# Patient Record
Sex: Female | Born: 1935 | Race: White | Hispanic: No | Smoking: Former smoker
Health system: Southern US, Community
[De-identification: ages and names within clinical notes are randomized; demographics above are authoritative.]

## PROBLEM LIST (undated history)

## (undated) DIAGNOSIS — I10 Essential (primary) hypertension: Secondary | ICD-10-CM

## (undated) DIAGNOSIS — IMO0002 Reserved for concepts with insufficient information to code with codable children: Secondary | ICD-10-CM

## (undated) DIAGNOSIS — Z95828 Presence of other vascular implants and grafts: Secondary | ICD-10-CM

## (undated) DIAGNOSIS — K222 Esophageal obstruction: Secondary | ICD-10-CM

## (undated) DIAGNOSIS — R0989 Other specified symptoms and signs involving the circulatory and respiratory systems: Secondary | ICD-10-CM

## (undated) DIAGNOSIS — I719 Aortic aneurysm of unspecified site, without rupture: Secondary | ICD-10-CM

## (undated) DIAGNOSIS — G43909 Migraine, unspecified, not intractable, without status migrainosus: Secondary | ICD-10-CM

## (undated) DIAGNOSIS — Z86718 Personal history of other venous thrombosis and embolism: Secondary | ICD-10-CM

## (undated) DIAGNOSIS — M109 Gout, unspecified: Secondary | ICD-10-CM

## (undated) DIAGNOSIS — K859 Acute pancreatitis without necrosis or infection, unspecified: Secondary | ICD-10-CM

## (undated) DIAGNOSIS — E785 Hyperlipidemia, unspecified: Secondary | ICD-10-CM

## (undated) DIAGNOSIS — I253 Aneurysm of heart: Secondary | ICD-10-CM

## (undated) DIAGNOSIS — H269 Unspecified cataract: Secondary | ICD-10-CM

## (undated) DIAGNOSIS — I639 Cerebral infarction, unspecified: Secondary | ICD-10-CM

## (undated) DIAGNOSIS — I6529 Occlusion and stenosis of unspecified carotid artery: Secondary | ICD-10-CM

## (undated) DIAGNOSIS — M545 Low back pain, unspecified: Secondary | ICD-10-CM

## (undated) DIAGNOSIS — I219 Acute myocardial infarction, unspecified: Secondary | ICD-10-CM

## (undated) DIAGNOSIS — K219 Gastro-esophageal reflux disease without esophagitis: Secondary | ICD-10-CM

## (undated) DIAGNOSIS — H5440 Blindness, one eye, unspecified eye: Secondary | ICD-10-CM

## (undated) DIAGNOSIS — K279 Peptic ulcer, site unspecified, unspecified as acute or chronic, without hemorrhage or perforation: Secondary | ICD-10-CM

## (undated) HISTORY — DX: Aneurysm of heart: I25.3

## (undated) HISTORY — DX: Acute pancreatitis without necrosis or infection, unspecified: K85.90

## (undated) HISTORY — DX: Migraine, unspecified, not intractable, without status migrainosus: G43.909

## (undated) HISTORY — DX: Essential (primary) hypertension: I10

## (undated) HISTORY — DX: Presence of other vascular implants and grafts: Z95.828

## (undated) HISTORY — PX: STOMACH SURGERY: SHX791

## (undated) HISTORY — PX: APPENDECTOMY: SHX54

## (undated) HISTORY — DX: Esophageal obstruction: K22.2

## (undated) HISTORY — DX: Gastro-esophageal reflux disease without esophagitis: K21.9

## (undated) HISTORY — DX: Other specified symptoms and signs involving the circulatory and respiratory systems: R09.89

## (undated) HISTORY — PX: CORONARY ARTERY BYPASS GRAFT: SHX141

## (undated) HISTORY — DX: Acute myocardial infarction, unspecified: I21.9

## (undated) HISTORY — DX: Cerebral infarction, unspecified: I63.9

## (undated) HISTORY — DX: Low back pain, unspecified: M54.50

## (undated) HISTORY — DX: Hyperlipidemia, unspecified: E78.5

## (undated) HISTORY — DX: Aortic aneurysm of unspecified site, without rupture: I71.9

## (undated) HISTORY — DX: Occlusion and stenosis of unspecified carotid artery: I65.29

## (undated) HISTORY — DX: Peptic ulcer, site unspecified, unspecified as acute or chronic, without hemorrhage or perforation: K27.9

## (undated) HISTORY — DX: Reserved for concepts with insufficient information to code with codable children: IMO0002

## (undated) HISTORY — DX: Blindness, one eye, unspecified eye: H54.40

## (undated) HISTORY — DX: Unspecified cataract: H26.9

## (undated) HISTORY — PX: ABDOMINAL HYSTERECTOMY: SHX81

## (undated) HISTORY — DX: Low back pain: M54.5

## (undated) HISTORY — PX: EYE SURGERY: SHX253

---

## 1964-12-10 HISTORY — PX: PARTIAL HYSTERECTOMY: SHX80

## 1991-12-11 HISTORY — PX: LUMBAR DISC SURGERY: SHX700

## 1994-12-10 HISTORY — PX: ESOPHAGEAL DILATION: SHX303

## 1998-04-04 ENCOUNTER — Inpatient Hospital Stay (HOSPITAL_COMMUNITY): Admission: EM | Admit: 1998-04-04 | Discharge: 1998-04-23 | Payer: Self-pay | Admitting: *Deleted

## 1998-04-09 HISTORY — PX: OTHER SURGICAL HISTORY: SHX169

## 1998-06-09 HISTORY — PX: AORTA - BILATERAL FEMORAL ARTERY BYPASS GRAFT: SHX1175

## 1998-06-14 ENCOUNTER — Ambulatory Visit (HOSPITAL_COMMUNITY): Admission: RE | Admit: 1998-06-14 | Discharge: 1998-06-14 | Payer: Self-pay | Admitting: *Deleted

## 1998-06-27 ENCOUNTER — Inpatient Hospital Stay (HOSPITAL_COMMUNITY): Admission: RE | Admit: 1998-06-27 | Discharge: 1998-07-02 | Payer: Self-pay | Admitting: *Deleted

## 1999-03-29 ENCOUNTER — Encounter: Admission: RE | Admit: 1999-03-29 | Discharge: 1999-06-27 | Payer: Self-pay | Admitting: Family Medicine

## 2000-05-07 ENCOUNTER — Encounter: Payer: Self-pay | Admitting: Emergency Medicine

## 2000-05-07 ENCOUNTER — Encounter: Payer: Self-pay | Admitting: *Deleted

## 2000-05-07 ENCOUNTER — Inpatient Hospital Stay (HOSPITAL_COMMUNITY): Admission: EM | Admit: 2000-05-07 | Discharge: 2000-05-14 | Payer: Self-pay | Admitting: Emergency Medicine

## 2000-05-08 ENCOUNTER — Encounter: Payer: Self-pay | Admitting: Cardiology

## 2000-05-13 ENCOUNTER — Encounter: Payer: Self-pay | Admitting: Cardiology

## 2001-08-27 ENCOUNTER — Ambulatory Visit (HOSPITAL_COMMUNITY): Admission: RE | Admit: 2001-08-27 | Discharge: 2001-08-27 | Payer: Self-pay | Admitting: Gastroenterology

## 2002-05-27 ENCOUNTER — Encounter: Payer: Self-pay | Admitting: *Deleted

## 2002-05-29 ENCOUNTER — Ambulatory Visit (HOSPITAL_COMMUNITY): Admission: RE | Admit: 2002-05-29 | Discharge: 2002-05-29 | Payer: Self-pay | Admitting: *Deleted

## 2002-06-09 HISTORY — PX: CAROTID ENDARTERECTOMY: SUR193

## 2002-06-10 ENCOUNTER — Inpatient Hospital Stay (HOSPITAL_COMMUNITY): Admission: RE | Admit: 2002-06-10 | Discharge: 2002-06-11 | Payer: Self-pay | Admitting: *Deleted

## 2002-06-10 ENCOUNTER — Encounter: Payer: Self-pay | Admitting: *Deleted

## 2002-06-10 ENCOUNTER — Encounter (INDEPENDENT_AMBULATORY_CARE_PROVIDER_SITE_OTHER): Payer: Self-pay | Admitting: *Deleted

## 2002-10-20 ENCOUNTER — Other Ambulatory Visit: Admission: RE | Admit: 2002-10-20 | Discharge: 2002-10-20 | Payer: Self-pay | Admitting: Family Medicine

## 2003-04-20 ENCOUNTER — Ambulatory Visit (HOSPITAL_COMMUNITY): Admission: RE | Admit: 2003-04-20 | Discharge: 2003-04-20 | Payer: Self-pay | Admitting: Cardiology

## 2003-04-20 ENCOUNTER — Encounter: Payer: Self-pay | Admitting: Cardiology

## 2003-04-20 ENCOUNTER — Encounter (INDEPENDENT_AMBULATORY_CARE_PROVIDER_SITE_OTHER): Payer: Self-pay | Admitting: Cardiology

## 2003-05-05 ENCOUNTER — Ambulatory Visit (HOSPITAL_COMMUNITY): Admission: RE | Admit: 2003-05-05 | Discharge: 2003-05-05 | Payer: Self-pay | Admitting: Cardiology

## 2003-08-03 ENCOUNTER — Ambulatory Visit: Admission: RE | Admit: 2003-08-03 | Discharge: 2003-08-03 | Payer: Self-pay | Admitting: Internal Medicine

## 2003-08-23 ENCOUNTER — Encounter: Payer: Self-pay | Admitting: Family Medicine

## 2003-08-23 ENCOUNTER — Encounter: Payer: Self-pay | Admitting: Emergency Medicine

## 2003-08-24 ENCOUNTER — Encounter: Payer: Self-pay | Admitting: Family Medicine

## 2003-08-24 ENCOUNTER — Inpatient Hospital Stay (HOSPITAL_COMMUNITY): Admission: EM | Admit: 2003-08-24 | Discharge: 2003-08-28 | Payer: Self-pay | Admitting: Emergency Medicine

## 2003-08-25 ENCOUNTER — Encounter: Payer: Self-pay | Admitting: Internal Medicine

## 2003-08-25 ENCOUNTER — Encounter: Payer: Self-pay | Admitting: Family Medicine

## 2003-08-27 ENCOUNTER — Encounter: Payer: Self-pay | Admitting: Family Medicine

## 2003-09-01 ENCOUNTER — Encounter: Admission: RE | Admit: 2003-09-01 | Discharge: 2003-09-01 | Payer: Self-pay | Admitting: Family Medicine

## 2004-01-14 ENCOUNTER — Emergency Department (HOSPITAL_COMMUNITY): Admission: EM | Admit: 2004-01-14 | Discharge: 2004-01-15 | Payer: Self-pay | Admitting: Emergency Medicine

## 2004-01-24 ENCOUNTER — Ambulatory Visit (HOSPITAL_COMMUNITY): Admission: RE | Admit: 2004-01-24 | Discharge: 2004-01-24 | Payer: Self-pay | Admitting: Gastroenterology

## 2004-02-03 ENCOUNTER — Ambulatory Visit (HOSPITAL_COMMUNITY): Admission: RE | Admit: 2004-02-03 | Discharge: 2004-02-03 | Payer: Self-pay | Admitting: Gastroenterology

## 2004-02-21 ENCOUNTER — Inpatient Hospital Stay (HOSPITAL_COMMUNITY): Admission: RE | Admit: 2004-02-21 | Discharge: 2004-02-25 | Payer: Self-pay | Admitting: General Surgery

## 2004-02-21 ENCOUNTER — Encounter (INDEPENDENT_AMBULATORY_CARE_PROVIDER_SITE_OTHER): Payer: Self-pay | Admitting: Specialist

## 2004-10-10 ENCOUNTER — Other Ambulatory Visit: Admission: RE | Admit: 2004-10-10 | Discharge: 2004-10-10 | Payer: Self-pay | Admitting: Family Medicine

## 2004-11-08 ENCOUNTER — Inpatient Hospital Stay (HOSPITAL_COMMUNITY): Admission: EM | Admit: 2004-11-08 | Discharge: 2004-11-14 | Payer: Self-pay | Admitting: Emergency Medicine

## 2004-11-08 ENCOUNTER — Ambulatory Visit: Payer: Self-pay | Admitting: Family Medicine

## 2005-07-05 ENCOUNTER — Encounter: Admission: RE | Admit: 2005-07-05 | Discharge: 2005-07-05 | Payer: Self-pay | Admitting: Neurosurgery

## 2005-07-12 ENCOUNTER — Encounter: Admission: RE | Admit: 2005-07-12 | Discharge: 2005-07-12 | Payer: Self-pay | Admitting: *Deleted

## 2007-04-17 ENCOUNTER — Ambulatory Visit: Payer: Self-pay | Admitting: *Deleted

## 2007-04-23 ENCOUNTER — Encounter: Admission: RE | Admit: 2007-04-23 | Discharge: 2007-04-23 | Payer: Self-pay | Admitting: Cardiology

## 2007-05-21 ENCOUNTER — Encounter: Admission: RE | Admit: 2007-05-21 | Discharge: 2007-08-19 | Payer: Self-pay | Admitting: Cardiology

## 2007-10-16 ENCOUNTER — Ambulatory Visit: Payer: Self-pay | Admitting: *Deleted

## 2007-10-30 ENCOUNTER — Encounter: Admission: RE | Admit: 2007-10-30 | Discharge: 2007-10-30 | Payer: Self-pay | Admitting: *Deleted

## 2007-10-30 ENCOUNTER — Ambulatory Visit: Payer: Self-pay | Admitting: *Deleted

## 2007-11-18 ENCOUNTER — Inpatient Hospital Stay (HOSPITAL_COMMUNITY): Admission: AD | Admit: 2007-11-18 | Discharge: 2007-11-20 | Payer: Self-pay | Admitting: *Deleted

## 2007-11-18 ENCOUNTER — Encounter (INDEPENDENT_AMBULATORY_CARE_PROVIDER_SITE_OTHER): Payer: Self-pay | Admitting: *Deleted

## 2007-11-18 ENCOUNTER — Ambulatory Visit: Payer: Self-pay | Admitting: *Deleted

## 2007-11-18 HISTORY — PX: CAROTID ENDARTERECTOMY: SUR193

## 2008-04-28 ENCOUNTER — Encounter: Admission: RE | Admit: 2008-04-28 | Discharge: 2008-04-28 | Payer: Self-pay | Admitting: Family Medicine

## 2008-05-20 ENCOUNTER — Ambulatory Visit: Payer: Self-pay | Admitting: *Deleted

## 2008-11-25 ENCOUNTER — Ambulatory Visit: Payer: Self-pay | Admitting: *Deleted

## 2009-04-29 ENCOUNTER — Encounter: Admission: RE | Admit: 2009-04-29 | Discharge: 2009-04-29 | Payer: Self-pay | Admitting: Family Medicine

## 2009-05-16 ENCOUNTER — Encounter: Admission: RE | Admit: 2009-05-16 | Discharge: 2009-05-16 | Payer: Self-pay | Admitting: Cardiology

## 2009-11-14 ENCOUNTER — Ambulatory Visit: Payer: Self-pay | Admitting: Surgery

## 2010-03-02 ENCOUNTER — Ambulatory Visit: Payer: Self-pay | Admitting: Vascular Surgery

## 2010-03-07 ENCOUNTER — Ambulatory Visit (HOSPITAL_COMMUNITY): Admission: RE | Admit: 2010-03-07 | Discharge: 2010-03-07 | Payer: Self-pay | Admitting: Vascular Surgery

## 2010-03-07 ENCOUNTER — Ambulatory Visit: Payer: Self-pay | Admitting: Vascular Surgery

## 2010-03-10 ENCOUNTER — Inpatient Hospital Stay (HOSPITAL_COMMUNITY): Admission: EM | Admit: 2010-03-10 | Discharge: 2010-03-14 | Payer: Self-pay | Admitting: Emergency Medicine

## 2010-12-21 ENCOUNTER — Ambulatory Visit: Admit: 2010-12-21 | Payer: Self-pay | Admitting: Vascular Surgery

## 2011-01-03 ENCOUNTER — Encounter
Admission: RE | Admit: 2011-01-03 | Discharge: 2011-01-09 | Payer: Self-pay | Source: Home / Self Care | Attending: Orthopedic Surgery | Admitting: Orthopedic Surgery

## 2011-01-03 ENCOUNTER — Encounter
Admission: RE | Admit: 2011-01-03 | Discharge: 2011-01-03 | Payer: Self-pay | Source: Home / Self Care | Attending: Orthopedic Surgery | Admitting: Orthopedic Surgery

## 2011-01-11 ENCOUNTER — Ambulatory Visit: Payer: Medicare Other | Attending: Orthopedic Surgery | Admitting: Physical Therapy

## 2011-01-11 DIAGNOSIS — IMO0001 Reserved for inherently not codable concepts without codable children: Secondary | ICD-10-CM | POA: Insufficient documentation

## 2011-01-11 DIAGNOSIS — R5381 Other malaise: Secondary | ICD-10-CM | POA: Insufficient documentation

## 2011-01-11 DIAGNOSIS — M25519 Pain in unspecified shoulder: Secondary | ICD-10-CM | POA: Insufficient documentation

## 2011-01-11 DIAGNOSIS — M25619 Stiffness of unspecified shoulder, not elsewhere classified: Secondary | ICD-10-CM | POA: Insufficient documentation

## 2011-01-15 ENCOUNTER — Ambulatory Visit: Payer: Medicare Other | Admitting: Physical Therapy

## 2011-01-17 ENCOUNTER — Ambulatory Visit: Payer: Medicare Other | Admitting: Physical Therapy

## 2011-01-23 ENCOUNTER — Ambulatory Visit: Payer: Medicare Other | Admitting: Physical Therapy

## 2011-01-25 ENCOUNTER — Ambulatory Visit: Payer: Medicare Other | Admitting: Physical Therapy

## 2011-01-29 ENCOUNTER — Ambulatory Visit: Payer: Medicare Other | Admitting: Physical Therapy

## 2011-01-31 ENCOUNTER — Ambulatory Visit: Payer: Medicare Other | Admitting: Physical Therapy

## 2011-02-05 ENCOUNTER — Ambulatory Visit: Payer: Medicare Other | Admitting: Physical Therapy

## 2011-02-07 ENCOUNTER — Ambulatory Visit: Payer: Medicare Other | Admitting: Physical Therapy

## 2011-02-13 ENCOUNTER — Ambulatory Visit: Payer: Medicare Other | Attending: Orthopedic Surgery | Admitting: Physical Therapy

## 2011-02-13 DIAGNOSIS — IMO0001 Reserved for inherently not codable concepts without codable children: Secondary | ICD-10-CM | POA: Insufficient documentation

## 2011-02-13 DIAGNOSIS — M25619 Stiffness of unspecified shoulder, not elsewhere classified: Secondary | ICD-10-CM | POA: Insufficient documentation

## 2011-02-13 DIAGNOSIS — R5381 Other malaise: Secondary | ICD-10-CM | POA: Insufficient documentation

## 2011-02-13 DIAGNOSIS — M25519 Pain in unspecified shoulder: Secondary | ICD-10-CM | POA: Insufficient documentation

## 2011-02-15 ENCOUNTER — Ambulatory Visit: Payer: Medicare Other | Admitting: Physical Therapy

## 2011-02-19 ENCOUNTER — Ambulatory Visit: Payer: Medicare Other | Admitting: Physical Therapy

## 2011-02-21 ENCOUNTER — Ambulatory Visit: Payer: Medicare Other | Admitting: Physical Therapy

## 2011-02-26 ENCOUNTER — Ambulatory Visit: Payer: Medicare Other | Admitting: Physical Therapy

## 2011-02-28 ENCOUNTER — Ambulatory Visit: Payer: Medicare Other | Admitting: Physical Therapy

## 2011-02-28 LAB — PROTIME-INR
INR: 1.33 (ref 0.00–1.49)
INR: 1.41 (ref 0.00–1.49)
INR: 2.12 — ABNORMAL HIGH (ref 0.00–1.49)
INR: 2.9 — ABNORMAL HIGH (ref 0.00–1.49)
Prothrombin Time: 16.4 seconds — ABNORMAL HIGH (ref 11.6–15.2)
Prothrombin Time: 25.3 seconds — ABNORMAL HIGH (ref 11.6–15.2)
Prothrombin Time: 30.1 seconds — ABNORMAL HIGH (ref 11.6–15.2)

## 2011-02-28 LAB — POCT I-STAT, CHEM 8
Glucose, Bld: 96 mg/dL (ref 70–99)
HCT: 40 % (ref 36.0–46.0)
Hemoglobin: 13.6 g/dL (ref 12.0–15.0)
Potassium: 2.9 mEq/L — ABNORMAL LOW (ref 3.5–5.1)

## 2011-02-28 LAB — BLOOD GAS, ARTERIAL
Bicarbonate: 18.5 mEq/L — ABNORMAL LOW (ref 20.0–24.0)
O2 Content: 2 L/min
Patient temperature: 98.6
pH, Arterial: 7.56 — ABNORMAL HIGH (ref 7.350–7.400)
pO2, Arterial: 102 mmHg — ABNORMAL HIGH (ref 80.0–100.0)

## 2011-02-28 LAB — CBC
HCT: 33 % — ABNORMAL LOW (ref 36.0–46.0)
Hemoglobin: 10.9 g/dL — ABNORMAL LOW (ref 12.0–15.0)
Hemoglobin: 12 g/dL (ref 12.0–15.0)
MCHC: 34 g/dL (ref 30.0–36.0)
MCHC: 34 g/dL (ref 30.0–36.0)
MCHC: 34.2 g/dL (ref 30.0–36.0)
Platelets: 173 10*3/uL (ref 150–400)
RBC: 3.5 MIL/uL — ABNORMAL LOW (ref 3.87–5.11)
RBC: 3.86 MIL/uL — ABNORMAL LOW (ref 3.87–5.11)
RBC: 4.37 MIL/uL (ref 3.87–5.11)
RDW: 14.5 % (ref 11.5–15.5)
RDW: 14.8 % (ref 11.5–15.5)
WBC: 12.5 10*3/uL — ABNORMAL HIGH (ref 4.0–10.5)

## 2011-02-28 LAB — DIFFERENTIAL
Basophils Relative: 2 % — ABNORMAL HIGH (ref 0–1)
Lymphocytes Relative: 8 % — ABNORMAL LOW (ref 12–46)
Lymphs Abs: 1 10*3/uL (ref 0.7–4.0)
Monocytes Relative: 7 % (ref 3–12)
Neutro Abs: 10.4 10*3/uL — ABNORMAL HIGH (ref 1.7–7.7)
Neutrophils Relative %: 83 % — ABNORMAL HIGH (ref 43–77)

## 2011-02-28 LAB — CARDIAC PANEL(CRET KIN+CKTOT+MB+TROPI)
CK, MB: 1.4 ng/mL (ref 0.3–4.0)
CK, MB: 1.5 ng/mL (ref 0.3–4.0)
CK, MB: 1.7 ng/mL (ref 0.3–4.0)
Relative Index: INVALID (ref 0.0–2.5)
Total CK: 41 U/L (ref 7–177)
Total CK: 44 U/L (ref 7–177)
Troponin I: 0.02 ng/mL (ref 0.00–0.06)
Troponin I: 0.03 ng/mL (ref 0.00–0.06)

## 2011-02-28 LAB — COMPREHENSIVE METABOLIC PANEL
AST: 22 U/L (ref 0–37)
Albumin: 2.9 g/dL — ABNORMAL LOW (ref 3.5–5.2)
Calcium: 8.5 mg/dL (ref 8.4–10.5)
Chloride: 114 mEq/L — ABNORMAL HIGH (ref 96–112)
Creatinine, Ser: 1 mg/dL (ref 0.4–1.2)
GFR calc Af Amer: >60 mL/min (ref >60)
Total Protein: 5 g/dL — ABNORMAL LOW (ref 6.0–8.3)

## 2011-02-28 LAB — GLUCOSE, CAPILLARY: Glucose-Capillary: 82 mg/dL (ref 70–99)

## 2011-02-28 LAB — CK TOTAL AND CKMB (NOT AT ARMC): CK, MB: 1.8 ng/mL (ref 0.3–4.0)

## 2011-02-28 LAB — BASIC METABOLIC PANEL
BUN: 13 mg/dL (ref 6–23)
Chloride: 110 mEq/L (ref 96–112)
Creatinine, Ser: 0.83 mg/dL (ref 0.4–1.2)
Glucose, Bld: 94 mg/dL (ref 70–99)

## 2011-02-28 LAB — ANA: Anti Nuclear Antibody(ANA): NEGATIVE

## 2011-03-02 LAB — POCT I-STAT 4, (NA,K, GLUC, HGB,HCT)
HCT: 46 % (ref 36.0–46.0)
Hemoglobin: 15.6 g/dL — ABNORMAL HIGH (ref 12.0–15.0)
Potassium: 4.3 mEq/L (ref 3.5–5.1)
Sodium: 140 mEq/L (ref 135–145)

## 2011-03-02 LAB — PROTIME-INR: Prothrombin Time: 18.8 seconds — ABNORMAL HIGH (ref 11.6–15.2)

## 2011-03-05 ENCOUNTER — Ambulatory Visit: Payer: Medicare Other | Admitting: Physical Therapy

## 2011-03-08 ENCOUNTER — Ambulatory Visit: Payer: Medicare Other | Admitting: Physical Therapy

## 2011-03-12 ENCOUNTER — Ambulatory Visit: Payer: Medicare Other | Attending: Orthopedic Surgery | Admitting: Physical Therapy

## 2011-03-12 DIAGNOSIS — IMO0001 Reserved for inherently not codable concepts without codable children: Secondary | ICD-10-CM | POA: Insufficient documentation

## 2011-03-12 DIAGNOSIS — M25519 Pain in unspecified shoulder: Secondary | ICD-10-CM | POA: Insufficient documentation

## 2011-03-12 DIAGNOSIS — R5381 Other malaise: Secondary | ICD-10-CM | POA: Insufficient documentation

## 2011-03-12 DIAGNOSIS — M25619 Stiffness of unspecified shoulder, not elsewhere classified: Secondary | ICD-10-CM | POA: Insufficient documentation

## 2011-03-14 ENCOUNTER — Ambulatory Visit: Payer: Medicare Other | Admitting: Physical Therapy

## 2011-03-19 ENCOUNTER — Ambulatory Visit: Payer: Medicare Other | Admitting: Physical Therapy

## 2011-03-21 ENCOUNTER — Ambulatory Visit: Payer: Medicare Other | Admitting: Physical Therapy

## 2011-03-26 ENCOUNTER — Ambulatory Visit: Payer: Medicare Other | Admitting: Physical Therapy

## 2011-03-28 ENCOUNTER — Ambulatory Visit: Payer: Medicare Other | Admitting: Physical Therapy

## 2011-04-02 ENCOUNTER — Ambulatory Visit: Payer: Medicare Other | Admitting: Physical Therapy

## 2011-04-04 ENCOUNTER — Ambulatory Visit: Payer: Medicare Other | Admitting: Physical Therapy

## 2011-04-09 ENCOUNTER — Ambulatory Visit: Payer: Medicare Other | Admitting: Physical Therapy

## 2011-04-11 ENCOUNTER — Ambulatory Visit: Payer: Medicare Other | Attending: Orthopedic Surgery | Admitting: Physical Therapy

## 2011-04-11 DIAGNOSIS — IMO0001 Reserved for inherently not codable concepts without codable children: Secondary | ICD-10-CM | POA: Insufficient documentation

## 2011-04-11 DIAGNOSIS — M25619 Stiffness of unspecified shoulder, not elsewhere classified: Secondary | ICD-10-CM | POA: Insufficient documentation

## 2011-04-11 DIAGNOSIS — R5381 Other malaise: Secondary | ICD-10-CM | POA: Insufficient documentation

## 2011-04-11 DIAGNOSIS — M25519 Pain in unspecified shoulder: Secondary | ICD-10-CM | POA: Insufficient documentation

## 2011-04-16 ENCOUNTER — Ambulatory Visit: Payer: Medicare Other | Admitting: Physical Therapy

## 2011-04-18 ENCOUNTER — Ambulatory Visit: Payer: Medicare Other | Admitting: Physical Therapy

## 2011-04-23 ENCOUNTER — Ambulatory Visit: Payer: Medicare Other | Admitting: Physical Therapy

## 2011-04-24 NOTE — Procedures (Signed)
CAROTID DUPLEX EXAM   INDICATION:  Followup carotid artery disease.   HISTORY:  Diabetes:  Cardiac:  MI.  Hypertension:  Yes.  Smoking:  Quit.  Previous Surgery:  Left CEA with DPA and left CCA to SCA bypass graft in  July of 2003.  Redo left CEA with DPA 11/18/2007 by Dr. Madilyn Fireman.  CV History:  Right eye total blindness.  Amaurosis Fugax No, Paresthesias No, Hemiparesis No                                       RIGHT             LEFT  Brachial systolic pressure:         182               184  Brachial Doppler waveforms:         WNL               WNL  Vertebral direction of flow:        Antegrade         Antegrade  DUPLEX VELOCITIES (cm/sec)  CCA peak systolic                   61                M=93  ECA peak systolic                   58 (proximal occlusion)             108  ICA peak systolic                   130               67  ICA end diastolic                   27                15  PLAQUE MORPHOLOGY:                  Calcific with shadow                N/A  PLAQUE AMOUNT:                      Mild              N/A  PLAQUE LOCATION:                    ICA/ECA/CCA       N/A   IMPRESSION:  1. Right ICA shows evidence of 40-59% stenosis with heavy calcific      plaque and acoustic shadowing in the proximal ICA.  2. Left ICA shows no evidence of restenosis status post CEA.  3. Right ECA proximal occlusion with mid/distal fill via collaterals.  4. Patent left CCA to SCA bypass graft.  5. No significant changes from previous study.   ___________________________________________  P. Liliane Bade, M.D.   AS/MEDQ  D:  11/25/2008  T:  11/25/2008  Job:  [id number]

## 2011-04-24 NOTE — Assessment & Plan Note (Signed)
OFFICE VISIT   [name], [name]  DOB:  08/20/1932                                       11/25/2008  YQMVH#:[id number]   The patient continues to follow up with me regarding her cerebrovascular  disease.  She has undergone a left carotid endarterectomy and left  carotid subclavian bypass along with a redo left carotid endarterectomy.  She has had no recent neurologic symptoms.  Denies sensory, motor or  visual deficit.   Carotid Doppler reveals 40 59% right ICA stenosis which is stable.  Left  carotid reveals no evidence of restenosis status post redo  endarterectomy and patent left carotid subclavian bypass.   The patient appears generally well.  No acute distress.  Alert and  oriented.  BP 142/71, pulse 56 per minute.  Soft left carotid bruit  audible.  Cranial nerves intact.  Strength equal bilaterally.  Reflexes  1+.   The patient continues to do well without significant recurrence of  cerebrovascular disease.  We will plan follow-up again in 1 year with  carotid Doppler evaluation.   Balinda Quails, M.D.  Electronically Signed   PGH/MEDQ  D:  11/25/2008  T:  11/26/2008  Job:  [id number]   cc:   Ernestina Penna, M.D.  Georga Hacking, M.D.

## 2011-04-24 NOTE — Procedures (Signed)
CAROTID DUPLEX EXAM   INDICATION:  Followup carotid artery disease.   HISTORY:  Diabetes:  No.  Cardiac:  MI.  Hypertension:  Yes.  Smoking:  Quit.  Previous Surgery:  Left CEA with DPA and left CCA to SCA bypass graft in  July of 2003.  Re-do left CEA with DPA 11/18/2007 by Dr. Madilyn Fireman.  CV History:  Right eye total blindness, last week had 2 days of  migraines and blurry vision.  Amaurosis Fugax No, Paresthesias No, Hemiparesis No                                       RIGHT             LEFT  Brachial systolic pressure:         202               195  Brachial Doppler waveforms:         Triphasic         Triphasic  Vertebral direction of flow:        Antegrade         Antegrade  DUPLEX VELOCITIES (cm/sec)  CCA peak systolic                   57                81  ECA peak systolic                   21 (proximal occlusion)             133  ICA peak systolic                   128               84  ICA end diastolic                   34                19  PLAQUE MORPHOLOGY:                  Calcific          N/A  PLAQUE AMOUNT:                      Mild/moderate     N/A  PLAQUE LOCATION:                    ICA/ECA/CCA       N/A   IMPRESSION:  1. Right ICA shows evidence of 40-59% (low end of range).  2. Left ICA shows no evidence of restenosis status post CEA.  3. Right ECA proximal occlusion, however, fills distally by      collaterals.  4. Patent left CCA to SCA bypass graft.   ___________________________________________  P. Liliane Bade, M.D.   AS/MEDQ  D:  05/20/2008  T:  05/20/2008  Job:  [id number]

## 2011-04-24 NOTE — Assessment & Plan Note (Signed)
OFFICE VISIT   [name], [name]  DOB:  [birth year]                                       03/02/2010  ZOXWR#:[id number]   I saw patient in the office today to evaluate her for possible temporal  artery biopsy.  She was referred by Dr. Christell Constant.  This is a pleasant 77-  year-old woman who states that approximately a month ago she began  experiencing some pain in her left forehead and left temporal area which  radiates down to her left shoulder.  The pain came on gradually, and it  has gradually progressed over the last month.  There really are no  aggravating or alleviating factors except pain medication helps some.  She has been recently started on prednisone, and this has not helped her  pain significantly.  She has had no associated symptoms.  She was sent  for evaluation for a left temporal artery biopsy.   Her past medical history is significant for hypertension and  hypercholesterolemia, both of which have been stable on her current  medications.  She had a previous myocardial infarction back in 1999 with  a left ventricular aneurysm, and she has been on Coumadin since that  time.   On social history, she is married.  Her children are deceased.  She quit  tobacco in 1999.   REVIEW OF SYSTEMS:  CARDIOVASCULAR:  She had no chest pain, chest  pressure, palpitations or arrhythmias.  She does admit to dyspnea on  exertion.  She denies any claudication.  She has had no history of DVT  or phlebitis.  PULMONARY:  She has had no productive cough, bronchitis, asthma, or  wheezing.  GI:  She does have a history of reflux and occasional problems  swallowing.  She has a hiatal hernia.   PHYSICAL EXAMINATION:  This is a pleasant 77 year old woman who appears  her stated age.  Blood pressure is 171/81.  Heart rate is 76.  Temperature is 98.  HEENT:  She has minimal tenderness over the left  temporal artery.  HEENT is otherwise unremarkable.  Lungs are clear  bilaterally to auscultation without rales, rhonchi or wheezing.  On  cardiovascular exam, she has bilateral carotid bruits.  She has a  regular rate and rhythm with a systolic murmur.  She has no significant  peripheral edema.  She has palpable radial pulses.  Neurologic exam:  She has no focal weakness or paresthesias.  Her abdomen is soft and  nontender with normal-pitched bowel sounds.   Of note, she has had a previous left carotid endarterectomy by Dr.  Madilyn Fireman, and her most recent carotid duplex scan, which was done in  December 2010, showed a 40-59% right carotid stenosis with no evidence  of recurrent stenosis on the left.   Given her symptoms, I would agree that temporal artery biopsy is  indicated.  As her symptoms are limited to the left side, I think it  would be reasonable to do a left temporal artery biopsy only.  We have  discussed the procedure and potential complications in the office today.  Of note, she is on Coumadin, and I discussed this today with Dr. Donnie Aho.  We agreed that it would be safe to stop her Coumadin prior to her  surgery without covering her with Lovenox.  We will restart her Coumadin  right after  surgery.  Her biopsy has been scheduled for 03/07/2010.     Di Kindle. Edilia Bo, M.D.  Electronically Signed   CSD/MEDQ  D:  03/02/2010  T:  03/02/2010  Job:  [id number]   cc:   Ernestina Penna, M.D.  Georga Hacking, M.D.

## 2011-04-24 NOTE — Procedures (Signed)
CAROTID DUPLEX EXAM   INDICATION:  Carotid disease.   HISTORY:  Diabetes:  No.  Cardiac:  MI.  Hypertension:  Yes.  Smoking:  Previous.  Previous Surgery:  Left carotid endarterectomy and left CCA to  subclavian artery bypass graft in July, 2003.  Re-do of left carotid  endarterectomy on 11/18/07 by Dr. Madilyn Fireman.  CV History:  Right eye blindness.  Amaurosis Fugax No, Paresthesias No, Hemiparesis No.                                       RIGHT             LEFT  Brachial systolic pressure:         142               148  Brachial Doppler waveforms:         Normal            Normal  Vertebral direction of flow:        Antegrade         Antegrade  DUPLEX VELOCITIES (cm/sec)  CCA peak systolic                   52                69  ECA peak systolic                   34 (proximal occlusion)             65  ICA peak systolic                   129               71  ICA end diastolic                   29                20  PLAQUE MORPHOLOGY:                  Calcific          Mixed  PLAQUE AMOUNT:                      Moderate          Mild  PLAQUE LOCATION:                    ICA/ECA           CCA   IMPRESSION:  1. 40-59% stenosis of the right internal carotid artery.  2. Patent left carotid endarterectomy site with no left internal      carotid artery stenosis.  3. Patent left common carotid to subclavian artery bypass graft with      no evidence of stenosis.  4. Known occlusion of the right proximal external carotid artery,      which is fed distally by collateral circulation.  5. No significant change noted when compared to the previous      examination on 11/25/08.   ___________________________________________  V. Charlena Cross, MD   CH/MEDQ  D:  11/15/2009  T:  11/15/2009  Job:  [id number]

## 2011-04-24 NOTE — Op Note (Signed)
NAMERAYMONDE, HAMBLIN               ACCOUNT NO.:  [id number]   MEDICAL RECORD NO.:  [id number]          PATIENT TYPE:  AMB   LOCATION:  SDS                          FACILITY:  [facility]   PHYSICIAN:  Balinda Quails, M.D.    DATE OF BIRTH:  July 16, 1932   DATE OF PROCEDURE:  11/18/2007  DATE OF DISCHARGE:                               OPERATIVE REPORT   SURGEON:  Balinda Quails, M.D.   ASSISTANT:  RNFA   ANESTHESIA:  General endotracheal.   PREOPERATIVE DIAGNOSIS:  Severe progressive left internal carotid artery  stenosis.   POSTOPERATIVE DIAGNOSIS:  Severe progressive left internal carotid  artery stenosis.   PROCEDURE:  Left carotid endarterectomy with Dacron patch angioplasty.   CLINICAL NOTE:  [name] is a 77 year old female with extensive  vascular disease.  History of previous aortobifemoral bypass and left  carotid subclavian bypass.   She has been followed in the office with known carotid occlusive  disease.  Recently presented with progression of disease by Doppler  evaluation.  This was verified by CT angiogram to reveal a severe  stenosis.  She was brought to the operating room at this time for  planned left carotid endarterectomy.  The patient understands the  potential risks of the operating procedure with major  morbidity/mortality of 1-2% to include but limited to MI, CVA, cranial  nerve injury, and death.   She is brought to the operating room at this time for elective left  carotid endarterectomy.   DESCRIPTION OF PROCEDURE:  The patient was brought to the operating room  in stable condition.  She was placed in supine position.  The left neck  was prepped and draped in a sterile fashion.  General endotracheal  anesthesia induced.  Foley catheter and arterial line in place.   Curvilinear skin incision made along the anterior border of the left  sternocleidomastoid muscle.  Dissection carried down through the  subcutaneous tissue with electrocautery.  The  platysma divided.  Deep  dissection carried down to expose the common carotid artery at the level  of the omohyoid muscle.  Encircled proximally with a vessel loop.  The  vagus nerve reflected posteriorly and preserved.  Distal dissection  carried up to the bulb where the superior thyroid and external carotid  were freed and encircled with vessel loops.  The carotid bulb revealed  severe calcific plaque.  This extended 2-3 cm into the left internal  carotid artery origin.  The distal left internal carotid artery was  freed at the level of the posterior belly of the digastric muscle and  encircled with a vessel loop.  The hypoglossal nerve reflected  superiorly and preserved.   The patient administered 6000 units of heparin intravenously.  Adequate  circulation time permitted.  The carotid vessels were controlled with  clamps.  A longitudinal arteriotomy made in the distal common carotid  artery.  The arteriotomy extended across the carotid bulb and up into  the internal carotid artery.  There was severe hemorrhagic plaque  present in the left carotid bulb and a high-grade left internal carotid  artery  stenosis.  A shunt was inserted.   The endarterectomy then begun with an elevator.  The plaque raised down  to the common carotid artery where it was divided transversely with  Potts scissors.  The plaque raised up in the bulb, and the superior  thyroid and external carotid were endarterectomized using an eversion  technique.  The distal internal carotid artery feathered out well.  Fragments of plaque were removed with fine forceps and the site  irrigated with heparin, saline, and dextran solution.   A patch angioplasty of the endarterectomy site carried out with a  Finesse Dacron patch using running 6-0 Prolene suture.  At completion of  patch angioplasty, the shunt was removed.  All vessels were well-  flushed.  The clamps were removed directly, and initially we had great  flow up the  external carotid artery.  Following this, the internal  artery was released.  Excellent pulse and Doppler signal in the distal  internal carotid artery.   The patient administered 50 mg of protamine intravenously.   Sternocleidomastoid and fascia closed with running 2-0 Vicryl suture.  Platysma closed with running 3-0 Vicryl suture.  The skin was closed  with 4-0 Monocryl.  Dermabond applied.   The patient tolerated the procedure well.  No apparent complications.  Transferred to recovery room in stable condition.      Balinda Quails, M.D.  Electronically Signed     PGH/MEDQ  D:  11/18/2007  T:  11/18/2007  Job:  [id number]   cc:   Georga Hacking, M.D.  [facility]

## 2011-04-24 NOTE — Assessment & Plan Note (Signed)
OFFICE VISIT   [name], [name]  DOB:  [birth year]                                       10/30/2007  ZOXWR#:[id number]   Idalia returns today with the results of her CT angiogram.  This does  reveal a patent left carotid subclavian bypass performed in 2003.  She  has a severe left internal carotid artery stenosis with carotid  bifurcation plaque.  This is consistent with her carotid Doppler  evaluation.  She has moderate right internal carotid artery stenosis.   She is currently on Coumadin and aspirin.   I have suggested we go ahead with a left carotid endarterectomy for  reduction of stroke risk.  She is agreeable to this.  The potential  risks of the operative procedure with the major morbidity and mortality  of 1 to 2% discussed with the patient including MI, CVA, cranial nerve  injury and death.   Marlette appears well.  BP is 122/64, pulse 72 per minute, respirations  18 per minute.  She is alert and oriented, no acute distress.  Left carotid bruit audible  Cranial nerves are intact.  Strength equal bilaterally.   Soraya informs me she has recently had a stress test, we will obtain  the results of this.   Plan to go ahead with a left carotid endarterectomy on December 9 at  [facility].  She will discontinue Coumadin 5 days prior to the  procedure with Lovenox bridging.  Continue aspirin as directed.   Balinda Quails, M.D.  Electronically Signed   PGH/MEDQ  D:  10/30/2007  T:  10/31/2007  Job:  [id number]   cc:   Georga Hacking, M.D.

## 2011-04-24 NOTE — Discharge Summary (Signed)
NAMEINARA, [name]               ACCOUNT NO.:  [id number]   MEDICAL RECORD NO.:  [id number]          PATIENT TYPE:  INP   LOCATION:  3312                         FACILITY:  [facility]   PHYSICIAN:  Balinda Quails, M.D.    DATE OF BIRTH:  [birth year]   DATE OF ADMISSION:  11/18/2007  DATE OF DISCHARGE:  11/20/2007                               DISCHARGE SUMMARY   ADMISSION DIAGNOSIS:  Severe left internal carotid stenosis.   DISCHARGE DIAGNOSES:  1. Severe left internal carotid stenosis.  2. Carotid artery disease.  3. Hypertension.  4. Mitral valve disorder.  5. Renal insufficiency.  6. Myocardial infarction status post anterior.  7. Peripheral vascular disease.  8. Chronic obstructive pulmonary disease.  9. Stroke, ischemic.  10.Hyperlipidemia.  11.Peripheral neuropathy.   CONSULTS:  Pharmacy on November 18, 2007.   PROCEDURES:  Left carotid endarterectomy with Dacron patch angioplasty.   BRIEF H&P:  [name] is a 77 year old female with extensive  vascular disease.  History of previous aorto bifem bypass and left  carotid subclavian bypass.  She has been followed in the office with  known carotid occlusive disease.  Recently, presented with progression  of disease by Doppler evaluation.  This was verified by CT angiogram to  reveal a severe stenosis.  She was brought to the operating room at this  time for planned left carotid endarterectomy.  The patient understood  the potential risk of the operating procedure was major morbidity,  mortality 1% to 2% to include but limited to MI, CVA, cranial nerve  injury, and death.  Patient stated she understood the risk, agreed to  proceed with the procedure, and signed consent.  She was brought to the  operating room for an elective left carotid endarterectomy on November 19, 2007.   HOSPITAL COURSE:  [name] is a 77 year old female with extensive  vascular disease admitted to the hospital on November 19, 2007, for a  left  carotid endarterectomy.  This procedure was performed on November 19, 2007, by Dr. Earl Lites P. Madilyn [name].  The patient remained stable  throughout the procedure.  The patient tolerated the procedure well.  No  apparent complications.  Patient was transferred to recovery room in  stable condition.  After remaining in recovery room for several hours,  patient was then transferred to the floor at 3300 where she continued to  remain stable throughout the night.  On November 19, 2007, postop day  #1, patient's vitals remained stable.  She did have a decreased blood  pressure of 98/31.  Her labs were all within normal range except for her  white blood count was at 17.3.  Patient stated that she had no  complaints but she did have some pain that night but the pain was better  in the morning.  Patient had no difficulty swallowing but it was a  little sore from anesthesiology.  Patient stated she had no changes in  vision, no weakness that was new onset, no nausea, or vomiting.  On  physical examination, patient was unremarkable except some slight  deviation of the tongue  which we feel like was probably due to  retraction of the hypoglossal nerve during procedure and should decease  over time.  Patient also had a little bit of a droop on the right side  of her lower lip, also we feel like this is due to the mandibular margin  being retracted during procedure and that should also proceed to decease  over time.  Patient, at this time, was stable except for her blood  pressure.  Assessment and plan at this time was to continue to monitor  patient's blood pressure, to DC the Foley, have her continue to  ambulate, and possibly increase Foley fluids if needed.  We felt like at  this time patient was not prepared to go home yet until we got her blood  pressure at a better rate.  The patient agreed to this so patient is  indicated to discharge on November 19, 2007, as long as she continues to  remain afebrile  and that her vitals continue to be stable and her blood  pressure increases to a more comfortable level.   DISCHARGE EXAMINATION:  On November 19, 2007, patient states she has no  complaints as stated above.  On physical examination, patient is alert  and oriented x3.  She is lying in bed.  Heart was normal rate, regular  rhythm.  Lungs were clear bilaterally.  Abdomen was soft, nontender with  active bowel sounds and nondistended.  Left incision was clean and dry  with no hematoma and some mild bruising but it looked good and healing  and with no signs of infection.  Also, the left incision was a little  tender upon palpation.  Neuro was intact.  There was no decrease in  strength or any signs of weakness.  She did have a slight deviation of  the tongue as stated above to the right side which we felt was due to  the retraction of the hypoglossal nerve during procedure and this should  decease over time.  Patient does have right eye blindness which is old  and due to a previous stroke.  Blood pressure currently 136/46 upon  entering the room.  Patient's heart rate 78.  Respirations 16.  Temperature 97.2.  O2 at 100.   LABS:  All within normal range except for white blood count.  White  blood count is up to 17.3.  Hemoglobin 11.5, hematocrit 33.0, platelets  266, sodium 140, potassium 4.5, chloride 106, bicarb 24, BUN 17,  creatinine 1.29, and glucose at 151, PT 12.5, INR 0.9.   ASSESSMENT AND PLAN:  To continue to monitor her blood pressure with  hopes of increasing it.  If need be, we will give her some more fluids  but at this time patient's blood pressure is currently rising the more  she begins to ambulate.  We encouraged the patient to ambulate and as  long as her blood pressure is back to normal range the patient will be  DC'd to home on November 20, 2007.  Also, patient is currently on  Coumadin because of previous history of blood clots and she will remain  on Coumadin and go  home on Coumadin.  She will have a followup  appointment made at [facility] to have her PT and INR  followed for probably Monday.  Patient agrees to this plan and is very  satisfied with going home and is ready to do so as soon as possible.   DISCHARGE MEDICATIONS:  1. Lipitor  40 mg a day.  2. Zetia 10 mg daily.  3. Metoprolol 100 mg daily.  4. Protonix 40 mg.  5. Coumadin 4 mg.  6. Amlodipine 5 mg.  7. Triamterene/hydrochlorothiazide 37.5/25 mg.  8. Aspirin 81 mg.  9. Calcium 160 mg.  10.Oxycodone 5 mg 1 to 2 tablets every 4 hours as needed for pain.   DISCHARGE INSTRUCTIONS AND CARE:  As of now, patient is indicated to be  discharged on November 20, 2007.  I discussed with patient to clean her  incisions gently with soap and water.  Also, patient was encouraged to  increase activities slowly and walk with assistance.  She may shower and  bathe starting on November 20, 2007.  Also, patient was instructed no  lifting or driving for up to 2 weeks.  Patient also was instructed to  call for fever greater than 101, any redness, drainage from the  incision, severe headache, speech or visual changes.  Also, patient was  instructed that she is to return to Dr. Madilyn [name] in 2 weeks for a followup.  Patient is to continue home medications including Coumadin.  Patient was  also given a new prescription for oxycodone 5 mg 1 to 2 tablets every 4  hours as needed for pain.  As discussed with patient, also we would call  for an appointment for PT and INR labs to be  followed by [facility] for possibly on Monday; that  appointment will be scheduled.  Patient agreed to these instructions and  stated that she understood them and was okay to be DC'd to home on  November 20, 2007, if she continued to remain stable and afebrile.      Cyndy Freeze, PA      P. Liliane Bade, M.D.  Electronically Signed    ALW/MEDQ  D:  11/19/2007  T:  11/19/2007  Job:  [id number]

## 2011-04-24 NOTE — Assessment & Plan Note (Signed)
OFFICE VISIT   [name], [name]  DOB:  Jan 03, 1932                                       10/16/2007  ZOXWR#:[id number]   The patient has a history of peripheral vascular disease, extracranial  cerebrovascular disease, and heart problems.  She initially presented  back in 1999 with a subacute rupture in her left ventricle and underwent  repair of this, carried out by Dr. Cornelius Moras.  She subsequently had  aortobifemoral bypass with bilateral femoral endarterectomy and  reimplantation of __________  left renal artery, also carried out in  1999.  She has had a left common carotid to subclavian bypass in 2003.  She has known carotid occlusive disease.  She has been asymptomatic.  Denies sensory motor or visual deficit.  No speech or swallowing  problems.  No gait abnormality.   She has had no recent chest pain.  Denies shortness of breath.   VITAL SIGNS:  BP 131/67, pulse 63 per minute, respirations 16 per  minute, O2 saturation 94%.   The patient appears generally well in no distress.  No pallor, cyanosis,  or jaundice.   Her left neck does reveal an easily audible bruit.  She has 2+ brachial  pulses present bilaterally.   Doppler evaluation reveals a progressive left ICA stenosis, now greater  than 80%.  Right ICA reveals 40-59% stenosis.   Left common carotid to subclavian bypass remains patent.   CURRENT MEDICATIONS:  Include Coumadin, Lopressor, Protonix, Diovan,  Zetia, and Lipitor.  She is taking 81 mg of aspirin daily.   The patient shows progression of her left internal carotid artery  stenosis.  I have arranged to have her undergo a CT angiogram of the  neck to further evaluate this.  She will return to the office with the  results of this and further discussion regarding management.   Balinda Quails, M.D.  Electronically Signed   PGH/MEDQ  D:  10/20/2007  T:  10/21/2007  Job:  [id number]

## 2011-04-24 NOTE — Procedures (Signed)
CAROTID DUPLEX EXAM   INDICATION:  Followup, carotid artery disease.   HISTORY:  Diabetes:  No.  Cardiac:  MI in 1999.  Hypertension:  Yes.  Smoking:  Quit.  Previous Surgery:  Left CEA with DPA and left CCA to SCA bypass graft in  July, 2003 by Dr. Madilyn Fireman.  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  Amaurosis Fugax No, Paresthesias No, Hemiparesis No                                       RIGHT             LEFT  Brachial systolic pressure:         153               155  Brachial Doppler waveforms:         Triphasic         Triphasic  Vertebral direction of flow:        Antegrade         Antegrade  DUPLEX VELOCITIES (cm/sec)  CCA peak systolic                   75                89  ECA peak systolic                   30                276  ICA peak systolic                   127               370  ICA end diastolic                   35                123  PLAQUE MORPHOLOGY:                  Calcified         Mixed/calcified  PLAQUE AMOUNT:                      Moderate          Severe  PLAQUE LOCATION:                    ICA/ECA/CCA  ICA/ECA/bifurcation   IMPRESSION:  1. Right internal carotid artery stenosis of 40-59%.  2. Left internal carotid artery stenosis of 80-99%.  3. Right external carotid artery occluded proximally; however, fills      distally via collaterals.  4. Left external carotid artery stenosis.  5. Patent left common carotid artery to subclavian artery bypass      graft.  6. Significant increase from previous study bilaterally (04/17/2007).      Right was 20-39% stenosis.  Left was 60-79% stenosis.   ___________________________________________  P. Liliane Bade, M.D.   AS/MEDQ  D:  10/16/2007  T:  10/17/2007  Job:  [id number]

## 2011-04-24 NOTE — Discharge Summary (Signed)
NAMECARISHA, KANTOR               ACCOUNT NO.:  [id number]   MEDICAL RECORD NO.:  [id number]          PATIENT TYPE:  INP   LOCATION:  3312                         FACILITY:  [facility]   PHYSICIAN:  Balinda Quails, M.D.    DATE OF BIRTH:  05-06-1932   DATE OF ADMISSION:  11/18/2007  DATE OF DISCHARGE:  11/20/2007                               DISCHARGE SUMMARY   ADDENDUM TO DISCHARGE SUMMARY   The patient is currently afebrile and vital signs are stable.  Vital  signs are 152/52 blood pressure, heart rate 72, respirations 15,  temperature 97.2.   LABORATORY DATA:  There are no labs.   The patient states mild pain last night but has improved.  Otherwise she  has no complaints.  She has no nausea, vomiting, no weakness, no  difficulty swallowing.   PHYSICAL EXAMINATION:  GENERAL APPEARANCE: On physical examination upon  entering the room today the patient is alert and oriented x3.  She is  sitting in the chair eating with no difficulty.  She states that she is  not having any pain and is able to swallow her food without any  problems.  HEART:  Normal rate, regular rhythm.  LUNGS:  Clear bilaterally.  ABDOMEN:  Soft.  Incision is clean and dry with no hematoma and signs of  healing with no infection.  NEUROLOGICAL:  Intact.  There is no change in the deviation of her  tongue and also as far as the mild droop in her right lower jaw.   ASSESSMENT/PLAN:  This patient is status post left carotid  endarterectomy postoperative day #2.  She is discharged to go home with  instructions, due to the fact that her blood pressure has improved,  patient has ambulating and states that she is ready to go home also.  The patient agrees to all the followup care instructions that were  previously mentioned in the first discharge summary and she will be  discharged to home as long as she continues to remain afebrile and vital  signs stable.      Cyndy Freeze, PA      P. Liliane Bade, M.D.  Electronically Signed    ALW/MEDQ  D:  11/20/2007  T:  11/20/2007  Job:  [id number]

## 2011-04-24 NOTE — H&P (Signed)
[name], [name]               ACCOUNT NO.:  [id number]   MEDICAL RECORD NO.:  [id number]          PATIENT TYPE:  AMB   LOCATION:  SDS                          FACILITY:  [facility]   PHYSICIAN:  Balinda Quails, M.D.    DATE OF BIRTH:  06/01/1932   DATE OF ADMISSION:  11/18/2007  DATE OF DISCHARGE:                              HISTORY & PHYSICAL   CARDIOLOGIST:  Georga Hacking, M.D.   PRIMARY CARE Kalenna Millett:  [facility].   ADMISSION DIAGNOSIS:  Severe left internal carotid artery stenosis.   HISTORY.:  [name] is a 77 year old female long-term patient of  cardiac and vascular surgery.  In 1999, she presented with a silent  myocardial infarction and ventricular rupture requiring surgery.  She  subsequently has undergone aortobifemoral bypass with reimplantation of  the accessory left renal artery.  He has had a left common carotid to  subclavian bypass in 2003.  She is followed in the office for known  asymptomatic carotid artery disease.  No recent sensory, motor, or  visual deficit.   Recent Doppler evaluation revealed progression of the left internal  carotid artery stenosis.  This was severe.  A CT angiogram verified  these findings, revealed the patent left carotid subclavian bypass.   The patient admitted [facility] at this time for elective left  carotid endarterectomy.   PAST MEDICAL HISTORY:  1. Coronary artery disease status post ventricular rupture with      repair.  2. Peripheral vascular disease.  3. Esophageal stricture.  4. Remote peptic ulcer disease.  5. Osteoporosis.  6. Left pleural effusion.  7. COPD.  8. Hypertension.  9. Hyperlipidemia.  10.Migraine headaches.  11.History of pancreatitis.  12.Low back pain.  13.GERD.   MEDICATIONS:  1. Lipitor 40 mg daily.  2. Zetia 10 mg daily.  3. Metoprolol 100 mg daily.  4. Protonix 40 mg daily.  5. Coumadin 4 mg daily.  6. Amlodipine 5 mg daily.  7.  Triamterene/hydrochlorothiazide 37/25 by mouth daily.  8. Aspirin 81 mg daily.  9. Calcium 100 mg daily.  10.Lovenox 70 mg daily.   ALLERGIES:  ASPIRIN AND IBUPROFEN.   SOCIAL HISTORY:  The patient is married and lives with her husband.  She  has two children who are d deceased.  She has a history of tobacco  abuse, having started at age 87 and quitting approximately ten years  ago.  Denies any alcohol use.  She is retired now, previously employed  in the [employer].   FAMILY HISTORY:  Father died of congestive heart failure.  Mother died  of complications of osteoporosis.  One of her sisters has coronary  artery disease.  A brother has a history of CVA.   REVIEW OF SYSTEMS:  The patient denies any recent weight loss.  No chest  pain or shortness of breath.  No abdominal pain.  Denies cough or sputum  production.   PHYSICAL EXAMINATION:  GENERAL:  Well-appearing 77 year old female.  No  acute distress.  VITAL SIGNS: BP is 120/60, pulse 72 per minute, respirations 18 per  minute.  HEENT:  Mouth and throat clear.  Normocephalic.  Pupils equal and  reactive.  NECK:  Supple.  No thyromegaly or adenopathy.  CARDIOVASCULAR:  Irregular rate and rhythm.  No murmurs.  No gallops or  rubs.  Left carotid bruit.  CHEST:  Equal air entry bilaterally.  Distant breath sounds.  No rales  or rhonchi.  ABDOMEN:  Soft, nontender.  Well-healed surgical scars.  No herniation.  Bowel sounds active, no bruits.  EXTREMITIES:  2+ femoral pulses bilaterally.  No ankle edema.  NEUROLOGIC:  The patient is alert and oriented.  Moves all extremities  to command.  Cranial nerves intact.  Normal strength and reflexes.   IMPRESSION:  1. Severe progressive left internal carotid artery stenosis.  2. Coronary artery disease.  3. Chronic obstructive pulmonary disease.  4. Peripheral vascular disease.  5. Hypertension.  6. Hyperlipidemia.   RECOMMENDATION:  The patient will be admitted electively to  [facility] for left carotid endarterectomy for reduction of stroke risk.  The patient understands the potential risks and complications of the  procedure with a major morbidity/mortality of 1-2%.      Balinda Quails, M.D.  Electronically Signed     PGH/MEDQ  D:  11/18/2007  T:  11/18/2007  Job:  [id number]   cc:   Georga Hacking, M.D.  Balinda Quails, M.D.  [facility]

## 2011-04-25 ENCOUNTER — Ambulatory Visit: Payer: Medicare Other | Admitting: Physical Therapy

## 2011-04-26 ENCOUNTER — Encounter: Payer: Self-pay | Admitting: Family Medicine

## 2011-04-27 NOTE — Op Note (Signed)
NAMENALLELY, YOST                         ACCOUNT NO.:  [id number]   MEDICAL RECORD NO.:  [id number]                   PATIENT TYPE:  INP   LOCATION:  2899                                 FACILITY:  [facility]   PHYSICIAN:  Jimmye Norman III, M.D.               DATE OF BIRTH:  1932-06-20   DATE OF PROCEDURE:  02/21/2004  DATE OF DISCHARGE:                                 OPERATIVE REPORT   PREOPERATIVE DIAGNOSES:  Gallstone pancreatitis.   POSTOPERATIVE DIAGNOSES:  Gallstone pancreatitis.   OPERATION PERFORMED:  Open cholecystectomy with cholangiogram.   SURGEON:  Marta Lamas. Lindie Spruce, M.D.   ASSISTANT:  Rose Phi. Maple Hudson, M.D.   ANESTHESIA:  General endotracheal.   ESTIMATED BLOOD LOSS:  Less than 30 mL.   COMPLICATIONS:  None.   CONDITION:  Stable.   OPERATIVE FINDINGS:  The patient had a normal contour and color gallbladder  with cholangiogram which showed no filling defect and no evidence of  obstruction.  The patient had a large amount of midline adhesions with  multiple fascial defects along the midline.  It was because of these  adhesions that a laparoscopic attempt was aborted and an open procedure done  through the patient's previous right upper quadrant scar.   DESCRIPTION OF PROCEDURE:  The patient was taken to the operating room and  placed on the table in the supine position.  After an adequate endotracheal  anesthetic was administered, the patient was prepped and draped in the usual  sterile manner exposing the midline and the right upper quadrant.  A  supraumbilical longitudinal incision was made initially with a #15 blade and  we dissected down to the midline which had  multiple fascial defects.  It  was through one of these fascial defects that an attempt was made to get  into the peritoneal cavity; however, we encountered a fair amount of  bleeding from what appeared to be omental of preperitoneal vessels.  Because  of this and the likelihood that the patient had  significant right upper  quadrant adhesions from her previous perforated ulcer, no further attempts  were made to open up laparoscopically and we went directly to an open  procedure.  We used a #10 blade to make an incision through the patient's  medial most right costal margin incision.  We took it down to the fascia and  then through the fascia while tenting up on the abdominal wall.  We went  through both the anterior and posterior rectus sheaths which were scarred  and removed some old suture material.  Once we completed this, we were able  to get into the peritoneal cavity safely.   Upon entering the cavity, we identified the gallbladder, grasped it with a  Kelly clamp and then subsequently placed abdominal packs into the peritoneum  overlying the small bowel and the colon on the right side.  We used a Nurse, children's  retractor and removed this out of the way and then subsequently  grabbed a second Kelly onto the infundibulum of the gallbladder, exposing  the peritoneum overlying the triangle of Calot and hepatoduodenal triangle.  We were able to identify the cystic duct and the cystic artery and once we  had done so, we endo clipped the cystic artery doubly on the remaining side  and then transected it.  We encircled and made a window around the cystic  duct and then subsequently while holding up on the gallbladder, made a  cholecystodochotomy through which a Cook catheter was placed and clipped  into position using the Endoclip applier.  We did a cholangiogram which  showed good filling to the duodenum, no filling defects and good proximal  filling.  There was no evidence of obstruction.  We removed the clip holding  the catheter in place and then subsequently endo clipped the remaining  cystic duct triply and then transected it with clips on the proximal side  also.  We then dissected out the gallbladder from the liver bed with minimal  difficulty using electrocautery.  Two  pieces of Surgicel were packed into  the gallbladder bed after electrocautery was used to attain hemostasis.  Once this was done, we removed packs and irrigated with saline for a small  amount of bilious drainage that was allowed to fall into the field and once  this was done, we closed.  No drains were left in place.  We closed in two  layers with a posterior layer of  #1 PDS and an anterior layer of  #1 PDS.  We then closed the skin using stainless steel staples also at the umbilical  site where we attempted to enter initially.  Sterile dressings were applied.                                               Kathrin Ruddy, M.D.    JW/MEDQ  D:  02/21/2004  T:  02/21/2004  Job:  [id number]

## 2011-04-27 NOTE — Op Note (Signed)
NAMERAYLI, WIEDERHOLD                         ACCOUNT NO.:  [id number]   MEDICAL RECORD NO.:  [id number]                   PATIENT TYPE:  AMB   LOCATION:  CARD                                 FACILITY:  [facility]   PHYSICIAN:  Casimiro Needle B. Sherene Sires, M.D. [facility]           DATE OF BIRTH:  06/27/1932   DATE OF PROCEDURE:  08/03/2003  DATE OF DISCHARGE:                                 OPERATIVE REPORT   PROCEDURE:  Fiberoptic bronchoscopy diagnostic with lavage.   HISTORY AND INDICATIONS:  Please see dictated office records on this 77-year-  old white female with unexplained hoarseness and an elevated left  hemidiaphragm.  The procedure was performed in the bronchoscopy suite after  a full discussion of risks, benefits and alternatives and informed consent  was signed.   The initial procedure was simply a fluoroscopy using a sniff test.  The left  diaphragm moved sluggishly but not paradoxically.   After that point, the patient was sedated with 25 mg of IV Demerol and 5 mg  of IV Versed for adequate sedation and cough suppression while being  continuously monitored by surface ECG and oximetry, maintaining adequate  saturations throughout the procedure.   The right naris and oropharynx were liberally anesthetized with 10%  lidocaine spray.  The right naris additionally prepared with 2% lidocaine  jelly.   Using a standard flexible fiberoptic bronchoscope, the right naris was  easily cannulated with good visualization of the entire oropharynx and  larynx.  The cords moved normally, and there were no apparent upper airway  lesions.   Using additional 1% lidocaine as needed, the entire tracheobronchial tree  was explored bilaterally with the following findings.   1. The trachea, carina, and all of the right-sided and left-sided airways     were normal.  2. The left lower lobe did not open widely on inspiration.  There appeared     to be redundant folds present throughout the lower lobe,  partially, in     fact, obstructing the airway at end-expiration.  After inspiration they     opened partially, and there was no focal endobronchial lesion or mucosal     abnormality.  Specifically, I did not see any evidence of neoplasm.   IMPRESSION:  1. No evidence of an explanation for chronic hoarseness.  Both vocal cords     appeared to move normally.  2. Left phrenic nerve appears to be intact, although the left diaphragm     moves sluggishly.  3. The left lower lobe findings appear to be chronic and date back to     postsurgical changes documented on previous     chest x-ray.  A follow-up CT scan certainly can be considered, although     the results appear to be longstanding and probably not completely     reversible.  However, no evidence of endobronchial process to explain the     atelectasis in  the left base.                                               Charlaine Dalton. Sherene Sires, M.D. [facility]    MBW/MEDQ  D:  08/03/2003  T:  08/03/2003  Job:  [id number]   cc:   W. Ashley Royalty., M.D.  [address], [address]  [place]  [place] [address]  Fax: [phone]

## 2011-04-27 NOTE — Procedures (Signed)
[place]  Patient:    JARETZI, DROZ                      MRN: [id number] Proc. Date: 05/10/00 Adm. Date:  [id number] Disc. Date: [id number] Attending:  Norman Clay CC:         Darden Palmer., M.D.                           Procedure Report  PROCEDURE:  Upper endoscopy.  ENDOSCOPIST:  Barbette Hair. Arlyce Dice, M.D.  HISTORY:  The patient is a 77 year old female admitted with acute pancreatitis.  Ultrasound demonstrated a slightly dilated bile duct but no gallbladder stones.  Test is performed to rule out active peptic disease.  INFORMED CONSENT:  Patient provided consent after risks, benefits and alternatives were explained.  MEDICATIONS:  Versed 5 mg, Fentanyl 75 mcg IV.  DESCRIPTION OF PROCEDURE:  Patient was placed in the left lateral decubitus position, administered continuous low-flow oxygen and was placed on pulse oximetry.  The Olympus video gastroscope was inserted under direct vision into the oropharynx and esophagus, after spraying the throat with Cetacaine spray.  FINDINGS:  Normal esophagus, stomach and duodenum.  IMPRESSION:  Normal upper endoscopy.  RECOMMENDATION:  To consider ERCP after coagulopathy has resolved to rule out bile duct stones. DD:  05/10/00 TD:  05/14/00 Job: [id number]

## 2011-04-27 NOTE — H&P (Signed)
[facility]  Patient:    [name], [name]                      MRN: [id number] Adm. Date:  [id number] Attending:  Meade Maw A CC:         Darden Palmer., M.D.                         History and Physical  HISTORY OF PRESENT ILLNESS:  Chalet Kerwin is a 77 year old female with a complicated past medical history.  She initially presented to the emergency room with complaints of severe chest pain radiating to her back, worsened by inspiration.  Of note, Ms. Selmer has a past medical history of aortic stenosis, infrarenal.  History is also significant for status post pericardiocentesis for a left tamponade and is status post left ventricular aneurysm.  The patient presented with complaints of severe chest pain radiating to back, similar in nature to her April 1999 presentation.  From the review of the chart it looks as if the patient may have had a ventricular rupture from an old myocardial infarction, which was thought to be related to a silent myocardial infarction.  During her presentation in April of 1999 she had presented with near-syncope, hypertensive, and ST elevation on her ECG.  She had an extensive work-up at that time including a left heart catheterization. Her left heart catheterization at this time from the right brachial artery, which was performed with some difficulty secondary to a right subclavian stenosis, revealed that her left main had a luminal irregularity.  Her left anterior descending had mild to moderate proximal narrowing.  Her distal LAD could not be visualized.  Her circumflex had luminal irregularity only and the right coronary artery only revealed a moderate 40% stenosis.  Her ventriculogram at this time revealed an apical aneurysm with a clot.  Her ejection fraction was felt to be approximately 30%.  Dr. Cornelius Moras was consulted and she had a linear repair of her left ventricular aneurysm.  She subsequently was started on  Coumadin and according to the patient has been pain-free since this presentation.  Upon arrival to the emergency room she had been given a total of 20 mg of IV morphine and Ativan 1 mg IV with minimal relief of her pain.  The patient had no shortness of breath.  She did continue to have significant nausea but no emesis.  REVIEW OF SYSTEMS:  There have been no fevers, no chills, no pedal edema.  SHe did complain of an unusual sensation in her right thigh.  This was similar to her previous presentation.  She has had no blood per her stools.  She has had no presyncope, no tachyarrhythmias.  PAST MEDICAL HISTORY:  As noted above, is significant for:  1. Near-syncope.  2. Chest pain.  3. Pericardial effusion.  4. Peripheral vascular disease.  5. Peptic ulcer disease, status post surgery for peptic ulcer disease.  6. Anemia.  7. Tobacco abuse.  8. Alcohol abuse.  9. Esophageal strictures. 10. Borderline liver enzyme elevation. 11. Right upper lobe nodule by CT scan.  PAST SURGICAL HISTORY:  Significant for a:  1. Linear repair of the ventricular aneurysm as noted above.  2. Cholecystectomy.  3. Hysterectomy.  4. Status post dilatation of esophageal strictures.  ADMISSION MEDICATIONS:  1. Coumadin, dose unknown.  2. Pepcid 20 mg p.o. q.h.s.  3. Lasix 20 mg p.o. q.d.  4.  Lotensin 10 mg p.o. b.i.d.  5. K-Dur 20 mEq p.o. q.d.  FAMILY HISTORY:  Mother died from osteoporosis.  Father died with possible congestive heart failure.  SOCIAL HISTORY:  She is retired.  She formally worked for [employer].  She has a remote history of tobacco abuse.  No history since April of 1999, and has had no further alcohol use.  PHYSICAL EXAMINATION:  GENERAL:  Physical examination reveals a middle-aged female in moderate distress.  She is very anxious and restless.  VITAL SIGNS:  Her initial blood pressure was 110-112/78.  Heart rate has range from 70-90 beats per minute.  O2  saturation has been more than 90%.  She is noted to have bilateral carotid bruits.  PULMONARY:  Examination reveals breath sounds which are equal and clear to auscultation.  CARDIOVASCULAR:  Examination reveals a regular rate and rhythm.  Normal S1, normal S2.  PMI is somewhat displaced laterally.  There is tenderness to chest wall palpation.  ABDOMEN:  Reveals hyperactive bowel sounds.  No abdominal masses were noted.  EXTREMITIES:  Reveal PT pulses to be palpable.  Her radial pulses are very weak bilaterally.  This has been previously noted.  SKIN:  Warm and dry.  NEUROLOGICAL:  Nonfocal.  ECG reveals sinus rhythm with T wave inversion in the inferior and lateral leads.  This was previously noted on her May 1999 ECG.  The patient has also been noted to have ST elevation on her last presentation.  Chest x-ray revealed changes consistent with COPD only.  Her troponin I is less than 0.3.  Her CK total is 78.  MB fraction of 1.3. White count is  10.1, hematocrit is 37.7, platelet count is 292.  Sodium is 138, potassium 3.6, creatinine is 0.9.  INR is 2.7.  A pH is 7.61 with a pCO2 of 26.  IMPRESSION:  Severe chest pain, pleuritic in nature.  Negative cardiac enzymes.  Noncritical disease by a left heart catheterization in May of 1999.  DIFFERENTIAL DIAGNOSES:  Includes possible thrombus, however, the patients INR is 2.7.  Other differential includes pulmonary embolism, pancreatitis, aortic dissection.  PLAN:  A CT scan of the abdomen and chest will be obtained.  Serial cardiac enzymes will be obtained.  We will continue with morphine IV for relief.  Her INR is therapeutic, therefore no indication for heparin.  Will obtain amylase and lipase.  Maintain n.p.o. status until work-up is completed. DD:  05/07/00 TD:  05/08/00 Job: [id number]

## 2011-04-27 NOTE — Discharge Summary (Signed)
NAMEHAYDE, [name]                         ACCOUNT NO.:  [id number]   MEDICAL RECORD NO.:  [id number]                   PATIENT TYPE:  INP   LOCATION:  3704                                 FACILITY:  [facility]   PHYSICIAN:  Leighton Roach McDiarmid, M.D.             DATE OF BIRTH:  [birth year]   DATE OF ADMISSION:  08/23/2003  DATE OF DISCHARGE:  08/28/2003                                 DISCHARGE SUMMARY   DISCHARGE DIAGNOSES:  1. Esophageal stricture.  2. Chronically elevated left hemidiaphragm.  3. Hypoxia.  4. Peripheral vascular disease.   OTHER MEDICAL DIAGNOSES:  1. Status post left thoracentesis for pleural effusion.  2. Status post left carpal tunnel release.  3. Osteoporosis.  4. History of peptic ulcer disease.  5. History of other esophageal strictures, status post multiple dilatations.  6. Chronic obstructive pulmonary disease.  7. Hyperlipidemia.  8. Hypertension.  9. Coronary artery disease, status post silent myocardial infarction.  10.      Left ICA stenosis, left subclavian stenosis.  11.      Aortobifemoral bypass, 1999.  12.      Subacute rupture LV aneurism and aortic occlusion, status post     Cooley repair.   DISCHARGE MEDICATIONS:  1. Toprol XL 100 mg once a day.  2. Lipitor 40 mg every day.  3. Protonix 40 mg every day.  4. Coumadin resume her home schedule.  5. Calcium vitamin D, 600 mg every day.  6. Viactiv chews two per day.  7. Resume all other medications the patient was taking.   DISPOSITION:  Patient discharged to home with home O2 to be used with  activity.   FOLLOWUP:  1. The patient was to keep her previously scheduled appointment at [facility].  2. The patient was to keep her previously scheduled appointment with Dr.     Donnie Aho.  3. The patient was to call Dr. Thurston Hole office to set-up a followup     appointment for one month at the phone number [phone].  4. The patient was to call Dr. Jarold Motto at  [phone].  5. If she had any problems swallowing, she was to set-up an appointment with     [facility].   CONSULTS:  1. Dr. Sherene Sires for pulmonology.  2. Dr. Donnie Aho for cardiology.  3. [facility].   PROCEDURES:  1. Cardiac catheterization.  2. Barium swallow.  3. EGD.  4. Abdominal ultrasound.   BRIEF ADMISSION HISTORY:  This is a 77 year old white female with  significant vascular disease history, an approximate three month history of  shortness of breath for which she is followed by Dr. Sherene Sires.  The patient went  to her followup visit with Dr. Madilyn Fireman, on the morning of admission, who  performed her left carotid endarterectomy.  After the appointment, the  patient felt not quite right.  After lunch on that day, she  started having  nausea, vomiting and an increase in her shortness of breath.  The shortness  of breath persisted and she began to have a left sided substernal chest pain  described as a sharp pressure, radiating to the back, down the left arm and  up to the left side of the neck.  This continued with nausea, vomiting and  diaphoresis.  The patient called EMS and was brought to the [facility] ER.   HOSPITAL COURSE:  PROBLEM #1.  Chest pain.  Considering the patient's  vascular history and cardiac history, Dr. Donnie Aho was called.  The patient  had three sets of negative cardiac enzymes.  No EKG changes.  She had a  cardiac catheterization which showed no significant changes from her cath  done in 1999, so will just have continued medical management and the patient  will continue to followup with Dr. Donnie Aho as an outpatient.   PROBLEM #2.  Shortness of breath.  Considering the patient's chronic left  elevated hemidiaphragm, there was concerned whether there was infection.  The patient had chronic atelectasis of the left lower lobe.  No fever.  No  cough.  No elevation of white blood cell count.  Did not think that this was  secondary to pulmonary  issues.  The patient had been being worked up for a  history of three months of shortness of breath.  The patient required oxygen  while inpatient.  Her O2 sats, at rest, were greater than 88% but with any  activity such as walking, the patient's O2 sats dropped below 88%.  [facility] was consulted to provide the patient's home O2, to be used  with activity.   PROBLEM #3.  Nausea and vomiting.  Considering that the patient was  complaining of nausea and vomiting and some mid epigastric, mid substernal  chest pain, an abdominal ultrasound was ordered and revealed that there were  no gallstones and a normal gallbladder and considering her history of  multiple esophageal dilatations a barium swallow was ordered which showed a  distal esophageal stricture.  At that point GI was consulted, who performed  an EGD with concomitant dilatation.  The patient did not have any  difficulties during the procedure.  It was thought that the patient's  combination of symptoms, the chest pain, the shortness of breath and the  nausea and vomiting were all secondary to her esophageal stricture and it  was noted that the chest pain was particularly after swallowing and with  meals and that the patient was likely getting anxious with this feeling  causing her increased shortness of breath.   PROBLEM #4.  Hypotension.  The patient has a history of hypertension but  during the hospitalization, blood pressures were running on the low side,  specifically 90-100/40-50.  It was initially felt that the patient's  hypotension was secondary to the nitroglycerin given by EMS and then it was  thought that it could be possibly due to her heavy narcotic use history,  initially when she came in her chest pain was managed with morphine IV.  But, before discharge the patient's hypotension resolved and was stable.  At  discharge, her blood pressures were in the 120-130/60-70.   DISCHARGE LABS:  During  hospitalization into re-stratification, the patient  did have a fasting lipid profile done with a total cholesterol of 138,  triglycerides of 139 and HDL of 41 and LDL of 69.      Anastasio Auerbach, MD  Leighton Roach McDiarmid, M.D.    AD/MEDQ  D:  09/05/2003  T:  09/06/2003  Job:  [id number]   cc:   Charlaine Dalton. Sherene Sires, M.D. [facility]   W. Ashley Royalty., M.D.  [address], [address]  [place]  [place] [address]  Fax: [phone]   [facility] [place]

## 2011-04-27 NOTE — Discharge Summary (Signed)
[facility]  Patient:    JULL, HARRAL Visit Number: [id number] MRN: [id number]          Service Type: SUR Location: 3300 [id number] [id number] Attending Physician:  Melvenia Needles Dictated by:   Sherrie George, P.A. Admit Date:  06/10/2002 Discharge Date: 06/11/2002   CC:         Darden Palmer., M.D.   Discharge Summary  DATE OF BIRTH:  [birth year]  ADMISSION DIAGNOSES: 1. Left carotid stenosis and a left subclavian steal. 2. History of coronary artery disease, history of silent myocardial    infarction, status post left ventricular aneurysm repair in 1999. 3. Aortic occlusion/claudication, status post aortofemoral bypass grafting in    1999. 4. Hypertension. 5. Chronic obstructive pulmonary disease with a history of tobacco use through    1999. 6. History of alcohol use. 7. History of esophageal strictures with multiple dilatations. 8. History of peptic ulcer disease. 9. History of osteoporosis.  DISCHARGE DIAGNOSES: 1. Left carotid stenosis and a left subclavian steal. 2. History of coronary artery disease, history of silent myocardial    infarction, status post left ventricular aneurysm repair in 1999. 3. Aortic occlusion/claudication, status post aortofemoral bypass grafting in    1999. 4. Hypertension. 5. Chronic obstructive pulmonary disease with a history of tobacco use through    1999. 6. History of alcohol use. 7. History of esophageal strictures with multiple dilatations. 8. History of peptic ulcer disease. 9. History of osteoporosis.  PROCEDURES:  Left carotid endarterectomy and left carotid subclavian bypass graft using 6-mm Dacron graft, June 10, 2002, Dr. Denman George.  BRIEF HISTORY:  The patient is a 77 year old white female, a medical patient of Dr. Lacretia Nicks. Ashley Royalty., with an extensive medical history.  She presents at this time with complaints of left arm and neck discomfort.  Pain was most  noticeable during exertion, however, the pain has become more constant and radiates to her left neck; she also describes some occasional dizziness.  She has had no syncope or presyncope.  She denies any sensory, motor or visual deficits.  Her current medications include Coumadin, Maxzide, Lipitor, Toprol, Protonix and Lotensin.  Doppler studies were obtained and showed left subclavian stenosis with reversible flow in the left vertebral artery and normal flow in the right upper extremity.  After reviewing these studies, it was Dr. Anthony Sar opinion she should undergo left carotid endarterectomy and left subclavian bypass.  The risks and benefits were discussed in detail and informed operative consent was obtained and she was admitted for elective surgery at this time.  PAST MEDICAL HISTORY:  As noted above.  MEDICATIONS: 1. Coumadin 4 mg q.d. 2. Maxzide 37.25/25 mg q.d. 3. Lipitor 40 mg q.d. 4. Protonix 40 mg q.d. 5. Lotensin 20 mg p.o. b.i.d. 6. Toprol-XL 100 mg q.d.  ALLERGIES:  None known.  For further history and physical, please see the dictated note.  HOSPITAL COURSE:  The patient was admitted, taken to the operating room and underwent bypass as described above.  She tolerated the procedure well and returned to the recovery room in satisfactory condition and neurologically intact.  She was transferred to 3300.  She had a benign postoperative course overnight.  The following a.m., her hemoglobin was 9.3 with hematocrit of 27, white count was 7.7, platelets 185,000.  She was mobilized.  She was able to eat, drink, walk and void without difficulty and was subsequently discharged home.  FOLLOWUP:  She will follow  up with Dr. Madilyn Fireman in two weeks.  DISCHARGE MEDICATIONS:  She is to resume her Coumadin at her preadmission dose with followup by Dr. Donnie Aho in one week.  Additional discharge medications include all of her preadmission medications as listed above.  She is also given  Tylox one to two p.o. q.4h. p.r.n. for pain.  CONDITION ON DISCHARGE:  Improved. Dictated by:   Sherrie George, P.A. Attending Physician:  Melvenia Needles DD:  06/11/02 TD:  06/15/02 Job: [id number]

## 2011-04-27 NOTE — Discharge Summary (Signed)
[name], [name]               ACCOUNT NO.:  [id number]   MEDICAL RECORD NO.:  [id number]            PATIENT TYPE:   LOCATION:                                 FACILITY:   PHYSICIAN:  Leighton Roach McDiarmid, M.D.     DATE OF BIRTH:   DATE OF ADMISSION:  11/08/2004  DATE OF DISCHARGE:  11/14/2004                                 DISCHARGE SUMMARY   ADMISSION DIAGNOSES:  1.  Fever with leukocytosis.  2.  Pleuritic chest pain.  3.  Urinary tract infection.  4.  Peripheral vascular disease.  5.  Chronic obstructive pulmonary disease.  6.  Nausea.  7.  Hypertension.  8.  Acute renal failure.   DISCHARGE DIAGNOSES:  1.  Urosepsis.  2.  Acute renal failure, resolved.  3.  Peripheral vascular disease.  4.  Coronary artery disease.  5.  Hypertension.  6.  Chronic obstructive pulmonary disease.   MEDICATIONS AT DISCHARGE:  1.  Alprazolam 500 mg one tablet p.o. q.6h.  2.  Coumadin 4 mg p.o. daily.  3.  Toprol 50 mg p.o. daily.  4.  Diovan 25/160 mg one tablet p.o. daily.  5.  Lipitor one tablet p.o. daily.  6.  Neurontin 100 mg one tablet q.6h.  7.  Protonix 40 mg one tablet p.o. daily.  8.  Zetia 10 mg one tablet p.o. daily.   BRIEF HOSPITAL COURSE:  Ms. Abbasi is a 77 year old lady who was admitted on  November 08, 2004, with possible diagnosis of urinary tract infection with  fever, leukocytosis, pleuritic chest pain, and hypertension, and other  chronic problems that are stable at the moment as peripheral vascular  disease and COPD. Also, having acute renal failure secondary to dehydration.  During hospital course the patient was re-evaluated by her cardiologist, Dr.  Donnie Aho, who ruled out any cardiac origin of chest pain. The patient had  extensive workup in the past with Dr. Donnie Aho and was found out to be stable  at the time of admission and during hospitalization. He was ruled out with  normal cardiac enzymes and EKG that did not show any acute abnormality  unchanged from prior  studies. The patient had blood culture positive for E.  coli so she was diagnosed with E. coli bacteremia, possible source urine  that also grew E. coli more than 100, 10,000 colony per mL. The patient was  treated according to sensitivity. Responded with noticeable improvement of  symptoms to antibiotic therapy.   For the peripheral vascular disease, the patient was continued on Coumadin  the same dosage and deemed therapeutic during hospitalization.   Her renal status improved after hydration and treatment. Her creatinine at  discharge was 1.   The patient was discharged on November 14, 2004, with follow-up appointment  with Dr. Donnie Aho, her cardiologist, and a follow-up appointment with primary  physician.      Adrian Blackwater, MD    ______________________________  Leighton Roach McDiarmid, M.D.    IM/MEDQ  D:  09/30/2005  T:  09/30/2005  Job:  [id number]

## 2011-04-27 NOTE — H&P (Signed)
[name], [name]               ACCOUNT NO.:  [id number]   MEDICAL RECORD NO.:  [id number]          PATIENT TYPE:  INP   LOCATION:  3041                         FACILITY:  [facility]   PHYSICIAN:  Georgina Peer, M.D.DATE OF BIRTH:  Mar 03, 1932   DATE OF ADMISSION:  11/08/2004  DATE OF DISCHARGE:  11/14/2004                                HISTORY & PHYSICAL   PRIMARY CARE Anuradha Chabot:  [facility]   OUTPATIENT CARDIOLOGIST:  Georga Hacking, M.D.   CHIEF COMPLAINT:  Chest pain and nausea.   HISTORY OF PRESENT ILLNESS:  The patient is a 77 year old female with CAD,  PVD, history of left ventricular aneurysm rupture, COPD, who presented to  her primary M.D. complaining of several days of chills and sweats, but  denies fevers.  She also complains of pleuritic chest pain on the left side  of her chest that radiates through to the back, and occurs intermittently  with both rest and exertion.  She also has been having some nausea without  emesis and anorexia for several days.  She notices urinary frequency and a  little dysuria.  She also had gradually increased shortness of breath and  dyspnea on exertion over about 1 week.  She is not on home oxygen, and  denies any cough or sputum.   PAST MEDICAL HISTORY:  1.  CAD status post silent myocardial infarction and ventricular rupture in      1999.  Her last cath was in September 2004, which showed mild to      moderate CAD and mild to moderate pulmonary hypertension with a      preserved EF of 70%.  2.  Peripheral vascular disease with left ICA stenosis and left subclavian      stenosis.  3.  History of esophageal stricture, status post multiple dilations.  4.  Chronically elevated left hemidiaphragm.  5.  History of peptic ulcer disease with perforation.  6.  Osteoporosis.  7.  Status post left thoracentesis for pleural effusion.  8.  COPD.  9.  Hypertension.  10. Hyperlipidemia.  11. Migraines.  12.  History of low back pain.  13. History of urethral stenosis.  14. History of pancreatitis.  15. GERD.   PAST SURGICAL HISTORY:  1.  Aortobifemoral bypass in July 1999 by Dr. Madilyn Fireman.  2.  Aneurysmectomy and repair of ventricular rupture in May 1999.  3.  Left carpal tunnel release.  4.  History of disk surgery in 1993.  5.  Cholecystectomy in March 2005.  6.  Hysterectomy and right oophorectomy in 1966.   MEDICATIONS:  1.  Toprol-XL 100 mg daily.  2.  Coumadin 4 mg daily.  3.  Lipitor 40 mg daily.  4.  Protonix 40 mg daily.  5.  Diovan 160/25 mg daily.  6.  Zetia 10 mg daily.  7.  Viactiv b.i.d.  8.  Neurontin 100 mg q.i.d.   ALLERGIES:  1.  ASPIRIN.  2.  FOSAMAX.  3.  IBUPROFEN.  4.  VIOXX.   SOCIAL HISTORY:  The patient lives with her husband.  Has 2 children  who are  deceased.  She does have a history of tobacco use, having started at age 25  and quitting 6 years ago.  She denies any alcohol.  She was previously  employed in [employer].   FAMILY HISTORY:  Father died of congestive heart failure.  Mother died about  1 year ago with osteoporosis.  Sister has a history of an MI.  Brother had a  CVA.   REVIEW OF SYSTEMS:  The patient had diarrhea earlier the week prior to  admission, as did her husband.  She is independent in ambulation and ADL's  normally, but has felt off balance for the past 2-3 days.   PHYSICAL EXAMINATION:  VITAL SIGNS:  Temperature initially 101.5, decreased  to 100.3 with Tylenol.  Blood pressure initially 90/40, increased to 97/83.  Heart rate 74, respiratory rate 32 initially, decreasing to 18, and O2  saturation 97% on room air.  GENERAL:  She is an elderly white female in no acute distress with the  family at bedside.  Alert and oriented x3.  HEENT:  Pupils equal, round and reactive to light.  Extraocular movements  intact.  Sclerae are white.  Moist mucous membranes.  Oropharynx clear.  NECK:  Supple with no  lymphadenopathy.  CHEST:  Questionable mild left breast/chest wall tenderness to palpation.  CARDIOVASCULAR:  Regular rate and rhythm.  No murmurs, rubs, or gallops.  There were 2+ radial pulses, and 1+ DP pulses bilaterally.  LUNGS:  No increased work of breathing.  Decreased breath sounds in the left  base with 8 crackles on the right base.  ABDOMEN:  Soft, nontender, nondistended, with numerous old surgical scars.  Normal bowel sounds.  EXTREMITIES:  No edema or cyanosis.   LABORATORY DATA:  White count elevated at 12.9 with 88% neutrophils, 5%  lymphocytes, and ANC elevated at 11.4.  Hemoglobin 11.8, hematocrit 33.3,  platelets 216.  MCV 88, RDW 14.  Serum sodium low at 130, potassium 3.3,  chloride 97, bicarb 22, BUN elevated at 40, creatinine 2.1, glucose 109.  Anion gap 11, calcium 8.2, total protein 6, albumin 2.4, alkaline  phosphatase 80, AST 21, ALT 16, total bilirubin 1.0.  PT elevated at 19.6.  INR 2.0, PTT 48.  Point of care enzymes were negative x3 sets.  UA, urine  culture and sensitivity, TSH, blood culture, and BNP are all pending.   IMAGING:  ECG showed normal sinus rhythm at 74 beats per minute with no  acute changes, but evidence of an old lateral infarction.  Chest x-ray  showed no significant change from previous studies, with continued elevation  of left hemidiaphragm.   ASSESSMENT AND PLAN:  A 77 year old white female with coronary artery  disease and chronic obstructive pulmonary disease, who presented with  several days of chills, sweats, pleuritic chest pain, nausea, urinary  frequency, dysuria, and shortness of breath.  1.  Sweats, chills, fever, leukocytosis.  With her increased shortness of      breath and pleuritic chest pain, it is concerning for pneumonia, but no      infiltrate seen on chest x-ray.  Will give IV fluids and recheck chest x-      ray to see if anything __________ out in the morning.  Will check blood     cultures, UA, and urine  cultures.  Tylenol as needed for fever.  Based      on her urine symptoms, she may have a urine infection, so will cover for  this with antibiotics, if her urine is suggestive of infection.  2.  Pleuritic chest pain.  Seen by cardiology already.  Point of care      negative, and no new EKG changes.  Will cycle enzymes and repeat ECG in      the morning.  Her chest pain is possibly secondary to an ammonia, but      again nothing is seen on chest x-ray.  Will repeat in the morning.  The      patient is on Coumadin with a therapeutic INR, so unlikely to have a PE.      The patient is currently on aspirin, as well.  3.  Urine symptoms.  Check urinalysis and culture and sensitivity.  Cover      with antibiotics if urinalysis is suggestive of infection.  4.  Peripheral vascular disease.  The patient is on Coumadin and aspirin.      This is stable.  INR is therapeutic.  Continue Coumadin.  5.  Chronic obstructive pulmonary disease.  The patient is not currently on      any outpatient medications or oxygen.  Will follow for now.  6.  Nausea and anorexia.  Most likely secondary to illness.  Will encourage      p.o. intake and follow.  Anti-emetic as needed.  7.  Fluids, electrolytes, and nutrition.  Continue IV fluids.  Electrolytes      show low sodium and potassium, likely secondary to dehydration.  Will      repeat in the morning.  Heart health diet.  8.  Hypotension, likely secondary to the nitrates she received initially in      the ER.  It has improved with IV fluids.  Follow.  9 . Acute renal failure.  Her baseline creatinine is about 1, so discontinue  Diovan and avoid nonsteroidal anti-inflammatory drugs and nephrotoxic drugs.  Her acute renal failure is likely secondary to decreased p.o. intake and  continued ACE inhibitor.  Will follow creatinine.  1.  Code status.  The patient is a DNI, but does desire cardiac      resuscitation, as long as it is not for an extended period of time  if      she is not quickly revived.       JM/MEDQ  D:  11/14/2004  T:  11/14/2004  Job:  [id number]   cc:   [facility]

## 2011-04-27 NOTE — Consult Note (Signed)
[name], [name]                         ACCOUNT NO.:  [id number]   MEDICAL RECORD NO.:  [id number]                   PATIENT TYPE:  INP   LOCATION:  3704                                 FACILITY:  [facility]   PHYSICIAN:  [name], M.D. [facility]           DATE OF BIRTH:  [birth year]   DATE OF CONSULTATION:  08/24/2003  DATE OF DISCHARGE:                                   CONSULTATION   REQUESTING PHYSICIAN:  [name], M.D.   REASON FOR CONSULTATION:  Unexplained dyspnea.   HISTORY OF PRESENT ILLNESS:  This is an exceptionally complicated, 77-year-  old, white female, a remote smoker, with a history of dyspnea dating back to  March of 2004.  Previously seen at the request of Dr. Donnie [name] with a very  abnormal flow volume loop consistent with vocal cord dysfunction.  Her  workup also suggested chronic left elevated hemidiaphragm that had been  present since at least July of 2003.  Her workup as an outpatient included  eliminating ACE inhibitors and treating her for reflux (because the vocal  dysfunction was suggestive both clinically and by her flow volume loop) and  bronchoscopy with the floor of the left hemidiaphragm within the last two  weeks.  This indicated that her diaphragm was sluggish, but moved downward  on inspiration and she had no endobronchial process.  She is admitted now by  the family practice service because the dyspnea has worsened since  bronchoscopy, but also is now associated with abrupt onset chest pain that  cut off her breath completely (although is not described as classically  pleuritic).  The chest discomfort was in the upper abdomen in the subxiphoid  area and was associated with nausea, vomiting, and diaphoresis.  She was  treated with nitroglycerin initially and this simply dropped her blood  pressure without helping her pain.  She subsequently received morphine,  which seemed to help more than anything else.  She was placed on oxygen,  although I cannot find a saturation on room air to see if she was hypoxemic.  Her workup so far has included basically normal cardiac studies, including a  BNP of less than 100 and negative cardiac enzymes.  CT scan does not show  any pulmonary emboli.  It does show elevated left hemidiaphragm and  atelectatic changes in the left base.   The patient states she is feeling better now, but still feels a little  uncomfortable.  She is has not regained her appetite.  She says that in  retrospect she has been having trouble with foods sticking when she tries to  swallow.  Denies eating anything that may have brought on this spell.   PAST MEDICAL HISTORY:  Significant for:  1. Left ventricular aneurysm repair approximately five years ago.  She is     status post carotid artery surgery in July of 2003  2. Hypertension.  3. Hyperlipidemia.  4. Hiatal hernia that was documented on August 19, 2001, by EGD here     showing stricture present also, consistent with chronic esophagitis.  5. The history indicates that she has had a cholecystectomy.  The patient     denies this, but does state that she has a history of stones, she thinks     maybe in her gallbladder.   ALLERGIES:  None known.   MEDICATIONS:  Listed in the intern's note, but apparently not accurate per  patient.  Here in the hospital she is on:  1. Ecotrin 325 mg daily.  2. Lovenox 10 mg q.12h.  3. MS Contin 30 mg q.12h.  4. Protonix 40 mg one daily.  5. Senokot two tablets daily.  6. Toprol 100 mg daily.  7. Zocor 80 mg daily.   SOCIAL HISTORY:  She quit smoking approximately five years ago.  She denied  any respiratory complaints at the time she quit smoking.  She has no unusual  exposure history.   FAMILY HISTORY:  Positive for emphysema in her father, who is a smoker.  Negative for any former _______________.  The review of systems was reviewed  from the present H&P and essentially I have nothing else to add.   PHYSICAL  EXAMINATION:  GENERAL APPEARANCE:  This is a pleasant white female  in no acute distress.  She is mildly hoarse and she becomes more hoarse the  more she talks.  VITAL SIGNS:  She is afebrile.  Normal vital signs.  HEENT:  Unremarkable.  Oropharynx clear.  Dentition is intact.  NECK:  Supple without cervical adenopathy or tenderness.  Trachea midline.  No thyromegaly.  LUNGS:  Lung fields basically clear bilaterally with minimal decreased  breath sounds in the left base.  Overall air movement, however, is adequate  with no true wheezes and no crackles on inspiration.  HEART:  Regular rate and rhythm with a 1/6 systolic ejection murmur.  ABDOMEN:  Soft and benign with no significant tenderness.  EXTREMITIES:  Warm without calf tenderness, cyanosis, clubbing, or edema.  NEUROLOGIC:  No focal deficits or pathologic reflexes.  SKIN:  Warm and dry.   LABORATORY DATA:  The chest x-ray continues to show elevation of left  hemidiaphragm.  CT was reviewed also and shows no evidence of any  interstitial lung disease or significant pleural effusion.   IMPRESSION:  Chronic dyspnea on exertion with the only leads being  elevated left hemidiaphragm, which dates back over a year and does not  correlate with the new onset dyspnea and a very abnormal flow volume loop  consistent with vocal cord dysfunction which I could not document by  bronchoscopy recently.  A unifying diagnosis I suppose is severe reflux  that is refractory to Protonix causing esophageal spasm and vocal cord  dysfunction, but I would be surprised if this was the cause of her nausea  and vomiting on such high doses of Protonix.  I am going to nevertheless add  a promotility agent to her regimen to see how this helps and also  recommended that an abdominal ultrasound be performed to look for occult  gallbladder disease, which I suppose could mimic ischemia or reflux-induced esophageal spasm, although the discomfort she describes is  more midline than  right sided.   We may end up as we have before with no specific diagnosis here.  I  certainly do not think that the problem she is describing is ischemic in  nature, but I support going  ahead with definitive left heart catheterization  as planned for August 26, 2003.  I do not believe any specific pulmonary  medications are indicated at this point, but recommend incentive spirometry  to reduce to the tendency to the left lower lobe to lose its volume and  might even repeat the fluoroscopy where here to confirm by sniff testing in  fact that the diaphragm is moving reasonably well.   Left lower lobe atelectasis following major thoracic surgery is not uncommon  and I suspect that she may have enough scar tissue and chronic atelectasis  to where we are not likely to see this problem if the lung normalized in  terms of function or structure, but on the other hand this probably dates  back at least four years and is not an obvious cause of her acute  decompensation recently nor her chronic dyspnea.                                               Charlaine Dalton. Sherene [name], M.D. [facility]    MBW/MEDQ  D:  08/25/2003  T:  08/25/2003  Job:  [id number]

## 2011-04-27 NOTE — Cardiovascular Report (Signed)
[name], [name]                         ACCOUNT NO.:  [id number]   MEDICAL RECORD NO.:  [id number]                   PATIENT TYPE:  INP   LOCATION:  3704                                 FACILITY:  [facility]   PHYSICIAN:  W. Ashley Royalty., M.D.         DATE OF BIRTH:  1931/12/19   DATE OF PROCEDURE:  08/26/2003  DATE OF DISCHARGE:                              CARDIAC CATHETERIZATION   HISTORY:  A 77 year old female with a prior history of anterior myocardial  infarction with ventricular rupture treated with aneurysmectomy.  At that  time, she had no significant coronary artery disease, presented with  increasing shortness of breath and has had a negative noninvasive workup and  admitted with severe chest pain and shortness of breath.   PROCEDURE:  1. Right heart catheterization with measurement of cardiac outputs and     pressures.  2. Left heart catheterization with coronary angiograms and left     ventriculogram.  3. Distal aortogram  4. Renal angiogram.   COMMENTS ABOUT PROCEDURE:  The patient tolerated the procedure well without  complications. Versed 2 mg was used for sedation due to anxiety.  Following  the procedure, good peripheral pulses and hemostasis was noted.   HEMODYNAMIC DATA:  Aorta post contrast 98/50.  LV post contrast 98/18-20.  Right heart pressures:  RA equals 18, V equals 18, mean equals 16.  Right ventricle:  42/14-19.  Pulmonary artery 42/22.  Pulmonary capillary  wedge pressure:  A equals 26, B equals 31, mean equals 24.  Cardiac output  four liters per minute.   ANGIOGRAPHIC DATA:   LEFT VENTRICULOGRAM:  Performed in the 30 degree RAO projection.  The aortic  valve appears normal.  The mitral valve appears normal.  The left ventricle  appears normal in size.  Estimated EF is 70%.  The distal apex contracts  vigorously and has two previous areas that likely represent the site of the  previous aneurysm resection.  Coronary arteries arise and  distribute  normally.  There is calcification noted in the proximal left coronary system  and the proximal right coronary artery.  Left main coronary artery appears  normal.   Left anterior descending:  Calcified proximally.  There is a 40% proximal  narrowing noted, but no significant focal obstruction or stenoses are noted.  The left anterior descending is reconstituted and at the apex tapers out.  A  large diagonal branch arises.   Circumflex coronary artery:  Calcified at the mid portion with a segmental  40-50% narrowing.   Right coronary artery:  Calcified proximally and in the mid portion.  There  is mild disease proximally.  There is a 40-50% segmental stenosis in the mid  portion of the right coronary artery.   DISTAL AORTOGRAM:  Reveals patent renal artery on the left.  There is a  slight plaque formation on the renal artery on the right with a good  angiographic result  noted with no severe focal obstruction or stenoses  noted.    IMPRESSION:  1. Preserved left ventricular function with evidence of previous aneurysm     resection in the distal apex, but very minimal akinesis noted.  2. Coronary artery disease with mild to moderate coronary artery disease     involving the circumflex, right coronary artery and left anterior     descending.  3. Mild to moderate pulmonary hypertension.  Elevation of right heart     pressures.  4. Patent distal renal arteries.   RECOMMENDATIONS:  Continued medical therapy.                                                 Darden Palmer., M.D.    WST/MEDQ  D:  08/26/2003  T:  08/27/2003  Job:  [id number]   cc:   Balinda Quails, M.D.  [address]  [place]  [place] [address]   Ernestina Penna, M.D.  [address] [place]  [place] [address]  Fax: [phone]   Charlaine Dalton. Sherene Sires, M.D. [facility]

## 2011-04-27 NOTE — Discharge Summary (Signed)
[place]  Patient:    [name], [name]                      MRN: [id number] Adm. Date:  [id number] Disc. Date: [id number] Attending:  Norman Clay CC:         Monica Becton, M.D. in [place] D. Arlyce Dice, M.D. [facility]             Jimmye Norman, M.D.                           Discharge Summary  CONSULTATIONS:  Jimmye Norman, M.D. and Barbette Hair. Arlyce Dice, M.D.  FINAL DIAGNOSES: 1. Abdominal pain with pancreatitis of uncertain etiology.    a. Possible passed gallstone.    b. Normal ERCP. 2. Coronary artery disease with previous anterior myocardial infarction with    ruptured ventricle.    a. Status post repair.    b. Chronic Coumadin therapy. 3. Previous history of peripheral vascular disease with ______ embolus to    aortobifemoral with repair. 4. History of peptic ulcer disease. 5. History of esophageal stricture. 6. Right upper lobe nodule by CT scan.  PROCEDURES:  ERCP, endoscopy, 2-D echocardiogram.  HISTORY:  This 77 year old woman has a complex past medical history and presented to the emergency room with severe lower chest and epigastric pain radiating to her back.  This pain was very severe and she presented to the emergency room.  It was associated with some nausea and vomiting.  Please see the previously dictated History and Physical for remainder of the details.  HOSPITAL COURSE:  Laboratory studies on admission showed a hemoglobin of 13.3 with hematocrit of 37.7.  Chemistry panel showed a sodium of 138, potassium 3.6.  Liver enzymes were normal, albumin was 3.2.  CPK-MBs and troponins were normal.  Amylase was 88 on admission with lipase of 46 and rose to 342 with a lipase of 340.  Helicobacter antibody is pending at the time of dictation. EKG shows diffuse anterior T wave changes.  ERCP shows slight irregularity of the proximal segment of the pancreatic duct.  A CT scan of the chest showed no evidence of  pulmonary embolism.  There was upper lobe emphysematous changes and bilateral dependent atelectasis.  There was an indeterminate 1 cm right upper lobe pulmonary nodule with follow-up CT scan recommended in four to six months.  There is also a small hiatal hernia, previous aortobifemoral bypass, and no evidence of an acute abdominal process.  A CT scan of the pelvis showed no evidence of DVT or lower extremity problems.  Echocardiogram showed preserved LV function.  There is distal septum apical akinesis in the localized repair previously.  The patient was admitted and was made n.p.o.  She was quite ill on admission and required a good deal of morphine for relief of symptoms.  A PIPIDA was done the next day and showed somewhat delayed filling of the distal bile duct, but no evidence of definite obstruction.  Gallbladder ultrasound did not show any gallstones.  Consultation was had with Dr. Jimmye Norman on May 08, 2000 who strongly suggested a GI evaluation.  The gastroenterologist saw the evaluation and performed endoscopy on May 10, 2000 with findings of acute pancreatitis but there was no definite penetrating ulcer noted.  Her Coumadin was held and they recommended an ERCP.  This was done by Dr. Claudette Head on May 13, 2000 and he found her to have a normal cholangiogram, normal ______ , an ampulla with separate openings for the common bile duct and pancreatic duct.  There is nonerosive gastritis and a distal esophageal stricture.  It was recommended that Protonix be continued and that Carafate be added for gastritis.  There was no etiology found for the mild pancreatitis that was uncovered, and she was to have an H pylori antibody checked.  She continued to have some mild abdominal pain, and is discharged at this time in improved condition.  DISCHARGE MEDICATIONS: 1. Protonix 40 mg daily. 2. Carafate 1 g a.c. and q.h.s. 3. Lipitor 20 mg daily. 4. Aspirin daily. 5. Coumadin to resume  previous dosage. 6. Toprol XL 50 mg daily. 7. Resume Lotensin.  FOLLOW-UP:  She is to follow up in one to two weeks and is to call if there are problems. DD:  05/14/00 TD:  05/16/00 Job: [id number]

## 2011-04-27 NOTE — Procedures (Signed)
[place]. [facility]  Patient:    [name], [name] Visit Number: [id number] MRN: [id number]          Service Type: DSU Location: [facility] 2857 01 Attending Physician:  Melvenia Needles Dictated by:   Denman George, M.D. Proc. Date: 05/29/02 Admit Date:  05/29/2002 Discharge Date: 05/29/2002   CC:         Darden Palmer., M.D.  [facility]   Procedure Report  PHYSICIAN:  Denman George, M.D.  DIAGNOSIS:  Left subclavian steal.  PROCEDURE:  Arch aortogram.  ACCESS:  Left common femoral artery, 5 French sheath.  CONTRAST:  70 ml Visipaque.  COMPLICATIONS:  None apparent.  CLINICAL NOTE:  This is a 77 year old female with a history of well-documented peripheral vascular disease.  Four years ago she underwent emergency surgery for a subacute rupture of the left ventricle and subsequently has undergone aortobifemoral bypassing for severe aortoiliac disease with aortic occlusion.  She has developed symptoms of dizziness associated with left arm pain.  Workup for this revealed evidence of significant left subclavian disease.  She was brought to the catheterization lab at this time for diagnostic arteriography.  DESCRIPTION OF PROCEDURE:  Patient brought to the catheterization lab in stable condition.  Placed in the supine position.  Informed consent obtained. Both groins prepped and draped in a sterile fashion.  Initial attempt was made to access the right limb of the aortobifemoral graft Skin and subcutaneous tissue of the right groin instilled with 1% Xylocaine. Multiple attempts were made to access the right limb.  The right common femoral artery could be accessed; however, the guidewire each time passed into the native circulation and could not be passed into the right limb of the aortobifemoral graft.  Access was then obtained through the left groin.  Skin and subcutaneous tissue instilled  with 1% Xylocaine.  A needle was introduced in the left limb of the aortobifemoral graft, a 0.035 Wholey guidewire was advanced through the needle into the midabdominal aorta.  A 5 French sheath advanced over the guidewire, the sheath flushed with heparin-saline solution.  A standard pigtail catheter was then advanced along with the guidewire into the aortic arch.  A 30 degree LAO projection arch aortogram was obtained. This revealed the ascending aorta to be normal.  The origin of the innominate artery revealed mild tortuosity.  The proximal right subclavian and common carotid arteries were widely patent.  A large dominant right vertebral artery was evident.  The left common carotid origin was normal.  The left carotid bifurcation revealed posterior plaque in the internal carotid artery origin with approximately a 50% stenosis.  The left external iliac artery appeared normal.  The left subclavian artery origin was severely diseased with a long segment of plaque extending from the aortic arch up to the origin of the left vertebral artery.  The left vertebral artery was patent although small and nondominant. The postvertebral left subclavian artery was small in caliber with moderate atherosclerotic irregularity but no dominant stenosis.  A nonselective left subclavian view was obtained.  This verified the proximal left subclavian artery to be severely diseased.  The postvertebral left subclavian artery was patent but small in caliber.  This completed the arteriogram procedure.  The guidewire was reinserted and the pigtail catheter and guidewire were removed.  The left femoral sheath was removed without apparent complications.  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  DISPOSITION:  These results will be reviewed with the patient and family.  It is recommended that the patient not undergo percutaneous intervention due to the  long-segment plaque and close association with the origin of the left vertebral artery.  The patient will require operative left carotid-vertebral bypass. Dictated by:   Denman George, M.D. Attending Physician:  Melvenia Needles DD:  05/29/02 TD:  05/30/02 Job: [id number]

## 2011-04-27 NOTE — H&P (Signed)
[place]. [facility]  Patient:    [name], [name] Visit Number: [id number] MRN: [id number]          Service Type: DSU Location: [facility] 2857 01 Attending Physician:  Melvenia Needles Dictated by:   Loura Pardon, P.A. Admit Date:  05/29/2002 Discharge Date: 05/29/2002                           History and Physical  DATE OF BIRTH: Jul 20, 1932  PRIMARY CAREGIVER: Gweneth Dimitri, M.D.  CARDIOLOGIST: Darden Palmer., M.D.  HISTORY OF PRESENT ILLNESS: Ms. 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  During the work-up, consisting of cardiac catheterization for this problem, she was discovered to have aortic occlusion. She is status post Cooley repair of the left ventricular aneurysm and also aortobifemoral bypass, both done in 1999.  She presents now with left upper extremity pain.  It is most noticeable with exertion.  Doppler ultrasound which was taken Apr 13, 2002 demonstrates left subclavian stenosis with retrograde flow in the left vertebral artery.  She underwent angiography on May 29, 2002 and the study shows left internal carotid artery with a 50% stenosis, left subclavian with severe proximal disease.  After the left vertebral artery the subclavian is small in caliber but patent.  The patients left hand is somewhat cooler than the right.  She complains of numbness and coolness even at rest but this is intermittent.  She also claims that no one could get a blood pressure in the left arm.  PAST MEDICAL HISTORY:  1. Extracranial cerebrovascular occlusive disease with finding of 50% left     internal carotid artery stenosis.  2. History of atherosclerotic coronary artery disease.     a. History of silent MI.  3. History of left ventricular aneurysm.  4. History of aortic occlusion with bilateral lower extremity     claudication.  5. Hypertension.  6. Hyperlipidemia.  7. History of tobacco  habituation, having quit in 1999.  8. Chronic obstructive pulmonary disease.  9. History of ethanol use. 10. History of esophageal stricture, status post multiple dilatations. 11. History of peptic ulcer disease. 12. Osteoporosis.  The patient denies any prior history of diabetes, kidney disease, asthma.  No prior diagnosis of cancer.  PAST SURGICAL HISTORY:  1. Status post Cooley linear repair of left ventricular aneurysm.  2. Status post aortobifemoral bypass of infrarenal aortic occlusion with     reimplantation of the left accessory renal artery by Dr. Denman George in July 1999.  3. Status post left carpel tunnel release.  4. Status post cholecystectomy.  5. Status post left thoracentesis for left pleural effusion.  MEDICATIONS:  1. Chronic Coumadin therapy, 4 mg q.d.  This has been on hold since     June 05, 2002.  2. Maxzide 37.5/25 mg one q.d.  3. Lipitor 40 mg q.d.  4. Protonix 40 mg q.d.  5. Lotensin 20 mg b.i.d.  6. Toprol-XL 100 mg q.d.  ALLERGIES: No known drug allergies.  REVIEW OF SYSTEMS: She is not currently short of breath.  She does not have any history of hemoptysis.  She has no recent history of hematochezia or hemorrhoids.  She has been having a history of presyncope but these episodes have stopped after repair of her left ventricular aneurysm repair.  SOCIAL HISTORY: The patient is married and has  two children, both of whom died in separate motor vehicle accidents.  She is retired currently.  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  She has a history of tobacco habituation, having quit in 1999 at the time of her surgeries.  She used to partake of alcoholic beverages but is not currently using them.  PHYSICAL EXAMINATION:  GENERAL: This is an alert and oriented female in no acute distress.  She exhibits truncal obesity.  She is alert and oriented x3.  VITAL SIGNS: The patient is afebrile.  Blood pressure  on the left is 68/58, on the right is 144/68.  Pulse is 78 and regular.  Respirations 20 and unlabored.  HEENT: Normocephalic, atraumatic.  PERRL.  EOMI.  No evidence of cataracts. She wears both upper and lower dentures.  NECK: Supple without jugular venous distention.  No carotid bruits auscultated.  CHEST: Symmetrical inspiration.  There is a well-healed median sternotomy incision present.  Lungs are clear to auscultation and percussion bilaterally.  HEART: Regular rate and rhythm without murmurs, rubs, or gallops.  ABDOMEN: Soft, slightly obese, nondistended, nontender.  Bowel sounds are present throughout.  There is a well-healed midline laparotomy incision.  EXTREMITIES: No evidence of clubbing or cyanosis.  The left hand is slightly cooler than the right hand.  She has well-healed bilateral groin cicatrices. The right radial artery is absent.  The right ulnar artery is palpable.  The left wrist demonstrates neither a radial or ulnar pulse.  The left hand is cooler than the right hand but there are no ischemic changes present.  Femoral pulses are 3/4 bilaterally.  In the left foot absent pedal pulses, in the right foot there is a palpable posterior tibial pulse.  NEUROLOGIC: Examination without deficit.  Her gait is steady.  Deep tendon reflexes at the patellar region are 2+ bilaterally.  IMPRESSION: Left subclavian stenosis.  PLAN: Left carotid subclavian bypass to be done June 10, 2002, Dr. Denman George surgeon. Dictated by:   Loura Pardon, P.A. Attending Physician:  Melvenia Needles DD:  06/08/02 TD:  06/09/02 Job: [id number]

## 2011-04-27 NOTE — Op Note (Signed)
. [facility]  Patient:    [name], [name] Visit Number: [id number] MRN: [id number]          Service Type: SUR Location: 3300 3301 01 Attending Physician:  Melvenia Needles Dictated by:   Denman George, M.D. Proc. Date: 06/10/02 Admit Date:  06/10/2002 Discharge Date: 06/11/2002   CC:         Darden Palmer., M.D.   Operative Report  SURGEON:  Denman George, M.D.  ASSISTANT: 1. Larina Earthly, M.D. 2. Maxwell Marion, RNFA  ANESTHESIA:  General endotracheal.  ANESTHESIOLOGIST:  Guadalupe Maple, M.D.  PREOPERATIVE DIAGNOSES: 1. Left subclavian stenosis with left upper extremity claudication. 2. Left subclavian steel.  POSTOPERATIVE DIAGNOSES: 1. Left subclavian stenosis with left upper extremity claudication. 2. Left subclavian steel.  PROCEDURE: 1. Left common carotid endarterectomy, Dacron patch angioplasty. 2. Left carotid subclavian bypass with 6 mm Dacron graft.  CLINICAL NOTE:  This is a 77 year old white female with known extensive peripheral vascular disease.  She presented with left upper extremity pain and mild dizziness.  Workup for this revealed left subclavian occlusive disease.  Arteriography verified high grade stenosis extending for a long segment of the proximal left subclavian artery.  She is brought to the operating room at this time for left carotid subclavian bypass.  Risks of this procedure explained to the patient, including the potential risks of a cerebrovascular event of 1%.  DESCRIPTION OF PROCEDURE:  The patient was brought to the operating room in stable condition.  Placed in the supine position.  Left neck and chest prepped and draped in a sterile fashion.  A transverse skin incision was made at the base of the left neck. Subcutaneous tissue divided with electrocautery.  Platysma divided.  The superficial veins were ligated with 3-0 silk and divided.  Lateral head of  the sternocleidomastoid muscle was divided.  The vagus nerve and jugular vein were reflected anteriorly.  The proximal left common carotid artery was mobilized and encircled with a vessiloop.  The phrenic nerve was identified, reflected medially, and preserved.  The scalinus anticus muscle was divided.  The proximal left subclavian artery identified.  The origin of the left internal mammary artery was preserved. The origin of the vertebral artery was preserved.  The left subclavian artery was mobilized in a circle with a vessiloop.  The patient was administered 7000 units of heparin intravenously.  The left common carotid artery was controlled proximally and distally with clamps.  A longitudinal arteriotomy made.  There was a large complex plaque present in the left common carotid artery.  This was endarterectomized.  A patch angioplasty of the left common carotid artery was then carried out with a Finesse patch using running 6-0 Prolene suture.  Following this, the patch was opened longitudinally.  A 6 mm Dacron graft was anastomosed to the patch in an end-to-side fashion using running 6-0 Prolene suture.  The common carotid artery was then flushed through the graft, clamps removed, reperfusing the left brain.  The graft was controlled with a Fogarty clamp.  The left subclavian artery was then controlled proximally and distally with clamps.  A longitudinal arteriotomy made.  The artery was small in caliber and moderately diseased with plaque.  A 6 mm Dacron graft was beveled at appropriate length and anastomosed end-to-side to the proximal left subclavian artery with running 6-0 Prolene suture.  At completion of this, the clamps were removed, excellent flow present.  Adequate hemostasis obtained.  Sponge and instrument counts were correct.  The supraclavicular fat pad was then reapproximated over the graft with interrupted 2-0 Vicryl suture.  The platysma was closed with running  3-0 Vicryl suture.  Skin closed with 4-0 Monocryl, 1/2 inch Steri-Strips applied.  Anesthesia was reversed in the operating room.  The patient awakened readily. Moved all extremities to command.  Transferred to recovery room in stable condition.  The patient did receive 30 mg of Protamine intravenously for reversal of heparin. Dictated by:   Denman George, M.D. Attending Physician:  Melvenia Needles DD:  06/10/02 TD:  06/14/02 Job: [id number]

## 2011-04-27 NOTE — Procedures (Signed)
[place]. [facility]  Patient:    [name], [name]                      MRN: [id number] Proc. Date: 05/13/00 Adm. Date:  [id number] Disc. Date: [id number] Attending:  Norman Clay CC:         Darden Palmer., M.D.             Vania Rea. Jarold Motto, M.D. [facility]             Malcolm T. Pleas Koch., M.D. [facility]                           Procedure Report  PROCEDURE:  Endoscopic retrograde cholangiopancreatogram.  ENDOSCOPIST:  Judie Petit T. Pleas Koch., M.D.  REFERRING PHYSICIAN:  Darden Palmer., M.D.  INDICATIONS:  A 77 year old female hospitalized with epigastric pain, mild liver function tests elevation and acute mild pancreatitis.  Her pancreatitis has resolved clinically, although, she persists with right upper quadrant pain.  Rule out common bile duct stone and other etiologies of pancreatitis.  PHYSICAL EXAMINATION:  CHEST:  Clear to auscultation.  CARDIAC:  Regular rate and rhythm without murmurs.  NEUROLOGIC:  Alert and oriented x 3.  ANESTHESIA:  Fentanyl 100 mcg IV, Versed 12 mg IV, Glucagon 0.5 mg IV and pharyngeal Cetacaine spray.  MONITORING:  Automated blood pressure monitor, pulse oximeter and cardiac monitor.  Low-flow oxygen was given my nasal canal throughout the procedure. The procedure was well-tolerated with no immediate complications.  PROCEDURE:  After the nature of the procedure was discussed with the patient, including discussion of its risks, benefits, and alternatives, she consented to proceed.  She understood the risks of pancreatitis, bleeding, perforation, infection and sedation-related complications.  She was brought to the fluoroscopy suite and comfortably sedated in the prone position. The Olympus video duodenoscope was inserted into the posterior pharynx and esophagus was blindly intubated.  Esophagus was then completely examined.  However, there was a ring-like, benign-appearing stricture at the  esophagogastric junction. Examination of the gastric body revealed marked erythema.  This extended into the gastric antrum.  I did not perform a retroflexed view with the duodenoscope.  The duodenal bulb and descending duodenum appeared unremarkable.  The major ampulla appeared unremarkable, except for two separate openings noted.  The more distal opening cannulated the pancreatic duct.  Contrast was injected revealing slight dilation of the pancreatic duct in the head of the pancreas, however, the remainder of the duct appeared entirely normal.  The more proximal opening was cannulated and the common bile duct was easily entered.  Contrast was injected revealing an essentially normal cholangiogram.  The common bile duct may have been mildly dilated at approximately 8 mm.  There was excellent drainage and no filling defects noted.  The opening for the common bile duct was significantly enlarged enough to allow contrast and bile to flow out around the catheter.   The gallbladder had partially filled as well.  Multiple radiographs were obtained with the assistance of Dr. Roney Marion. Fisher.  The duodenum and stomach were decompressed and the endoscope was withdrawn from the patient.  IMPRESSION: 1. Essentially normal-appearing cholangiogram. 2. Essentially normal-appearing pancreatogram. 3. Major ampulla with separate openings for the pancreatic duct and common    bile duct. 4. Marked nonerosive gastritis. 5. Benign-appearing distal esophageal stricture.  RECOMMENDATIONS: 1. Continue Protonix 40 mg q.day and add Carafate for gastritis.  2. Obtain a serum Helicobacter pylori antibody and treat if positive. 3. No etiology for the mild pancreatitis uncovered.  Also, no etiology for the    abdominal pain noted, except for possibly gastritis. DD:  05/13/00 TD:  05/15/00 Job: [id number]

## 2011-04-27 NOTE — Consult Note (Signed)
NAMESEVYN, [name]                         ACCOUNT NO.:  [id number]   MEDICAL RECORD NO.:  [id number]                   PATIENT TYPE:  INP   LOCATION:  3704                                 FACILITY:  [facility]   PHYSICIAN:  W. Ashley Royalty., M.D.         DATE OF BIRTH:  04-02-1932   DATE OF CONSULTATION:  08/24/2003  DATE OF DISCHARGE:                                   CONSULTATION   HISTORY OF PRESENT ILLNESS:  The patient is a 77 year old female who was  seen at the request of the [facility] for evaluation  of chest pain and shortness of breath. The patient has a very complex past  medical history. She had an acute  anterior myocardial infarction  complicated by ventricular rupture, subacute as well as a saddle embolus to  her aortoiliac bifurcation in 1999. She eventually had catheterization at  that time which showed scattered irregularities in the left main. The LAD  was calcified proximally. The circumflex had scattered irregularities. There  was a 40% right coronary artery stenosis. 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 She underwent  aneurysm  resection by Dr. Cornelius Moras, and several weeks  later underwent aorto bifemoral bypass grafting by Dr. Liliane Bade.   She had done relatively well after recovering from all of this, and  subsequently had done well. She was readmitted with repeat chest pain in  2001. In 2002 she was found to have esophageal  stricture and dilation and  also has chronic  acid reflux with an incompetent lower stricture. She also  is on chronic  Coumadin therapy and has a history of gallstone pancreatitis.  When she passed a gallstone in May of 2001 she had a negative ERCP at that  time. She had left subclavian steel syndrome, and in July 2003 had a left  carotid endarterectomy and a left carotid subclavian bypass graft  using a 6-  mm Dacron graft  at that time. She had had some mild shortness of breath  then.   She had gotten to  feel better  but began to have more significant shortness  of breath this year and over the summer. She complained of dyspnea with  almost any level of  exertion. She had  a Cardiolite stress test on March 16, 2003. She had an echocardiogram  on March 21, 2003 which showed overall  normal left ventricular systolic function with akinesis of the apex. The  right heart  chambers were normal.   I had asked her to see Dr. Sandrea Hughs. She had a set of pulmonary function  tests which were abnormal and had a complete evaluation including  bronchoscopy by Dr. Sherene Sires which did fail to show any signs of cancer. She has  continued to be followed  with dyspnea and this workup is in progress by Dr.  Sherene Sires.   I had also done an MR angiogram of her  on July 06, 2003, which had a  borderline finding of a possible shelf-like plaque at the right renal origin  noted. She had seen Dr. Liliane Bade recently because of this. I contemplated  because of continued significant dyspnea possibly considering  catheterization.   She was in her usual state of health but  continued to complain of mild  dyspnea when she had the onset yesterday of significant anterior chest pain  with some radiation to the back and up into her neck  associated with severe  dyspnea which persisted. She was brought to the emergency room by EMS. An  EKG showed diffuse T-wave abnormalities and in addition she had a BNP  level  of only 49. Subsequent serial enzymes were unremarkable. She was treated  with pain medications and nitroglycerin and became somewhat hypotensive. Her  symptoms improved  somewhat and she was admitted to the hospital  where an  MI was ruled out and I was asked to see her today. She has had discomfort  for around 24 hours now. I should note that at the time of her previous  aneurysm  resection that she did not have coronary artery bypass grafting at  that time.   PAST MEDICAL HISTORY:  1. Hypertension.  2.  Hyperlipidemia.  3. Peripheral vascular disease.  4. Known  history of osteoporosis.  5. Prior history of esophageal  stricture with dilatations.  6. History of reflux.  7. History of gallstone pancreatitis in 2001.   PAST SURGICAL HISTORY:  1. Ventricular aneurysmectomy by Dr. Cornelius Moras in May 1999.  2. Aortobifemoral bypass grafting in July 1999.  3. Left carotid endarterectomy.  4. Left carotid subclavian bypass in July 2003.  5. Cholecystectomy.  6. Hysterectomy.   ALLERGIES:  None.   MEDICATIONS:  See the orders.   FAMILY HISTORY:  She is one of around 15 children. Her mother has recently  died and this  has been a source of situational stress for her. There is a  significantly positive family history of heart disease.   SOCIAL HISTORY:  She is married and lives with her husband at home. She  smoked heavily but quit  smoking  in 1999. She denies substance abuse. She  is retired from [employer] She does not attend church regularly. She has 2  sons. One died accidentally and one son died suddenly previously.   REVIEW OF SYSTEMS:  She has had a weight loss of around 15 to 20 pounds. She  wears eyeglasses and contact lens. She does have a history of gallstone  pancreatitis, esophageal stricture and reflux. She has a history of urinary  frequency and stress incontinence. She has a history of compression  fractures of her vertebra. She has no history of stroke or TIA. She does  have some anxiety and depression also previously.   PHYSICAL EXAMINATION:  GENERAL:  She is a pleasant short statured woman in  no acute distress.  VITAL SIGNS:  Blood pressure 130/80, pulse currently 70.  SKIN:  Warm and dry.  HEENT:  EOMI. PERRLA. Skin is clear. Pharynx negative.  NECK:  Supple without masses. No JVD, no thyromegaly. There  is a previously  healed left carotid endarterectomy scar noted, no bruits. LUNGS:  Increased _________, clear to auscultation and percussion, healed  median  sternotomy scar, healed scar in the left clavicular area.  CARDIAC:  Normal S1 and S2, no S3 or murmur.  ABDOMEN:  Soft, nontender, well healed midline surgical scar.  EXTREMITIES:  Peripheral pulses 2+. Femoral pulses are 2+. No edema  noted.  NEUROLOGIC:  Normal.   LABORATORY DATA:  A 12-lead EKG shows diffuse inferior T-wave inversions  noted.   IMPRESSION:  1. Recent prolonged chest pain with negative enzymes, rule out myocardial     ischemia.  2. Severe dyspnea on exertion with exhaustive pulmonary  workup and previous     noninvasive cardiac  workup, without cardiac ischemia as a cause. The     negative BNP would be somewhat against a diagnosis of congestive heart     failure as a cause.  3. Severe peripheral vascular disease with previous aortobifemoral bypass     grafting and previous left carotid endarterectomy and left carotid     subclavian bypass.  4. Hyperlipidemia.  5. Hypertension, possible renal vascular.  6. History of chronic obstructive pulmonary disease.  7. History of paralyzed left hemidiaphragm previously.  8. History of esophageal  stricture and hiatal hernia with reflux.   RECOMMENDATIONS:  This is a very complex patient at this time. She did  complain of fairly striking dyspnea as well as chest pain. Of note she did  not have her coronary arteries at the time of aneurysm  resection because  this was not critical. My recommendations would be to reverse her Coumadin  and to go ahead with cardiac catheterization. Also will talk to Dr. Liliane Bade about possibly whether we should investigate the renal arteries at the  time of the catheterization. I appreciate seeing her with you and will plan  to do catheterization when her Coumadin is reversed.                                               Darden Palmer., M.D.    WST/MEDQ  D:  08/24/2003  T:  08/24/2003  Job:  [id number]   cc:   Charlaine Dalton. Sherene Sires, M.D. [facility]   Demetrius Charity Liliane Bade, M.D.  [address]   [place]  [place] [address]   Ernestina Penna, M.D.  [address] [place]  [place] [address]  Fax: [phone]

## 2011-04-27 NOTE — Discharge Summary (Signed)
NAMERICCA, MELGAREJO                         ACCOUNT NO.:  [id number]   MEDICAL RECORD NO.:  [id number]                   PATIENT TYPE:  INP   LOCATION:  6711                                 FACILITY:  [facility]   PHYSICIAN:  Jimmye Norman III, M.D.               DATE OF BIRTH:  09/22/1932   DATE OF ADMISSION:  02/21/2004  DATE OF DISCHARGE:  02/25/2004                                 DISCHARGE SUMMARY   DISCHARGE DIAGNOSIS:  Gallstone pancreatitis and cholelithiasis.   PRINCIPAL PROCEDURE:  Open cholecystectomy with cholangiogram. Surgeon was  Dr. Lindie Spruce. Assistant was Dr. Maple Hudson.   DISCHARGE MEDICATIONS:  She was sent home with Vicodin p.r.n. pain.   DIET:  Good.   BRIEF SUMMARY OF HOSPITAL COURSE:  The patient was admitted the day of  surgery on February 21, 2004, for an elective cholecystectomy, possible  laparoscopic cholecystectomy.  Because of previous upper abdominal  surgeries, laparoscopic approach was not possible. She had an open  procedure. This was done uneventfully. She had an intraoperative  cholangiogram which showed no ductal obstruction or pancreatic ductal  obstruction. She was discharged to home after four days, tolerating a diet  well. Her wound looked well and she is to follow up to see Dr. Lindie Spruce in  about three weeks.                                                Kathrin Ruddy, M.D.    JW/MEDQ  D:  03/15/2004  T:  03/15/2004  Job:  [id number]

## 2011-04-27 NOTE — Consult Note (Signed)
NAMETIFFONY, KITE NO.:  [id number]   MEDICAL RECORD NO.:  [id number]          PATIENT TYPE:  EMS   LOCATION:  MAJO                         FACILITY:  [facility]   PHYSICIAN:  Darden Palmer., M.D.DATE OF BIRTH:  07-22-1932   DATE OF CONSULTATION:  11/08/2004  DATE OF DISCHARGE:                                   CONSULTATION   HISTORY:  The patient is a 77 year old female who has a prior history of an  anterior myocardial infarction that was previously treated with  aneurysmectomy in 1999. She developed increasing dyspnea on exertion and  severe chest pain about one year ago. She has had an extensive  hospitalization with catheterization at that time showing 40% to 50%  stenosis of the LAD, 40% to 50% segmental circumflex, and 40% to 50%  segmental right coronary artery through a patent renal artery noted. She has  had preserved LV function. During that hospitalization she was found to have  an esophageal stricture, a chronically elevated left hemidiaphragm, and she  was seen by the pulmonologist. There was concern whether she had atelectasis  and she had desaturation noted, and she was placed on oxygen at that time.  She also had some nausea and vomiting and was found to have an esophageal  stricture. She had been somewhat hypotensive due to nitroglycerin as well as  narcotics. She eventually recovered from that and did well. She did develop  gallstone pancreatitis in March of this year and had a cholecystectomy that  was open. She had done relatively well and had been stable from a cardiac  view point.   She has a four to five day history of feeling poorly with sweating, chills,  and fever with shaking, and she also developed left chest pain which was in  her left chest area and her epigastric area that was somewhat pleuritic and  constant over the past three to four days. She has had some increased  shortness of breath and was seen at [facility] this  morning. She was orthostatic, with low blood pressure, and complaining of  feeling dizzy when she sat up. Because of the chest pain she was sent down  here to the emergency room. She is not currently having chest pain at the  present time, but is severely weak and dizzy when she sits up.   Her past history is complex. She has a prior history of an acute anterior  infarction complicated by ventricular rupture as  well as a saddle embolus,  sacroiliac bifurcation in 1999. She had coronary surgery and eventually  underwent aortobifemoral bypass grafting by Dr. Madilyn Fireman. She has had  esophageal stricture and dilatation in the past and has had chronic acid  reflux with an incompetent lower stricture. She is on chronic Coumadin  therapy. She has had a previous left subclavian steal syndrome in July 2003  and had a left carotid endarterectomy and left subclavian bypass graft using  a 6-mm Dacron graft. She has a prior history of hypertension,  hyperlipidemia, osteoporosis, previous peripheral vascular disease.   Past surgical  history of ventricular aneurysmectomy by Dr. Cornelius Moras in May 1999,  aortobifemoral bypass grafting in July 1999, left carotid endarterectomy,  left subclavian bypass graft in 2003, cholecystectomy, and hysterectomy.   ALLERGIES:  None.   CURRENT MEDICATIONS:  1.  Diovan/HCT 25 mg daily.  2.  Toprol XL 100 mg daily.  3.  Protonix 40 mg daily.  4.  Coumadin daily.  5.  Lipitor 40 mg daily.  6.  Zetia 10 mg daily.   FAMILY AND SOCIAL HISTORY:  Reviewed and are unchanged from a previous  dictation of August 24, 2003.   REVIEW OF SYSTEMS:  She has some chronic dyspnea on exertion. She wears eye  glasses and a history of contact lenses. She has a history of previous  gallstone pancreatitis, esophageal stricture, and reflux. She has a history  of urinary frequency and stress incontinence. There is a history of  compression fractures of her vertebra and  some history of anxiety  previously. She has had extensive evaluations previously.   PHYSICAL EXAMINATION:  GENERAL: She is a pleasant white female who appears  weak.  VITAL SIGNS: Her blood pressure is currently 100/70, pulse currently 80 and  regular.  SKIN: Warm and dry.  HEENT: EOMI. PERRLA. CNS clear. Fundi unremarkable. Oropharynx is negative.  NECK: Supple without masses. There is no thyromegaly. There are bilateral  carotid bruits, left greater than right present.  LUNGS: Rales in the right base. There are decreased breath sounds at the  left base.  CARDIOVASCULAR: Normal S1 and S2. No S3.  ABDOMEN: Soft and nontender. There is no mass, hepatosplenomegaly, or  aneurysm. Midline scar is noted.  EXTREMITIES: Posterior tibial pulses are 1+ bilaterally.  NEUROLOGIC: Normal.  GU/RECTAL EXAM: Deferred due to possible cardiac condition.   A 12-lead EKG shows diffuse T-wave inversions noted which are unchanged from  a previous EKG.   Laboratory data shows a white count of 12,900 with a left shift, hemoglobin  11.8, hematocrit 33.3. Protime is 2.  Sodium is 130, potassium 3.3, glucose  109, BUN 40, creatinine 2.1. Liver enzymes are normal.   IMPRESSION:  1.  The chest pain sounds atypical from a cardiac view point. Serial enzymes      are pending. The description of the chest pain does not sound cardiac      and sounds more respiratory or pulmonary in nature. EKG is abnormal, but      is baseline.  2.  Fever with hypertension, nausea, and vomiting. This most likely      represents either from a pulmonary view point or possibly urinary in      origin.  3.  Coronary artery disease with:      1.  Previous anterior infarct complicated by rupture and previous          aneurysmectomy.      2.  Catheterization done one year ago.  4.  Severe peripheral vascular disease with:      1.  Previous aortobifemoral bypass grafting due to saddle embolus.     2.  Previous left carotid,  subclavian bypass.  5.  Chronic obstructive pulmonary disease with chronically elevated left      hemidiaphragm.  6.  Renal insufficiency, questionable dehydration.  7.  Probable sepsis.  8.  Hyperlipidemia, under treatment.  9.  History of hypertension.   RECOMMENDATIONS:  The patient appears to be septic and somewhat hypotensive.  She should have her blood pressure medications withheld. She should be  cultured and  started on Avelox. I have recommended that we get internal  medicine to see her and consult on her to help with the above issues. I  would agree with rule out myocardial infarction at this time. Check serial  EKGs.      Kristine Royal   WST/MEDQ  D:  11/08/2004  T:  11/08/2004  Job:  [id number]   cc:   [facility]

## 2011-04-30 ENCOUNTER — Ambulatory Visit: Payer: Medicare Other | Admitting: Physical Therapy

## 2011-05-02 ENCOUNTER — Ambulatory Visit: Payer: Medicare Other | Admitting: Physical Therapy

## 2011-05-08 ENCOUNTER — Ambulatory Visit: Payer: Medicare Other | Admitting: Physical Therapy

## 2011-05-09 ENCOUNTER — Other Ambulatory Visit (INDEPENDENT_AMBULATORY_CARE_PROVIDER_SITE_OTHER): Payer: Medicare Other

## 2011-05-09 DIAGNOSIS — Z48812 Encounter for surgical aftercare following surgery on the circulatory system: Secondary | ICD-10-CM

## 2011-05-09 DIAGNOSIS — I6529 Occlusion and stenosis of unspecified carotid artery: Secondary | ICD-10-CM

## 2011-05-11 ENCOUNTER — Ambulatory Visit: Payer: Medicare Other | Attending: Orthopedic Surgery | Admitting: Physical Therapy

## 2011-05-11 DIAGNOSIS — IMO0001 Reserved for inherently not codable concepts without codable children: Secondary | ICD-10-CM | POA: Insufficient documentation

## 2011-05-11 DIAGNOSIS — R5381 Other malaise: Secondary | ICD-10-CM | POA: Insufficient documentation

## 2011-05-11 DIAGNOSIS — M25519 Pain in unspecified shoulder: Secondary | ICD-10-CM | POA: Insufficient documentation

## 2011-05-11 DIAGNOSIS — M25619 Stiffness of unspecified shoulder, not elsewhere classified: Secondary | ICD-10-CM | POA: Insufficient documentation

## 2011-05-14 ENCOUNTER — Ambulatory Visit: Payer: Medicare Other | Admitting: Physical Therapy

## 2011-05-16 ENCOUNTER — Ambulatory Visit: Payer: Medicare Other | Admitting: Physical Therapy

## 2011-05-16 NOTE — Procedures (Unsigned)
CAROTID DUPLEX EXAM  INDICATION:  Follow up carotid stenosis.  HISTORY: Diabetes:  No. Cardiac:  MI. Hypertension:  Yes. Smoking:  Previous. Previous Surgery:  Left carotid endarterectomy and left common carotid artery to subclavian bypass, July 2003.  Re-do left carotid endarterectomy by Dr. Madilyn Fireman on 11/18/2007 and left temporal artery biopsy by Dr. Edilia Bo on 03/07/2010. CV History:  Asymptomatic. Amaurosis Fugax No, Paresthesias No hemiparesis No.                                      RIGHT             LEFT Brachial systolic pressure:         128               120 Brachial Doppler waveforms:         WNL               WNL Vertebral direction of flow:        Antegrade         Antegrade DUPLEX VELOCITIES (cm/sec) CCA peak systolic                   59                106 ECA peak systolic                   17/proximal occlusion               107 ICA peak systolic                   105               89 ICA end diastolic                   20                23 PLAQUE MORPHOLOGY:                  Calcified         Calcified PLAQUE AMOUNT:                      Mild              Mild PLAQUE LOCATION:                    CCA, ICA, ECA     CCA    IMPRESSION: 1. Right common carotid artery disease present of at least 50% with a     peak systolic velocity of 250 cm/s. 2. Known proximal occlusion of the right external carotid artery,     which is fed distally by collateral circulation. 3. Right internal carotid artery stenosis in the 1% to 39% range.     However, velocities unchanged since the previous study on     11/14/2009. 4. Elevated velocities suggestive of hemodynamically significant     stenosis present in the left common carotid artery to subclavian     artery bypass with a peak systolic velocity of 519 cm/s. Velocities     mildly more elevated than obtained in previous studies. 5. Left common carotid artery disease present. 6. Left internal carotid  artery appears patent with a history of     endarterectomy. 7. Increase in disease  of the left common carotid artery to subclavian     bypass graft since previous study on 11/14/2009; however, remainder     is essentially unchanged.  ___________________________________________ Di Kindle. Edilia Bo, M.D.  SH/MEDQ  D:  05/09/2011  T:  05/09/2011  Job:  [id number]

## 2011-05-21 ENCOUNTER — Ambulatory Visit: Payer: Medicare Other | Admitting: Physical Therapy

## 2011-05-23 ENCOUNTER — Ambulatory Visit: Payer: Medicare Other | Admitting: Physical Therapy

## 2011-08-07 ENCOUNTER — Encounter (HOSPITAL_COMMUNITY): Payer: Self-pay | Admitting: *Deleted

## 2011-08-07 ENCOUNTER — Inpatient Hospital Stay (HOSPITAL_COMMUNITY)
Admission: AD | Admit: 2011-08-07 | Discharge: 2011-08-09 | DRG: 812 | Disposition: A | Discharge status: Home / Self Care | Payer: Medicare Other | Source: Ambulatory Visit | Attending: Internal Medicine | Admitting: Internal Medicine

## 2011-08-07 DIAGNOSIS — Z8673 Personal history of transient ischemic attack (TIA), and cerebral infarction without residual deficits: Secondary | ICD-10-CM

## 2011-08-07 DIAGNOSIS — I679 Cerebrovascular disease, unspecified: Secondary | ICD-10-CM | POA: Diagnosis present

## 2011-08-07 DIAGNOSIS — Z8719 Personal history of other diseases of the digestive system: Secondary | ICD-10-CM

## 2011-08-07 DIAGNOSIS — D62 Acute posthemorrhagic anemia: Secondary | ICD-10-CM | POA: Diagnosis present

## 2011-08-07 DIAGNOSIS — J449 Chronic obstructive pulmonary disease, unspecified: Secondary | ICD-10-CM

## 2011-08-07 DIAGNOSIS — I739 Peripheral vascular disease, unspecified: Secondary | ICD-10-CM | POA: Diagnosis present

## 2011-08-07 DIAGNOSIS — Z8679 Personal history of other diseases of the circulatory system: Secondary | ICD-10-CM

## 2011-08-07 DIAGNOSIS — I10 Essential (primary) hypertension: Secondary | ICD-10-CM | POA: Diagnosis present

## 2011-08-07 DIAGNOSIS — R42 Dizziness and giddiness: Secondary | ICD-10-CM | POA: Diagnosis present

## 2011-08-07 DIAGNOSIS — E785 Hyperlipidemia, unspecified: Secondary | ICD-10-CM | POA: Diagnosis present

## 2011-08-07 DIAGNOSIS — T45515A Adverse effect of anticoagulants, initial encounter: Secondary | ICD-10-CM | POA: Diagnosis present

## 2011-08-07 DIAGNOSIS — I253 Aneurysm of heart: Secondary | ICD-10-CM

## 2011-08-07 DIAGNOSIS — I2510 Atherosclerotic heart disease of native coronary artery without angina pectoris: Secondary | ICD-10-CM | POA: Diagnosis present

## 2011-08-07 DIAGNOSIS — Z9889 Other specified postprocedural states: Secondary | ICD-10-CM

## 2011-08-07 DIAGNOSIS — J4489 Other specified chronic obstructive pulmonary disease: Secondary | ICD-10-CM | POA: Diagnosis present

## 2011-08-07 DIAGNOSIS — I119 Hypertensive heart disease without heart failure: Secondary | ICD-10-CM | POA: Diagnosis present

## 2011-08-07 DIAGNOSIS — K222 Esophageal obstruction: Secondary | ICD-10-CM | POA: Diagnosis present

## 2011-08-07 DIAGNOSIS — Z7901 Long term (current) use of anticoagulants: Secondary | ICD-10-CM

## 2011-08-07 DIAGNOSIS — R110 Nausea: Secondary | ICD-10-CM | POA: Diagnosis present

## 2011-08-07 DIAGNOSIS — M7981 Nontraumatic hematoma of soft tissue: Secondary | ICD-10-CM | POA: Diagnosis present

## 2011-08-07 DIAGNOSIS — K219 Gastro-esophageal reflux disease without esophagitis: Secondary | ICD-10-CM | POA: Diagnosis present

## 2011-08-07 LAB — ABO/RH: ABO/RH(D): O POS

## 2011-08-07 LAB — BASIC METABOLIC PANEL
BUN: 20 mg/dL (ref 6–23)
Calcium: 9.4 mg/dL (ref 8.4–10.5)
Creatinine, Ser: 1.07 mg/dL (ref 0.50–1.10)
GFR calc non Af Amer: 50 mL/min — ABNORMAL LOW (ref >60)
Glucose, Bld: 153 mg/dL — ABNORMAL HIGH (ref 70–99)

## 2011-08-07 LAB — CBC
MCH: 31.1 pg (ref 26.0–34.0)
MCHC: 33.3 g/dL (ref 30.0–36.0)
Platelets: 463 10*3/uL — ABNORMAL HIGH (ref 150–400)

## 2011-08-07 LAB — APTT: aPTT: 51 seconds — ABNORMAL HIGH (ref 24–37)

## 2011-08-07 MED ORDER — ACETAMINOPHEN 325 MG PO TABS: 650.0000 mg | ORAL_TABLET | Freq: Four times a day (QID) | ORAL | Status: DC | PRN

## 2011-08-07 MED ORDER — ASPIRIN 81 MG PO CHEW
81.0000 mg | CHEWABLE_TABLET | Freq: Every day | ORAL | Status: DC
  Administered 2011-08-08 – 2011-08-09 (×2): 81 mg via ORAL
  Filled 2011-08-07 (×2): qty 1

## 2011-08-07 MED ORDER — SENNOSIDES-DOCUSATE SODIUM 8.6-50 MG PO TABS: 1.0000 | ORAL_TABLET | Freq: Every day | ORAL | Status: DC | PRN

## 2011-08-07 MED ORDER — SODIUM CHLORIDE 0.9 % IJ SOLN
3.0000 mL | Freq: Two times a day (BID) | INTRAMUSCULAR | Status: DC
  Administered 2011-08-07 – 2011-08-08 (×2): 3 mL via INTRAVENOUS
  Filled 2011-08-07: qty 3

## 2011-08-07 MED ORDER — TRAZODONE HCL 50 MG PO TABS: 50.0000 mg | ORAL_TABLET | Freq: Every evening | ORAL | Status: DC | PRN

## 2011-08-07 MED ORDER — POTASSIUM CHLORIDE 10 MEQ/100ML IV SOLN
10.0000 meq | INTRAVENOUS | Status: AC
  Administered 2011-08-07 (×2): 10 meq via INTRAVENOUS
  Filled 2011-08-07 (×2): qty 100

## 2011-08-07 MED ORDER — OMEGA-3-ACID ETHYL ESTERS 1 G PO CAPS
1.0000 g | ORAL_CAPSULE | Freq: Every day | ORAL | Status: DC
  Administered 2011-08-08 – 2011-08-09 (×2): 1 g via ORAL
  Filled 2011-08-07 (×2): qty 1

## 2011-08-07 MED ORDER — ALUM & MAG HYDROXIDE-SIMETH 200-200-20 MG/5ML PO SUSP: 30.0000 mL | Freq: Four times a day (QID) | ORAL | Status: DC | PRN

## 2011-08-07 MED ORDER — ACETAMINOPHEN 650 MG RE SUPP: 650.0000 mg | Freq: Four times a day (QID) | RECTAL | Status: DC | PRN

## 2011-08-07 MED ORDER — SODIUM CHLORIDE 0.9 % IV SOLN: 250.0000 mL | INTRAVENOUS | Status: DC

## 2011-08-07 MED ORDER — ONDANSETRON HCL 4 MG PO TABS: 4.0000 mg | ORAL_TABLET | Freq: Four times a day (QID) | ORAL | Status: DC | PRN

## 2011-08-07 MED ORDER — ONDANSETRON HCL 4 MG/2ML IJ SOLN: 4.0000 mg | Freq: Four times a day (QID) | INTRAMUSCULAR | Status: DC | PRN

## 2011-08-07 MED ORDER — SODIUM CHLORIDE 0.9 % IJ SOLN: 3.0000 mL | INTRAMUSCULAR | Status: DC | PRN

## 2011-08-07 MED ORDER — SODIUM CHLORIDE 0.9 % IJ SOLN
INTRAMUSCULAR | Status: AC
  Administered 2011-08-07: 11:00:00
  Filled 2011-08-07: qty 10

## 2011-08-07 MED ORDER — AMLODIPINE BESYLATE 5 MG PO TABS
5.0000 mg | ORAL_TABLET | Freq: Every day | ORAL | Status: DC
Start: 2011-08-07 — End: 2011-08-09
  Administered 2011-08-09: 5 mg via ORAL
  Filled 2011-08-07: qty 1

## 2011-08-07 MED ORDER — SIMVASTATIN 20 MG PO TABS
20.0000 mg | ORAL_TABLET | Freq: Every day | ORAL | Status: DC
  Administered 2011-08-08 – 2011-08-09 (×2): 20 mg via ORAL
  Filled 2011-08-07 (×2): qty 1

## 2011-08-07 MED ORDER — HYDROCODONE-ACETAMINOPHEN 5-325 MG PO TABS
1.0000 | ORAL_TABLET | ORAL | Status: DC | PRN
  Administered 2011-08-07 – 2011-08-09 (×6): 1 via ORAL
  Filled 2011-08-07 (×6): qty 1

## 2011-08-07 MED ORDER — SODIUM CHLORIDE 0.9 % IV SOLN
250.0000 mL | INTRAVENOUS | Status: DC
  Administered 2011-08-07 – 2011-08-08 (×3): 1000 mL via INTRAVENOUS

## 2011-08-07 NOTE — Progress Notes (Signed)
I reported patient's orthostatic blood pressures lying 74/43, sitting 76/43, and standing 90/46 to doctor Orvan Falconer.  No orders received.  Will continue to monitor patient.  Patient asymptomatic at this time.

## 2011-08-07 NOTE — H&P (Signed)
Hospital Admission Note Date: 08/07/2011  Patient name: [name] Medical record number: [id number] Date of birth: [birth year] Age: 77 y.o. Gender: female PCP: Monica Becton, MD  Attending physician: Christiane Ha  Chief Complaint: Anemia of And weakness  History of Present Illness: The patient is a 77 year old white female on Coumadin who was directly admitted from Dr. Armandina Stammer office with anemia. She has a large left leg hematoma that has developed over the past week. She had an INR of 6.9 last week. She held her Coumadin for several days and then resumed it. It is is much larger now, and she feels she is weak, nauseated, and faint. She had an INR checked today which was 2.7. Lobe and in the office was 7.5. Her blood pressure was normal. She is referred as a direct admit for transfusion. 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 She has never had a blood transfusion that she can recall. She feels dizzy with standing. She has had no emesis. She's had no evidence of gastrointestinal bleeding. She takes antihypertensives. She had no fall or injury that preceded the [place] hematoma. It is hard and painful. She has no other complaints.  Meds: Prescriptions prior to admission  Medication Sig Dispense Refill  . amLODipine (NORVASC) 5 MG tablet Take 5 mg by mouth daily.        [name] aspirin (ASPIRIN CHILDRENS) 81 MG chewable tablet Chew 81 mg by mouth daily.        [name] atorvastatin (LIPITOR) 40 MG tablet Take 40 mg by mouth daily.        . fish oil-omega-3 fatty acids 1000 MG capsule Take 1 g by mouth daily.        [name] HYDROcodone-acetaminophen (VICODIN) 5-500 MG per tablet Take 1 tablet by mouth daily as needed. For pain       . metoprolol (LOPRESSOR) 50 MG tablet Take 50 mg by mouth 2 (two) times daily.        [name] triamterene-hydrochlorothiazide (MAXZIDE-25) 37.5-25 MG per tablet Take 1 tablet by mouth daily.        [name] warfarin (COUMADIN) 4 MG  tablet Take 4 mg by mouth as directed.        [name] DISCONTD: benazepril (LOTENSIN) 20 MG tablet Take 20 mg by mouth daily.        [name] DISCONTD: acetaminophen (TYLENOL) 500 MG tablet Take 500 mg by mouth as needed. For pain      . DISCONTD: Calcium Carbonate (CALTRATE 600 PO) Take by mouth. Take 3 tablets by mouth daily       . DISCONTD: ezetimibe (ZETIA) 10 MG tablet Take 10 mg by mouth daily.        [name] DISCONTD: Omega-3 Fatty Acids (FISH OIL PO) Take by mouth 2 (two) times daily.         Allergies: Aspirin buffered; Fosamax; Ibuprofen; Prednisone; and Vioxx Past Medical History  Diagnosis Date  . Migraines   . Lower back pain     disc   . Osteoporosis   . Hyperlipidemia   . Hypertension   . Urethral stenosis   . Aortic aneurysm   . Cardiac aneurysm     ventrral - bypass   . History of aorto-femoral bypass   . Pancreatitis   . Stricture esophagus     Hx  . GERD (gastroesophageal reflux disease)   . Carotid bruit     bil carotid bruits   .  Peptic ulcer     Hx    Past Surgical History  Procedure Date  . Esophageal dilation 1996  . Lumbar disc surgery 1993  . Partial hysterectomy 1966    single oopherectomy (right)   . Aortic & cardiac aneurysm rupture 04/1998  . Aorta - bilateral femoral artery bypass graft 7/99    Dr. Madilyn Fireman    No family history on file. Review of Systems: Systems reviewed and otherwise negative. See history of present illness.  Physical Exam: vital signs are pending.  General Appearance:    Alert, cooperative, no distress, appears stated age  Head:    Normocephalic, without obvious abnormality, atraumatic  Eyes:    PERRL, conjunctiva/corneas clear, EOM's intact, fundi    benign, both eyes. Pale conjunctiva.      Nose:   Nares normal, septum midline, mucosa normal, no drainage    or sinus tenderness  Throat:   Lips, mucosa, and tongue normal; teeth and gums normal  Neck:   Supple, symmetrical, trachea midline, no adenopathy;    thyroid:  no  enlargement/tenderness/nodules; no carotid   bruit or JVD  Back:     Symmetric, no curvature, ROM normal, no CVA tenderness  Lungs:     Clear to auscultation bilaterally, respirations unlabored  Chest Wall:    No tenderness or deformity   Heart:    Regular rate and rhythm, S1 and S2 normal, no murmur, rub   or gallop  Breast Exam:   deferred  Abdomen:     Soft, non-tender, bowel sounds active all four quadrants,    no masses, no organomegaly  Genitalia:   deferred  Rectal:  deferred  Extremities:   Extremities normal, atraumatic, no cyanosis or edema  Pulses:   2+ and symmetric all extremities  Skin:   leg has a large ecchymotic hematoma in from the hip to the heel posteriorly. It is tender and indurated but no evidence of infection.   Lymph nodes:   Cervical, supraclavicular, and axillary nodes normal  Neurologic:   CNII-XII intact, normal strength, sensation and reflexes    throughout     Assessment & Plan by Problem: Principal Problem:  *Anemia associated with acute blood loss repeat hemoglobin. Will transfuse a unit of blood. Patient is very symptomatic and will need to remain on Coumadin. She will be observation status. Active Problems:  Dizzy. Secondary to anemia: I will check orthostatic vital signs and hold her antihypertensives for now.  Nausea alone. Likely secondary to above.  Chronic anticoagulation was reportedly therapeutic today. I will repeat an INR.  Benign hypertension the above  H/O: CVA (cardiovascular accident)  Cerebrovascular disease  CAD (coronary artery disease)  PVD (peripheral vascular disease)  Hyperlipidemia  Status post dilatation of esophageal stricture  Ventricular aneurysm  GERD with stricture  COPD (chronic obstructive pulmonary disease) stable    SULLIVAN,CORINNA L 08/07/2011, 12:23 PM

## 2011-08-07 NOTE — Progress Notes (Signed)
Repeat hemoglobin is 8.7. she is slightly orthostatic. I will give IV fluid and repeat the hemoglobin in the morning. Hold off on transfusion for now. Her blood pressure is borderline low and she may have been hypotensive without significant anemia as the cause of her symptoms.

## 2011-08-08 LAB — HEMOGLOBIN: Hemoglobin: 7.9 g/dL — ABNORMAL LOW (ref 12.0–15.0)

## 2011-08-08 MED ORDER — SODIUM CHLORIDE 0.9 % IJ SOLN
INTRAMUSCULAR | Status: AC
  Administered 2011-08-08: 3 mL
  Filled 2011-08-08: qty 3

## 2011-08-08 MED ORDER — SODIUM CHLORIDE 0.9 % IV SOLN
250.0000 mL | INTRAVENOUS | Status: DC
  Administered 2011-08-08: 500 mL via INTRAVENOUS
  Administered 2011-08-09: 250 mL via INTRAVENOUS

## 2011-08-08 MED ORDER — WARFARIN SODIUM 2 MG PO TABS
4.0000 mg | ORAL_TABLET | Freq: Once | ORAL | Status: AC
  Administered 2011-08-08: 4 mg via ORAL
  Filled 2011-08-08: qty 2

## 2011-08-08 NOTE — Consult Note (Signed)
ANTICOAGULATION CONSULT NOTE - Initial Consult  Pharmacy Consult for Warfarin Indication: ventricular aneurysm  Patient Measurements: Height: 5\' 5"  (165.1 cm) Weight: 150 lb (68.04 kg) IBW/kg (Calculated) : 57   Vital Signs: Temp: 98.4 F (36.9 C) (08/29 0515) Temp src: Oral (08/29 0515) BP: 97/55 mmHg (08/29 0517) Pulse Rate: 91  (08/29 0517)  Labs:  Basename 08/08/11 0434 08/07/11 1241  HGB 7.9* 8.7*  HCT -- 26.1*  PLT -- 463*  APTT -- 51*  LABPROT 26.9* 26.8*  INR 2.44* 2.43*  HEPARINUNFRC -- --  CREATININE -- 1.07  CRCLEARANCE -- --  CKTOTAL -- --  CKMB -- --  TROPONINI -- --    Medical History: Past Medical History  Diagnosis Date  . Migraines   . Lower back pain     disc   . Osteoporosis   . Hyperlipidemia   . Hypertension   . Urethral stenosis   . Aortic aneurysm   . Cardiac aneurysm     ventrral - bypass   . History of aorto-femoral bypass   . Pancreatitis   . Stricture esophagus     Hx  . GERD (gastroesophageal reflux disease)   . Carotid bruit     bil carotid bruits   . Peptic ulcer     Hx     Medications:  Scheduled:    . amLODipine  5 mg Oral Daily  . aspirin  81 mg Oral Daily  . omega-3 acid ethyl esters  1 g Oral Daily  . potassium chloride  10 mEq Intravenous Q1 Hr x 2  . simvastatin  20 mg Oral Daily  . sodium chloride  3 mL Intravenous Q12H  . sodium chloride      . warfarin  4 mg Oral ONCE-1800    Assessment: INR therapeutic Home dose reportedly 4mg  daily  Goal of Therapy:  INR 2-3   Plan:  Coumadin 4mg  today INR daily until stable  Hall, Scott A 08/08/2011,12:56 PM

## 2011-08-08 NOTE — Progress Notes (Signed)
Subjective: Still feels quite dizzy and lightheaded with ambulation. Also gets short of breath with ambulation.  Objective: Vital signs in last 24 hours: Temp:  [98.2 F (36.8 C)-98.4 F (36.9 C)] 98.4 F (36.9 C) (08/29 0515) Pulse Rate:  [78-99] 91  (08/29 0517) Resp:  [18-20] 18  (08/29 0517) BP: (74-118)/(43-66) 97/55 mmHg (08/29 0517) SpO2:  [91 %-96 %] 95 % (08/29 0517) Weight change:  Last BM Date: 08/07/11  Intake/Output from previous day: 08/28 0701 - 08/29 0700 In: 750 [P.O.:240; I.V.:310; IV Piggyback:200] Out: 750 [Urine:750] I/O this shift: In: 440 [P.O.:440] Out: -    Physical Exam: General: Alert, awake, oriented x3, in no acute distress. HEENT: No bruits, no goiter. Heart: Regular rate and rhythm, without murmurs, rubs, gallops. Lungs: Clear to auscultation bilaterally. Abdomen: Soft, nontender, nondistended, positive bowel sounds. Extremities: No clubbing cyanosis or edema with positive pedal pulses. Large bruise on left thigh extending below the knee. Neuro: Grossly intact, nonfocal.    Lab Results:  Basename 08/08/11 0434 08/07/11 1241  WBC -- 8.3  HGB 7.9* 8.7*  HCT -- 26.1*  PLT -- 463*    Basename 08/07/11 1241  NA 137  K 3.5  CL 102  CO2 27  GLUCOSE 153*  BUN 20  CREATININE 1.07  CALCIUM 9.4     Studies/Results: No results found.  Medications: Scheduled Meds:   . amLODipine  5 mg Oral Daily  . aspirin  81 mg Oral Daily  . omega-3 acid ethyl esters  1 g Oral Daily  . potassium chloride  10 mEq Intravenous Q1 Hr x 2  . simvastatin  20 mg Oral Daily  . sodium chloride  3 mL Intravenous Q12H  . sodium chloride       Continuous Infusions:   . sodium chloride    . DISCONTD: sodium chloride    . DISCONTD: sodium chloride 1,000 mL (08/08/11 0832)   PRN Meds:.acetaminophen, acetaminophen, alum & mag hydroxide-simeth, HYDROcodone-acetaminophen, ondansetron (ZOFRAN) IV, ondansetron, senna-docusate, sodium chloride,  traZODone  Assessment/Plan:  1.  Anemia associated with acute blood loss: Few days back she developed a large bruise over her left thigh secondary to a supratherapeutic INR. She has subsequently become quite dizzy lightheaded and short of breath with ambulation. Her hemoglobin today is 7.9 down from 8.7 yesterday. The change in hemoglobin  can likely be explained by dilution given she has saline running at 150 cc an hour. Nonetheless I believe it is prudent to transfuse her with 2 units of PRBCs given her symptomatic anemia. 2.  H/O: CVA (cardiovascular accident) 3.  CAD (coronary artery disease) 4.  PVD (peripheral vascular disease) 5.  Benign hypertension 6.  Hyperlipidemia 7.  Status post dilatation of esophageal stricture 8.  Ventricular aneurysm 9.  GERD with stricture 10.  COPD (chronic obstructive pulmonary disease) 11. Disposition: Plan to DC home in the morning after transfusion is complete.    LOS: 1 day   HERNANDEZ ACOSTA,ESTELA 08/08/2011, 12:25 PM

## 2011-08-08 NOTE — Progress Notes (Signed)
UR Chart Review Completed  

## 2011-08-09 LAB — CBC
HCT: 31.9 % — ABNORMAL LOW (ref 36.0–46.0)
Hemoglobin: 10.8 g/dL — ABNORMAL LOW (ref 12.0–15.0)
MCH: 30.6 pg (ref 26.0–34.0)
MCHC: 33.9 g/dL (ref 30.0–36.0)
RBC: 3.53 MIL/uL — ABNORMAL LOW (ref 3.87–5.11)

## 2011-08-09 LAB — PROTIME-INR
INR: 1.89 — ABNORMAL HIGH (ref 0.00–1.49)
Prothrombin Time: 22 seconds — ABNORMAL HIGH (ref 11.6–15.2)

## 2011-08-09 LAB — BASIC METABOLIC PANEL
BUN: 14 mg/dL (ref 6–23)
Chloride: 108 mEq/L (ref 96–112)
GFR calc non Af Amer: >60 mL/min (ref >60)
Glucose, Bld: 92 mg/dL (ref 70–99)
Potassium: 3.6 mEq/L (ref 3.5–5.1)
Sodium: 141 mEq/L (ref 135–145)

## 2011-08-09 LAB — TYPE AND SCREEN
ABO/RH(D): O POS
Unit division: 0

## 2011-08-09 MED ORDER — FISH OIL 1000 MG PO CAPS: 2.0000 | ORAL_CAPSULE | Freq: Every day | ORAL | Status: DC

## 2011-08-09 MED ORDER — WARFARIN SODIUM 7.5 MG PO TABS: 7.5000 mg | ORAL_TABLET | Freq: Once | ORAL | Status: DC

## 2011-08-09 MED ORDER — WARFARIN SODIUM 7.5 MG PO TABS
7.5000 mg | ORAL_TABLET | Freq: Once | ORAL | Status: AC
  Administered 2011-08-09: 7.5 mg via ORAL
  Filled 2011-08-09: qty 1

## 2011-08-09 MED ORDER — CALCIUM CARBONATE 1500 (600 CA) MG PO TABS: 1.0000 | ORAL_TABLET | Freq: Every day | ORAL | Status: DC

## 2011-08-09 MED ORDER — BENAZEPRIL HCL 20 MG PO TABS: 20.0000 mg | ORAL_TABLET | Freq: Every day | ORAL | Status: DC

## 2011-08-09 MED ORDER — EZETIMIBE 10 MG PO TABS: 10.0000 mg | ORAL_TABLET | Freq: Every day | ORAL | Status: DC

## 2011-08-09 NOTE — Progress Notes (Signed)
D/c instructions reviewed with patient. Verbalized understanding. Pt dc'd to home with husband.  Schonewitz, Candelaria Stagers 08/09/2011 1136

## 2011-08-09 NOTE — Progress Notes (Signed)
ANTICOAGULATION CONSULT NOTE - Initial Consult  Pharmacy Consult for Warfarin Indication: ventricular aneurysm  Patient Measurements: Height: 5\' 5"  (165.1 cm) Weight: 150 lb (68.04 kg) IBW/kg (Calculated) : 57   Vital Signs: Temp: 98 F (36.7 C) (08/30 0635) Temp src: Oral (08/30 0635) BP: 108/62 mmHg (08/30 0637) Pulse Rate: 81  (08/30 0637)  Labs:  Basename 08/09/11 0425 08/08/11 0434 08/07/11 1241  HGB 10.8* 7.9* --  HCT 31.9* -- 26.1*  PLT 418* -- 463*  APTT -- -- 51*  LABPROT 22.0* 26.9* 26.8*  INR 1.89* 2.44* 2.43*  HEPARINUNFRC -- -- --  CREATININE 0.85 -- 1.07  CRCLEARANCE -- -- --  CKTOTAL -- -- --  CKMB -- -- --  TROPONINI -- -- --    Medical History: Past Medical History  Diagnosis Date  . Migraines   . Lower back pain     disc   . Osteoporosis   . Hyperlipidemia   . Hypertension   . Urethral stenosis   . Aortic aneurysm   . Cardiac aneurysm     ventrral - bypass   . History of aorto-femoral bypass   . Pancreatitis   . Stricture esophagus     Hx  . GERD (gastroesophageal reflux disease)   . Carotid bruit     bil carotid bruits   . Peptic ulcer     Hx     Medications:  Scheduled:     . amLODipine  5 mg Oral Daily  . aspirin  81 mg Oral Daily  . omega-3 acid ethyl esters  1 g Oral Daily  . simvastatin  20 mg Oral Daily  . sodium chloride  3 mL Intravenous Q12H  . warfarin  4 mg Oral ONCE-1800    Assessment: INR subtherapeutic Home dose reportedly 4mg  daily  Goal of Therapy:  INR 2-3   Plan:  Coumadin 7.5mg  today INR daily until stable  Valls, [place] J 08/09/2011,9:36 AM

## 2011-08-09 NOTE — Discharge Summary (Signed)
Physician Discharge Summary  Patient ID: [name] MRN: [id number] DOB/AGE: 1932/06/09 77 y.o.  Admit date: 08/07/2011 Discharge date: 08/09/2011  Primary Care Physician:  Monica Becton, MD   Discharge Diagnoses:    Patient Active Problem List  Diagnoses  . Anemia associated with acute blood loss  . Dizzy  . Nausea alone  . Chronic anticoagulation  . H/O: CVA (cardiovascular accident)  . Cerebrovascular disease  . CAD (coronary artery disease)  . PVD (peripheral vascular disease)  . Benign hypertension  . Hyperlipidemia  . Status post dilatation of esophageal stricture  . Ventricular aneurysm  . GERD with stricture  . COPD (chronic obstructive pulmonary disease)    Current Discharge Medication List    CONTINUE these medications which have CHANGED   Details  benazepril (LOTENSIN) 20 MG tablet Take 1 tablet (20 mg total) by mouth daily. Qty: 30 tablet, Refills: 0    Calcium Carbonate (CALTRATE 600) 1500 MG TABS Take 1 tablet (1,500 mg total) by mouth daily. Qty: 30 tablet, Refills: 11    ezetimibe (ZETIA) 10 MG tablet Take 1 tablet (10 mg total) by mouth daily. Qty: 30 tablet, Refills: 0      CONTINUE these medications which have NOT CHANGED   Details  amLODipine (NORVASC) 5 MG tablet Take 5 mg by mouth daily.      aspirin (ASPIRIN CHILDRENS) 81 MG chewable tablet Chew 81 mg by mouth daily.      atorvastatin (LIPITOR) 40 MG tablet Take 40 mg by mouth daily.      fish oil-omega-3 fatty acids 1000 MG capsule Take 1 g by mouth daily.      HYDROcodone-acetaminophen (VICODIN) 5-500 MG per tablet Take 1 tablet by mouth daily as needed. For pain     metoprolol (LOPRESSOR) 50 MG tablet Take 50 mg by mouth 2 (two) times daily.      triamterene-hydrochlorothiazide (MAXZIDE-25) 37.5-25 MG per tablet Take 1 tablet by mouth daily.      warfarin (COUMADIN) 4 MG tablet Take 4 mg by mouth as directed.        STOP taking these medications     acetaminophen  (TYLENOL) 500 MG tablet          Disposition and Follow-up: Patient will be discharged home today in stable and improved condition. We have restarted her Coumadin and her INR is still subtherapeutic, she will followup with her primary care provider tomorrow for further Coumadin dosing adjustments.  Consults:  none    Significant Diagnostic Studies:  No results found.  Brief H and P: For complete details please refer to admission H and P, but in brief [name] is a very pleasant 77 year old white woman who has a history of a ventricular aneurysm for which she has been maintained on chronic anticoagulation for Coumadin. For the past week her INR was noted to be supratherapeutic and she developed a large bruise/hematoma over her left thigh. She went to see her PCP on day of admission where hemoglobin was noted to be 7.8 and she was dizzy, lightheaded and short of breath with ambulation, and was referred to [place] for admission for transfusion.    Hospital Course:   1.  Anemia associated with acute blood loss: She developed a large left thigh hematoma secondary to a supratherapeutic INR, she then developed symptomatic anemia with dizziness and shortness of breath with ambulation. She has been transfused a total of 2 units of PRBCs. Her hemoglobin on day of discharge is 10.8. She is  feeling much better and has in fact walked around the hallways and around the nurse's station. I have restarted her Coumadin for her ventricular aneurysm. Her INR today is 1.89, I have instructed her to return to her primary care provider's office tomorrow for an INR check and continued Coumadin dose adjustment. 2.  Dizzy 3.  Nausea alone 4.  Chronic anticoagulation 5.  H/O: CVA (cardiovascular accident) 6.  Cerebrovascular disease 7.  CAD (coronary artery disease) 8.  PVD (peripheral vascular disease) 9.  Benign hypertension 10.  Hyperlipidemia 11.  Status post dilatation of esophageal stricture 12.  Ventricular  aneurysm 13.  GERD with stricture 14.  COPD (chronic obstructive pulmonary disease)  Time spent on Discharge: Greater than 30 minutes.  SignedChaya Jan 08/09/2011, 10:11 AM

## 2011-09-17 LAB — COMPREHENSIVE METABOLIC PANEL
ALT: 26
AST: 24
Alkaline Phosphatase: 67
CO2: 25
Chloride: 104
GFR calc Af Amer: 51 — ABNORMAL LOW
GFR calc non Af Amer: 42 — ABNORMAL LOW
Glucose, Bld: 108 — ABNORMAL HIGH
Potassium: 4.1
Sodium: 138
Total Bilirubin: 1.1

## 2011-09-17 LAB — BLOOD GAS, ARTERIAL
Acid-Base Excess: 2
Drawn by: 274481
pCO2 arterial: 38.5
pH, Arterial: 7.442 — ABNORMAL HIGH
pO2, Arterial: 86.5

## 2011-09-17 LAB — URINALYSIS, ROUTINE W REFLEX MICROSCOPIC
Glucose, UA: NEGATIVE
Protein, ur: NEGATIVE
Specific Gravity, Urine: 1.011
pH: 8

## 2011-09-17 LAB — BASIC METABOLIC PANEL
BUN: 17
GFR calc Af Amer: 49 — ABNORMAL LOW
GFR calc non Af Amer: 41 — ABNORMAL LOW
Potassium: 4.5

## 2011-09-17 LAB — CBC
HCT: 33 — ABNORMAL LOW
Hemoglobin: 13
Platelets: 266
RBC: 3.56 — ABNORMAL LOW
RBC: 4.06
WBC: 17.3 — ABNORMAL HIGH
WBC: 8.4

## 2011-09-17 LAB — PROTIME-INR
Prothrombin Time: 12.5
Prothrombin Time: 21.5 — ABNORMAL HIGH

## 2011-09-17 LAB — ABO/RH: ABO/RH(D): O POS

## 2011-09-17 LAB — TYPE AND SCREEN: ABO/RH(D): O POS

## 2011-12-25 DIAGNOSIS — I4891 Unspecified atrial fibrillation: Secondary | ICD-10-CM | POA: Diagnosis not present

## 2012-02-05 DIAGNOSIS — I714 Abdominal aortic aneurysm, without rupture: Secondary | ICD-10-CM | POA: Diagnosis not present

## 2012-02-12 DIAGNOSIS — I119 Hypertensive heart disease without heart failure: Secondary | ICD-10-CM | POA: Diagnosis not present

## 2012-02-12 DIAGNOSIS — I059 Rheumatic mitral valve disease, unspecified: Secondary | ICD-10-CM | POA: Diagnosis not present

## 2012-02-12 DIAGNOSIS — E785 Hyperlipidemia, unspecified: Secondary | ICD-10-CM | POA: Diagnosis not present

## 2012-02-12 DIAGNOSIS — I252 Old myocardial infarction: Secondary | ICD-10-CM | POA: Diagnosis not present

## 2012-02-12 DIAGNOSIS — I2510 Atherosclerotic heart disease of native coronary artery without angina pectoris: Secondary | ICD-10-CM | POA: Diagnosis not present

## 2012-02-12 DIAGNOSIS — I739 Peripheral vascular disease, unspecified: Secondary | ICD-10-CM | POA: Diagnosis not present

## 2012-02-12 DIAGNOSIS — N289 Disorder of kidney and ureter, unspecified: Secondary | ICD-10-CM | POA: Diagnosis not present

## 2012-03-18 DIAGNOSIS — I714 Abdominal aortic aneurysm, without rupture: Secondary | ICD-10-CM | POA: Diagnosis not present

## 2012-04-25 DIAGNOSIS — I714 Abdominal aortic aneurysm, without rupture: Secondary | ICD-10-CM | POA: Diagnosis not present

## 2012-04-25 DIAGNOSIS — E559 Vitamin D deficiency, unspecified: Secondary | ICD-10-CM | POA: Diagnosis not present

## 2012-04-25 DIAGNOSIS — E785 Hyperlipidemia, unspecified: Secondary | ICD-10-CM | POA: Diagnosis not present

## 2012-04-25 DIAGNOSIS — I10 Essential (primary) hypertension: Secondary | ICD-10-CM | POA: Diagnosis not present

## 2012-05-13 ENCOUNTER — Encounter: Payer: Self-pay | Admitting: Neurosurgery

## 2012-05-14 ENCOUNTER — Encounter: Payer: Self-pay | Admitting: Neurosurgery

## 2012-05-14 ENCOUNTER — Ambulatory Visit (INDEPENDENT_AMBULATORY_CARE_PROVIDER_SITE_OTHER): Payer: Medicare Other | Admitting: Neurosurgery

## 2012-05-14 ENCOUNTER — Ambulatory Visit: Payer: Medicare Other | Admitting: Vascular Surgery

## 2012-05-14 ENCOUNTER — Ambulatory Visit (INDEPENDENT_AMBULATORY_CARE_PROVIDER_SITE_OTHER): Payer: Medicare Other | Admitting: *Deleted

## 2012-05-14 VITALS — BP 101/59 | HR 67 | Resp 12 | Ht 65.0 in | Wt 154.9 lb

## 2012-05-14 DIAGNOSIS — Z48812 Encounter for surgical aftercare following surgery on the circulatory system: Secondary | ICD-10-CM

## 2012-05-14 DIAGNOSIS — I6529 Occlusion and stenosis of unspecified carotid artery: Secondary | ICD-10-CM | POA: Diagnosis not present

## 2012-05-14 NOTE — Progress Notes (Signed)
[facility] HISTORY AND PHYSICAL   CC: Carotid duplex and evaluation of left CCA the subclavian bypass Referring Physician: Edilia Bo  History of Present Illness: 78 year old female patient of Dr. Edilia Bo who used to be treated here by Dr. Madilyn Fireman. Patient underwent a left CEA in left CCA to subclavian bypass in July 2003. She's done well since that time and had a redo left CEA with Dr. Madilyn Fireman in 2008. Patient reports no signs or symptoms of CVA, TIA, amaurosis fugax or any neural deficit. Patient denies any new medical diagnoses or any recent surgeries.  Past Medical History  Diagnosis Date  . Migraines   . Lower back pain     disc   . Osteoporosis   . Hyperlipidemia   . Hypertension   . Urethral stenosis   . Aortic aneurysm   . Cardiac aneurysm     ventrral - bypass   . History of aorto-femoral bypass   . Pancreatitis   . Stricture esophagus     Hx  . GERD (gastroesophageal reflux disease)   . Carotid bruit     bil carotid bruits   . Peptic ulcer     Hx     ROS: [x]  Positive   [ ]  Denies    General: [ ]  Weight loss, [ ]  Fever, [ ]  chills Neurologic: [ ]  Dizziness, [ ]  Blackouts, [ ]  Seizure [ ]  Stroke, [ ]  "Mini stroke", [ ]  Slurred speech, [ ]  Temporary blindness; [ ]  weakness in arms or legs, [ ]  Hoarseness Cardiac: [ ]  Chest pain/pressure, [ ]  Shortness of breath at rest [ ]  Shortness of breath with exertion, [ ]  Atrial fibrillation or irregular heartbeat Vascular: [ ]  Pain in legs with walking, [ ]  Pain in legs at rest, [ ]  Pain in legs at night,  [ ]  Non-healing ulcer, [ ]  Blood clot in vein/DVT,   Pulmonary: [ ]  Home oxygen, [ ]  Productive cough, [ ]  Coughing up blood, [ ]  Asthma,  [ ]  Wheezing Musculoskeletal:  [ ]  Arthritis, [ ]  Low back pain, [ ]  Joint pain Hematologic: [ ]  Easy Bruising, [ ]  Anemia; [ ]  Hepatitis Gastrointestinal: [ ]  Blood in stool, [ ]  Gastroesophageal Reflux/heartburn, [ ]  Trouble swallowing Urinary: [ ]  chronic  Kidney disease, [ ]  on HD - [ ]  MWF or [ ]  TTHS, [ ]  Burning with urination, [ ]  Difficulty urinating Skin: [ ]  Rashes, [ ]  Wounds Psychological: [ ]  Anxiety, [ ]  Depression   Social History History  Substance Use Topics  . Smoking status: Former Smoker    Quit date: 04/06/1998  . Smokeless tobacco: Not on file  . Alcohol Use: No    Family History History reviewed. No pertinent family history.  Allergies  Allergen Reactions  . Alendronate Sodium Other (See Comments)    Irritates esophagus and ulcers  . Buffered Aspirin Other (See Comments)    On blood thinners but doctor has started aspirin as stroke therapy  . Ibuprofen Other (See Comments)    Blood thinner therapy  . Prednisone Other (See Comments)    Patient states due to osteoporosis  . Vioxx (Rofecoxib) Other (See Comments)    Unknown by patient    Current Outpatient Prescriptions  Medication Sig Dispense Refill  . amLODipine (NORVASC) 5 MG tablet Take 5 mg by mouth daily.        [name] atorvastatin (LIPITOR) 40 MG tablet Take 40 mg by mouth daily.        [name]  benazepril (LOTENSIN) 20 MG tablet Take 1 tablet (20 mg total) by mouth daily.  30 tablet  0  . Calcium Carbonate (CALTRATE 600) 1500 MG TABS Take 1 tablet (1,500 mg total) by mouth daily.  30 tablet  11  . ezetimibe (ZETIA) 10 MG tablet Take 1 tablet (10 mg total) by mouth daily.  30 tablet  0  . fish oil-omega-3 fatty acids 1000 MG capsule Take 1 g by mouth daily.        . metoprolol (LOPRESSOR) 50 MG tablet Take 50 mg by mouth 2 (two) times daily.        [name] triamterene-hydrochlorothiazide (MAXZIDE-25) 37.5-25 MG per tablet Take 1 tablet by mouth daily.        [name] warfarin (COUMADIN) 4 MG tablet Take 4 mg by mouth as directed.        [name] aspirin (ASPIRIN CHILDRENS) 81 MG chewable tablet Chew 81 mg by mouth daily.        [name] HYDROcodone-acetaminophen (VICODIN) 5-500 MG per tablet Take 1 tablet by mouth daily as needed. For pain         Physical Examination  Filed Vitals:     05/14/12 1036  BP: 101/59  Pulse: 67  Resp:     Body mass index is 25.78 kg/(m^2).  General:  WDWN in NAD Gait: Normal HEENT: WNL Eyes: Pupils equal Pulmonary: normal non-labored breathing , without Rales, rhonchi,  wheezing Cardiac: RRR, without  Murmurs, rubs or gallops; Abdomen: soft, NT, no masses Skin: no rashes, ulcers noted  Vascular Exam Pulses: 2+ radial pulses bilaterally Carotid bruits are heard bilaterally Extremities without ischemic changes, no Gangrene , no cellulitis; no open wounds;  Musculoskeletal: no muscle wasting or atrophy   Neurologic: A&O X 3; Appropriate Affect ; SENSATION: normal; MOTOR FUNCTION:  moving all extremities equally. Speech is fluent/normal  Non-Invasive Vascular Imaging CAROTID DUPLEX 05/14/2012  Right ICA 20 - 39 % stenosis Left ICA 0 - 19% stenosis Elevated velocities to 39 cm/S., and the left CCA to subclavian bypass, this was discussed with Dr. Edilia Bo and he feels and this will be fine to recheck in 6 months  ASSESSMENT/PLAN: Asymptomatic carotid stenosis with a history of a left CCA to subclavian bypass 10 years ago. The patient will followup here in 6 months for repeat carotid duplex, her questions were encouraged and answered, she is in agreement with this plan. Sharlee Blew ANP   Clinic MD: Edilia Bo

## 2012-05-14 NOTE — Progress Notes (Signed)
Addended by: Sharee Pimple on: 05/14/2012 11:42 AM   Modules accepted: Orders

## 2012-05-21 NOTE — Procedures (Unsigned)
CAROTID DUPLEX EXAM  INDICATION:  Carotid stenosis.  HISTORY: Diabetes:  No. Cardiac:  MI. Hypertension:  Yes. Smoking:  Previous. Previous Surgery:  Left CEA and left CCA to subclavian bypass, July 2003.  Re-do of left CEA, 11/18/2007 by Dr. Madilyn Fireman. CV History: Amaurosis Fugax No, Paresthesias No, Hemiparesis No.                                      RIGHT             LEFT Brachial systolic pressure:         132               138 Brachial Doppler waveforms:         Normal            Normal Vertebral direction of flow:        Antegrade         Antegrade DUPLEX VELOCITIES (cm/sec) CCA peak systolic                   47                184 ECA peak systolic                   39                78 ICA peak systolic                   69                89 ICA end diastolic                   24                31 PLAQUE MORPHOLOGY:                  Calcific          Calcific PLAQUE AMOUNT:                      Minimal           Minimal PLAQUE LOCATION:                    CCA, ICA, ECA     CCA  IMPRESSION: 1. Right common carotid artery disease present. 2. Known proximal occlusion of the right external carotid artery,     which is fed distally by collateral circulation. 3. Right internal carotid artery velocities suggest 1% to 39%     stenosis. 4. Elevated velocities present in the left common carotid artery to     subclavian artery bypass with a velocity of 239 cm/s noted.  Left     common carotid artery disease. 5. Patent left carotid endarterectomy site with no evidence of     restenosis of the internal carotid artery. 6. Antegrade vertebral arteries bilaterally.  ___________________________________________ Di Kindle. Edilia Bo, M.D.  EM/MEDQ  D:  05/14/2012  T:  05/14/2012  Job:  [id number]

## 2012-06-10 DIAGNOSIS — I4891 Unspecified atrial fibrillation: Secondary | ICD-10-CM | POA: Diagnosis not present

## 2012-07-01 DIAGNOSIS — Z1231 Encounter for screening mammogram for malignant neoplasm of breast: Secondary | ICD-10-CM | POA: Diagnosis not present

## 2012-07-02 ENCOUNTER — Other Ambulatory Visit: Payer: Self-pay | Admitting: Cardiology

## 2012-07-09 DIAGNOSIS — M810 Age-related osteoporosis without current pathological fracture: Secondary | ICD-10-CM | POA: Diagnosis not present

## 2012-07-22 DIAGNOSIS — E559 Vitamin D deficiency, unspecified: Secondary | ICD-10-CM | POA: Diagnosis not present

## 2012-07-22 DIAGNOSIS — I4891 Unspecified atrial fibrillation: Secondary | ICD-10-CM | POA: Diagnosis not present

## 2012-07-22 DIAGNOSIS — M810 Age-related osteoporosis without current pathological fracture: Secondary | ICD-10-CM | POA: Diagnosis not present

## 2012-08-19 DIAGNOSIS — I2510 Atherosclerotic heart disease of native coronary artery without angina pectoris: Secondary | ICD-10-CM | POA: Diagnosis not present

## 2012-08-19 DIAGNOSIS — N289 Disorder of kidney and ureter, unspecified: Secondary | ICD-10-CM | POA: Diagnosis not present

## 2012-08-19 DIAGNOSIS — I252 Old myocardial infarction: Secondary | ICD-10-CM | POA: Diagnosis not present

## 2012-08-19 DIAGNOSIS — I739 Peripheral vascular disease, unspecified: Secondary | ICD-10-CM | POA: Diagnosis not present

## 2012-08-19 DIAGNOSIS — I119 Hypertensive heart disease without heart failure: Secondary | ICD-10-CM | POA: Diagnosis not present

## 2012-08-19 DIAGNOSIS — E785 Hyperlipidemia, unspecified: Secondary | ICD-10-CM | POA: Diagnosis not present

## 2012-08-19 DIAGNOSIS — I059 Rheumatic mitral valve disease, unspecified: Secondary | ICD-10-CM | POA: Diagnosis not present

## 2012-08-19 DIAGNOSIS — I4891 Unspecified atrial fibrillation: Secondary | ICD-10-CM | POA: Diagnosis not present

## 2012-09-16 DIAGNOSIS — M2550 Pain in unspecified joint: Secondary | ICD-10-CM | POA: Diagnosis not present

## 2012-09-16 DIAGNOSIS — M25539 Pain in unspecified wrist: Secondary | ICD-10-CM | POA: Diagnosis not present

## 2012-09-16 DIAGNOSIS — M25519 Pain in unspecified shoulder: Secondary | ICD-10-CM | POA: Diagnosis not present

## 2012-09-30 DIAGNOSIS — Z79899 Other long term (current) drug therapy: Secondary | ICD-10-CM | POA: Diagnosis not present

## 2012-10-01 DIAGNOSIS — Z23 Encounter for immunization: Secondary | ICD-10-CM | POA: Diagnosis not present

## 2012-10-27 ENCOUNTER — Emergency Department (HOSPITAL_COMMUNITY): Payer: Medicare Other

## 2012-10-27 ENCOUNTER — Inpatient Hospital Stay (HOSPITAL_COMMUNITY)
Admission: EM | Admit: 2012-10-27 | Discharge: 2012-11-01 | DRG: 392 | Disposition: A | Discharge status: Home / Self Care | Payer: Medicare Other | Attending: Internal Medicine | Admitting: Internal Medicine

## 2012-10-27 ENCOUNTER — Encounter (HOSPITAL_COMMUNITY): Payer: Self-pay | Admitting: Emergency Medicine

## 2012-10-27 DIAGNOSIS — I679 Cerebrovascular disease, unspecified: Secondary | ICD-10-CM | POA: Diagnosis present

## 2012-10-27 DIAGNOSIS — E876 Hypokalemia: Secondary | ICD-10-CM | POA: Diagnosis not present

## 2012-10-27 DIAGNOSIS — E860 Dehydration: Secondary | ICD-10-CM | POA: Diagnosis not present

## 2012-10-27 DIAGNOSIS — R519 Headache, unspecified: Secondary | ICD-10-CM | POA: Diagnosis present

## 2012-10-27 DIAGNOSIS — Z9079 Acquired absence of other genital organ(s): Secondary | ICD-10-CM | POA: Diagnosis not present

## 2012-10-27 DIAGNOSIS — Z886 Allergy status to analgesic agent status: Secondary | ICD-10-CM

## 2012-10-27 DIAGNOSIS — R197 Diarrhea, unspecified: Secondary | ICD-10-CM

## 2012-10-27 DIAGNOSIS — Z86718 Personal history of other venous thrombosis and embolism: Secondary | ICD-10-CM

## 2012-10-27 DIAGNOSIS — I2510 Atherosclerotic heart disease of native coronary artery without angina pectoris: Secondary | ICD-10-CM | POA: Diagnosis present

## 2012-10-27 DIAGNOSIS — R6889 Other general symptoms and signs: Secondary | ICD-10-CM | POA: Diagnosis not present

## 2012-10-27 DIAGNOSIS — I739 Peripheral vascular disease, unspecified: Secondary | ICD-10-CM | POA: Diagnosis not present

## 2012-10-27 DIAGNOSIS — I728 Aneurysm of other specified arteries: Secondary | ICD-10-CM | POA: Diagnosis present

## 2012-10-27 DIAGNOSIS — I253 Aneurysm of heart: Secondary | ICD-10-CM | POA: Diagnosis present

## 2012-10-27 DIAGNOSIS — Z8711 Personal history of peptic ulcer disease: Secondary | ICD-10-CM | POA: Diagnosis not present

## 2012-10-27 DIAGNOSIS — E785 Hyperlipidemia, unspecified: Secondary | ICD-10-CM | POA: Diagnosis present

## 2012-10-27 DIAGNOSIS — E861 Hypovolemia: Secondary | ICD-10-CM | POA: Diagnosis present

## 2012-10-27 DIAGNOSIS — I951 Orthostatic hypotension: Secondary | ICD-10-CM | POA: Diagnosis not present

## 2012-10-27 DIAGNOSIS — K5730 Diverticulosis of large intestine without perforation or abscess without bleeding: Secondary | ICD-10-CM | POA: Diagnosis not present

## 2012-10-27 DIAGNOSIS — E669 Obesity, unspecified: Secondary | ICD-10-CM | POA: Diagnosis present

## 2012-10-27 DIAGNOSIS — Z8249 Family history of ischemic heart disease and other diseases of the circulatory system: Secondary | ICD-10-CM

## 2012-10-27 DIAGNOSIS — J449 Chronic obstructive pulmonary disease, unspecified: Secondary | ICD-10-CM

## 2012-10-27 DIAGNOSIS — Z7901 Long term (current) use of anticoagulants: Secondary | ICD-10-CM

## 2012-10-27 DIAGNOSIS — R110 Nausea: Secondary | ICD-10-CM

## 2012-10-27 DIAGNOSIS — M810 Age-related osteoporosis without current pathological fracture: Secondary | ICD-10-CM | POA: Diagnosis present

## 2012-10-27 DIAGNOSIS — Z888 Allergy status to other drugs, medicaments and biological substances status: Secondary | ICD-10-CM

## 2012-10-27 DIAGNOSIS — K529 Noninfective gastroenteritis and colitis, unspecified: Secondary | ICD-10-CM

## 2012-10-27 DIAGNOSIS — Z90710 Acquired absence of both cervix and uterus: Secondary | ICD-10-CM | POA: Diagnosis not present

## 2012-10-27 DIAGNOSIS — I119 Hypertensive heart disease without heart failure: Secondary | ICD-10-CM | POA: Diagnosis present

## 2012-10-27 DIAGNOSIS — K439 Ventral hernia without obstruction or gangrene: Secondary | ICD-10-CM | POA: Diagnosis not present

## 2012-10-27 DIAGNOSIS — R1110 Vomiting, unspecified: Secondary | ICD-10-CM

## 2012-10-27 DIAGNOSIS — Z6828 Body mass index (BMI) 28.0-28.9, adult: Secondary | ICD-10-CM | POA: Diagnosis not present

## 2012-10-27 DIAGNOSIS — A088 Other specified intestinal infections: Principal | ICD-10-CM | POA: Diagnosis present

## 2012-10-27 DIAGNOSIS — I10 Essential (primary) hypertension: Secondary | ICD-10-CM

## 2012-10-27 DIAGNOSIS — Z8719 Personal history of other diseases of the digestive system: Secondary | ICD-10-CM

## 2012-10-27 DIAGNOSIS — Z87891 Personal history of nicotine dependence: Secondary | ICD-10-CM | POA: Diagnosis not present

## 2012-10-27 DIAGNOSIS — I6529 Occlusion and stenosis of unspecified carotid artery: Secondary | ICD-10-CM

## 2012-10-27 DIAGNOSIS — K5289 Other specified noninfective gastroenteritis and colitis: Secondary | ICD-10-CM

## 2012-10-27 DIAGNOSIS — Z79899 Other long term (current) drug therapy: Secondary | ICD-10-CM

## 2012-10-27 DIAGNOSIS — K219 Gastro-esophageal reflux disease without esophagitis: Secondary | ICD-10-CM | POA: Diagnosis present

## 2012-10-27 DIAGNOSIS — R42 Dizziness and giddiness: Secondary | ICD-10-CM

## 2012-10-27 DIAGNOSIS — N179 Acute kidney failure, unspecified: Secondary | ICD-10-CM | POA: Diagnosis present

## 2012-10-27 DIAGNOSIS — D62 Acute posthemorrhagic anemia: Secondary | ICD-10-CM

## 2012-10-27 DIAGNOSIS — R51 Headache: Secondary | ICD-10-CM | POA: Diagnosis not present

## 2012-10-27 DIAGNOSIS — M109 Gout, unspecified: Secondary | ICD-10-CM | POA: Diagnosis present

## 2012-10-27 DIAGNOSIS — K429 Umbilical hernia without obstruction or gangrene: Secondary | ICD-10-CM | POA: Diagnosis not present

## 2012-10-27 HISTORY — DX: Gout, unspecified: M10.9

## 2012-10-27 HISTORY — DX: Personal history of other venous thrombosis and embolism: Z86.718

## 2012-10-27 LAB — COMPREHENSIVE METABOLIC PANEL
Albumin: 4.1 g/dL (ref 3.5–5.2)
Alkaline Phosphatase: 85 U/L (ref 39–117)
BUN: 50 mg/dL — ABNORMAL HIGH (ref 6–23)
Calcium: 9.6 mg/dL (ref 8.4–10.5)
Creatinine, Ser: 1.49 mg/dL — ABNORMAL HIGH (ref 0.50–1.10)
GFR calc Af Amer: 38 mL/min — ABNORMAL LOW (ref >90)
Glucose, Bld: 175 mg/dL — ABNORMAL HIGH (ref 70–99)
Total Protein: 7.7 g/dL (ref 6.0–8.3)

## 2012-10-27 LAB — CBC WITH DIFFERENTIAL/PLATELET
Basophils Relative: 0 % (ref 0–1)
Eosinophils Absolute: 0 10*3/uL (ref 0.0–0.7)
Eosinophils Relative: 0 % (ref 0–5)
Hemoglobin: 16.1 g/dL — ABNORMAL HIGH (ref 12.0–15.0)
Lymphs Abs: 0.4 10*3/uL — ABNORMAL LOW (ref 0.7–4.0)
MCH: 31.4 pg (ref 26.0–34.0)
MCHC: 35.2 g/dL (ref 30.0–36.0)
MCV: 89.5 fL (ref 78.0–100.0)
Monocytes Absolute: 1 10*3/uL (ref 0.1–1.0)
Monocytes Relative: 5 % (ref 3–12)
Neutrophils Relative %: 92 % — ABNORMAL HIGH (ref 43–77)
RBC: 5.12 MIL/uL — ABNORMAL HIGH (ref 3.87–5.11)

## 2012-10-27 LAB — LIPASE, BLOOD: Lipase: 27 U/L (ref 11–59)

## 2012-10-27 LAB — URINALYSIS, ROUTINE W REFLEX MICROSCOPIC
Bilirubin Urine: NEGATIVE
Leukocytes, UA: NEGATIVE
Nitrite: NEGATIVE
Specific Gravity, Urine: 1.02 (ref 1.005–1.030)
Urobilinogen, UA: 0.2 mg/dL (ref 0.0–1.0)
pH: 5.5 (ref 5.0–8.0)

## 2012-10-27 LAB — URINE MICROSCOPIC-ADD ON

## 2012-10-27 LAB — HEPATIC FUNCTION PANEL
ALT: 32 U/L (ref 0–35)
AST: 32 U/L (ref 0–37)
Albumin: 4 g/dL (ref 3.5–5.2)
Bilirubin, Direct: 0.2 mg/dL (ref 0.0–0.3)
Total Bilirubin: 0.9 mg/dL (ref 0.3–1.2)

## 2012-10-27 LAB — MAGNESIUM: Magnesium: 1.7 mg/dL (ref 1.5–2.5)

## 2012-10-27 LAB — APTT: aPTT: 38 seconds — ABNORMAL HIGH (ref 24–37)

## 2012-10-27 LAB — LACTIC ACID, PLASMA: Lactic Acid, Venous: 1.5 mmol/L (ref 0.5–2.2)

## 2012-10-27 MED ORDER — SODIUM CHLORIDE 0.9 % IJ SOLN
3.0000 mL | Freq: Two times a day (BID) | INTRAMUSCULAR | Status: DC
  Administered 2012-10-27 – 2012-10-31 (×4): 3 mL via INTRAVENOUS
  Filled 2012-10-27: qty 3

## 2012-10-27 MED ORDER — ATORVASTATIN CALCIUM 40 MG PO TABS
40.0000 mg | ORAL_TABLET | Freq: Every day | ORAL | Status: DC
  Administered 2012-10-28 – 2012-10-31 (×4): 40 mg via ORAL
  Filled 2012-10-27 (×4): qty 1

## 2012-10-27 MED ORDER — POTASSIUM CHLORIDE 10 MEQ/100ML IV SOLN
10.0000 meq | INTRAVENOUS | Status: AC
  Administered 2012-10-27 (×2): 10 meq via INTRAVENOUS
  Filled 2012-10-27: qty 100

## 2012-10-27 MED ORDER — MORPHINE SULFATE 2 MG/ML IJ SOLN
2.0000 mg | Freq: Once | INTRAMUSCULAR | Status: AC
  Administered 2012-10-27: 2 mg via INTRAVENOUS
  Filled 2012-10-27: qty 1

## 2012-10-27 MED ORDER — ONDANSETRON HCL 4 MG/2ML IJ SOLN
4.0000 mg | INTRAMUSCULAR | Status: DC | PRN
  Administered 2012-10-29 – 2012-10-31 (×2): 4 mg via INTRAVENOUS
  Filled 2012-10-27 (×3): qty 2

## 2012-10-27 MED ORDER — SODIUM CHLORIDE 0.9 % IV SOLN
1000.0000 mL | Freq: Once | INTRAVENOUS | Status: AC
  Administered 2012-10-27: 1000 mL via INTRAVENOUS

## 2012-10-27 MED ORDER — HYDROMORPHONE HCL PF 1 MG/ML IJ SOLN
0.5000 mg | INTRAMUSCULAR | Status: DC | PRN
  Administered 2012-10-28 – 2012-10-31 (×13): 0.5 mg via INTRAVENOUS
  Filled 2012-10-27 (×11): qty 1

## 2012-10-27 MED ORDER — COLCHICINE 0.6 MG PO TABS: 0.6000 mg | ORAL_TABLET | Freq: Two times a day (BID) | ORAL | Status: DC | PRN

## 2012-10-27 MED ORDER — SODIUM CHLORIDE 0.9 % IV BOLUS (SEPSIS)
1000.0000 mL | Freq: Once | INTRAVENOUS | Status: AC
  Administered 2012-10-27: 1000 mL via INTRAVENOUS

## 2012-10-27 MED ORDER — WARFARIN - PHARMACIST DOSING INPATIENT: Freq: Every day | Status: DC

## 2012-10-27 MED ORDER — CIPROFLOXACIN IN D5W 400 MG/200ML IV SOLN
400.0000 mg | INTRAVENOUS | Status: DC
  Administered 2012-10-28 – 2012-10-30 (×3): 400 mg via INTRAVENOUS
  Filled 2012-10-27 (×3): qty 200

## 2012-10-27 MED ORDER — METRONIDAZOLE IN NACL 5-0.79 MG/ML-% IV SOLN
500.0000 mg | Freq: Once | INTRAVENOUS | Status: AC
  Administered 2012-10-27: 500 mg via INTRAVENOUS
  Filled 2012-10-27: qty 100

## 2012-10-27 MED ORDER — ONDANSETRON HCL 4 MG/2ML IJ SOLN
4.0000 mg | Freq: Once | INTRAMUSCULAR | Status: AC
  Administered 2012-10-27: 4 mg via INTRAVENOUS
  Filled 2012-10-27: qty 2

## 2012-10-27 MED ORDER — SODIUM CHLORIDE 0.9 % IV SOLN
INTRAVENOUS | Status: DC
  Administered 2012-10-27: 75 mL/h via INTRAVENOUS

## 2012-10-27 MED ORDER — CIPROFLOXACIN IN D5W 400 MG/200ML IV SOLN
400.0000 mg | Freq: Once | INTRAVENOUS | Status: AC
  Administered 2012-10-27: 400 mg via INTRAVENOUS
  Filled 2012-10-27: qty 200

## 2012-10-27 MED ORDER — WARFARIN SODIUM 2 MG PO TABS
4.0000 mg | ORAL_TABLET | Freq: Once | ORAL | Status: AC
  Administered 2012-10-28: 4 mg via ORAL
  Filled 2012-10-27: qty 2

## 2012-10-27 MED ORDER — POTASSIUM CHLORIDE IN NACL 20-0.9 MEQ/L-% IV SOLN
INTRAVENOUS | Status: DC
  Administered 2012-10-27 – 2012-10-28 (×2): via INTRAVENOUS

## 2012-10-27 MED ORDER — METRONIDAZOLE IN NACL 5-0.79 MG/ML-% IV SOLN
500.0000 mg | Freq: Three times a day (TID) | INTRAVENOUS | Status: DC
  Administered 2012-10-28 – 2012-10-31 (×10): 500 mg via INTRAVENOUS
  Filled 2012-10-27 (×11): qty 100

## 2012-10-27 NOTE — ED Notes (Signed)
Patient arrives via EMS with c/o emesis and diarrhea since yesterday. Only c/o "a little" abdominal tenderness, diffuse pain. Patient alert/oriented.

## 2012-10-27 NOTE — Progress Notes (Signed)
ANTICOAGULATION CONSULT NOTE - Initial Consult  Pharmacy Consult for Warfarin Indication: VTE  Allergies  Allergen Reactions  . Alendronate Sodium Other (See Comments)    Irritates esophagus and ulcers  . Buffered Aspirin Other (See Comments)    On blood thinners but doctor has started aspirin as stroke therapy  . Ibuprofen Other (See Comments)    Blood thinner therapy  . Prednisone Other (See Comments)    Patient states due to osteoporosis  . Vioxx (Rofecoxib) Other (See Comments)    Unknown by patient    Patient Measurements: Height: 5\' 2"  (157.5 cm) Weight: 159 lb 4.8 oz (72.258 kg) IBW/kg (Calculated) : 50.1   Vital Signs: Temp: 98.1 F (36.7 C) (11/18 2028) Temp src: Oral (11/18 2028) BP: 92/50 mmHg (11/18 2028) Pulse Rate: 95  (11/18 2028)  Labs:  Basename 10/27/12 1946 10/27/12 1705  HGB -- 16.1*  HCT -- 45.8  PLT -- 254  APTT 38* --  LABPROT 19.7* --  INR 1.73* --  HEPARINUNFRC -- --  CREATININE -- 1.49*  CKTOTAL -- --  CKMB -- --  TROPONINI -- --    Estimated Creatinine Clearance: 29 ml/min (by C-G formula based on Cr of 1.49).   Medical History: Past Medical History  Diagnosis Date  . Migraines   . Lower back pain     disc   . Osteoporosis   . Hyperlipidemia   . Hypertension   . Urethral stenosis   . Aortic aneurysm   . Cardiac aneurysm     ventrral - bypass   . History of aorto-femoral bypass   . Pancreatitis   . Stricture esophagus     Hx  . GERD (gastroesophageal reflux disease)   . Carotid bruit     bil carotid bruits   . Peptic ulcer     Hx   . Gout     Medications:  Prescriptions prior to admission  Medication Sig Dispense Refill  . amLODipine (NORVASC) 5 MG tablet Take 5 mg by mouth daily.        [name] atorvastatin (LIPITOR) 40 MG tablet Take 40 mg by mouth daily.        . Calcium Carbonate 1500 MG TABS Take 1 tablet by mouth every morning.      . colchicine 0.6 MG tablet Take 0.6-1.2 mg by mouth 2 (two) times daily as  needed. For gout pain. Take two tablets by mouth at onset of pain, may take one additional tablet later if needed.      . fish oil-omega-3 fatty acids 1000 MG capsule Take 1 g by mouth every morning.       [name] HYDROcodone-acetaminophen (NORCO/VICODIN) 5-325 MG per tablet Take 1 tablet by mouth every 6 (six) hours as needed. For pain      . metoprolol (LOPRESSOR) 50 MG tablet Take 50 mg by mouth 2 (two) times daily.        [name] triamterene-hydrochlorothiazide (MAXZIDE-25) 37.5-25 MG per tablet Take 1 tablet by mouth daily.        [name] warfarin (COUMADIN) 4 MG tablet Take 4 mg by mouth as directed.         Assessment: Okay for Protocol INR < 2.0  Goal of Therapy:  INR 2-3  Plan:  Warfarin 4mg  PO x 1. Daily PT/INR  [name] 10/27/2012,10:47 PM

## 2012-10-27 NOTE — ED Provider Notes (Signed)
History     CSN: [id number]  Arrival date & time 10/27/12  1608   First MD Initiated Contact with Patient 10/27/12 1625      Chief Complaint  Patient presents with  . Emesis  . Diarrhea    (Consider location/radiation/quality/duration/timing/severity/associated sxs/prior treatment) HPI Comments: Patient presents here with LLQ pain, n/v/d since last night.  All have been non-bloody.  No fevers or chills.  No urinary complaints.  She has had multiple surgeries in the past including gallbladder, appendectomy, CABG.    Patient is a 78 y.o. female presenting with vomiting and diarrhea. The history is provided by the patient.  Emesis  This is a new problem. The current episode started yesterday. The problem occurs continuously. The problem has been gradually worsening. The emesis has an appearance of stomach contents. There has been no fever. Associated symptoms include diarrhea.  Diarrhea The primary symptoms include vomiting and diarrhea.    Past Medical History  Diagnosis Date  . Migraines   . Lower back pain     disc   . Osteoporosis   . Hyperlipidemia   . Hypertension   . Urethral stenosis   . Aortic aneurysm   . Cardiac aneurysm     ventrral - bypass   . History of aorto-femoral bypass   . Pancreatitis   . Stricture esophagus     Hx  . GERD (gastroesophageal reflux disease)   . Carotid bruit     bil carotid bruits   . Peptic ulcer     Hx     Past Surgical History  Procedure Date  . Esophageal dilation 1996  . Lumbar disc surgery 1993  . Partial hysterectomy 1966    single oopherectomy (right)   . Aortic & cardiac aneurysm rupture 04/1998  . Aorta - bilateral femoral artery bypass graft 7/99    Dr. Madilyn Fireman   . Carotid endarterectomy July 2003    Left  . Carotid endarterectomy 11/18/2007    Left    No family history on file.  History  Substance Use Topics  . Smoking status: Former Smoker    Quit date: 04/06/1998  . Smokeless tobacco: Not on file  .  Alcohol Use: No    OB History    Grav Para Term Preterm Abortions TAB SAB Ect Mult Living                  Review of Systems  Gastrointestinal: Positive for vomiting and diarrhea.  All other systems reviewed and are negative.    Allergies  Alendronate sodium; Buffered aspirin; Ibuprofen; Prednisone; and Vioxx  Home Medications   Current Outpatient Rx  Name  Route  Sig  Dispense  Refill  . AMLODIPINE BESYLATE 5 MG PO TABS   Oral   Take 5 mg by mouth daily.           . ATORVASTATIN CALCIUM 40 MG PO TABS   Oral   Take 40 mg by mouth daily.           [name] CALCIUM CARBONATE 1500 MG PO TABS   Oral   Take 1 tablet by mouth every morning.         [name] COLCHICINE 0.6 MG PO TABS   Oral   Take 0.6-1.2 mg by mouth 2 (two) times daily as needed. For gout pain. Take two tablets by mouth at onset of pain, may take one additional tablet later if needed.         . OMEGA-3 FATTY  ACIDS 1000 MG PO CAPS   Oral   Take 1 g by mouth every morning.          [name] HYDROCODONE-ACETAMINOPHEN 5-325 MG PO TABS   Oral   Take 1 tablet by mouth every 6 (six) hours as needed. For pain         . METOPROLOL TARTRATE 50 MG PO TABS   Oral   Take 50 mg by mouth 2 (two) times daily.           . TRIAMTERENE-HCTZ 37.5-25 MG PO TABS   Oral   Take 1 tablet by mouth daily.           . WARFARIN SODIUM 4 MG PO TABS   Oral   Take 4 mg by mouth as directed.            BP 110/48  Pulse 113  Temp 98.2 F (36.8 C) (Oral)  Resp 18  Ht 5\' 2"  (1.575 m)  Wt 155 lb (70.308 kg)  BMI 28.35 kg/m2  SpO2 97%  Physical Exam  Nursing note and vitals reviewed. Constitutional: She is oriented to person, place, and time. She appears well-developed and well-nourished. No distress.  HENT:  Head: Normocephalic and atraumatic.  Mouth/Throat: Oropharynx is clear and moist.  Neck: Normal range of motion. Neck supple.  Cardiovascular: Normal rate and regular rhythm.   No murmur heard. Pulmonary/Chest:  Effort normal and breath sounds normal.  Abdominal: Soft. Bowel sounds are normal. She exhibits no distension. There is no tenderness.  Musculoskeletal: Normal range of motion. She exhibits no edema.  Lymphadenopathy:    She has no cervical adenopathy.  Neurological: She is alert and oriented to person, place, and time. No cranial nerve deficit. She exhibits normal muscle tone. Coordination normal.  Skin: Skin is warm and dry. She is not diaphoretic.    ED Course  Procedures (including critical care time)   Labs Reviewed  CBC WITH DIFFERENTIAL  COMPREHENSIVE METABOLIC PANEL  LIPASE, BLOOD   No results found.   No diagnosis found.    MDM  The patient presents here with left sided abd pain, n/v/d since yesterday.  Workup reveals an elevated wbc, arf, and the ct shows a dilated thickened loop of bowel consistent with inflammatory bowel disease or infection.  I tend to favor infection.  She was given cipro and flagyl and I have consulted [facility]ad for admission.        Geoffery Lyons, MD 10/27/12 2151

## 2012-10-27 NOTE — ED Notes (Signed)
Family at bedside. Patient does not need anything at this time. 

## 2012-10-28 ENCOUNTER — Encounter (HOSPITAL_COMMUNITY): Payer: Self-pay | Admitting: Gastroenterology

## 2012-10-28 DIAGNOSIS — I10 Essential (primary) hypertension: Secondary | ICD-10-CM | POA: Diagnosis not present

## 2012-10-28 DIAGNOSIS — N179 Acute kidney failure, unspecified: Secondary | ICD-10-CM | POA: Diagnosis not present

## 2012-10-28 DIAGNOSIS — K5289 Other specified noninfective gastroenteritis and colitis: Secondary | ICD-10-CM

## 2012-10-28 LAB — PROTIME-INR: Prothrombin Time: 18.8 seconds — ABNORMAL HIGH (ref 11.6–15.2)

## 2012-10-28 LAB — CBC
Hemoglobin: 12 g/dL (ref 12.0–15.0)
MCH: 31.2 pg (ref 26.0–34.0)
MCHC: 34.7 g/dL (ref 30.0–36.0)
MCV: 89.9 fL (ref 78.0–100.0)
Platelets: 197 10*3/uL (ref 150–400)

## 2012-10-28 LAB — BASIC METABOLIC PANEL
BUN: 36 mg/dL — ABNORMAL HIGH (ref 6–23)
CO2: 19 mEq/L (ref 19–32)
Calcium: 7.2 mg/dL — ABNORMAL LOW (ref 8.4–10.5)
GFR calc non Af Amer: 46 mL/min — ABNORMAL LOW (ref >90)
Glucose, Bld: 95 mg/dL (ref 70–99)

## 2012-10-28 MED ORDER — TRAMADOL HCL 50 MG PO TABS
50.0000 mg | ORAL_TABLET | Freq: Four times a day (QID) | ORAL | Status: DC | PRN
  Administered 2012-10-28 – 2012-10-31 (×4): 50 mg via ORAL
  Filled 2012-10-28 (×5): qty 1

## 2012-10-28 MED ORDER — PANTOPRAZOLE SODIUM 40 MG PO TBEC
40.0000 mg | DELAYED_RELEASE_TABLET | Freq: Every day | ORAL | Status: DC
  Administered 2012-10-28 – 2012-11-01 (×5): 40 mg via ORAL
  Filled 2012-10-28 (×5): qty 1

## 2012-10-28 MED ORDER — KCL IN DEXTROSE-NACL 40-5-0.9 MEQ/L-%-% IV SOLN
INTRAVENOUS | Status: DC
  Administered 2012-10-28 – 2012-10-30 (×5): via INTRAVENOUS
  Filled 2012-10-28 (×15): qty 1000

## 2012-10-28 MED ORDER — WARFARIN SODIUM 5 MG PO TABS
5.0000 mg | ORAL_TABLET | Freq: Once | ORAL | Status: AC
  Administered 2012-10-28: 5 mg via ORAL
  Filled 2012-10-28: qty 1

## 2012-10-28 NOTE — Care Management Note (Signed)
    Page 1 of 1   10/31/2012     3:33:02 PM   CARE MANAGEMENT NOTE 10/31/2012  Patient:  [name], [name]   Account Number:  [id number]  Date Initiated:  10/28/2012  Documentation initiated by:  Rosemary Holms  Subjective/Objective Assessment:   Pt admitted from home where she lives with her spouse. Pt admitted with ileitis. On chronic coumadin. Plans to return home with spouse at DC. No HH needs identified.     Action/Plan:   Anticipated DC Date:  10/31/2012   Anticipated DC Plan:  HOME/SELF CARE      DC Planning Services  CM consult      Choice offered to / List presented to:             Status of service:  Completed, signed off Medicare Important Message given?  YES (If response is "NO", the following Medicare IM given date fields will be blank) Date Medicare IM given:  10/31/2012 Date Additional Medicare IM given:    Discharge Disposition:    Per UR Regulation:    If discussed at Long Length of Stay Meetings, dates discussed:    Comments:  10/28/12 1400 Kysa Calais Leanord Hawking RN BSN CM

## 2012-10-28 NOTE — Progress Notes (Signed)
UR Chart Review Completed  

## 2012-10-28 NOTE — Progress Notes (Signed)
ANTICOAGULATION CONSULT NOTE  Pharmacy Consult for Warfarin Indication: VTE, Hx vascular thrombosis.  Allergies  Allergen Reactions  . Alendronate Sodium Other (See Comments)    Irritates esophagus and ulcers  . Buffered Aspirin Other (See Comments)    On blood thinners but doctor has started aspirin as stroke therapy  . Ibuprofen Other (See Comments)    Blood thinner therapy  . Prednisone Other (See Comments)    Patient states due to osteoporosis  . Vioxx (Rofecoxib) Other (See Comments)    Unknown by patient    Patient Measurements: Height: 5\' 2"  (157.5 cm) Weight: 159 lb 14.4 oz (72.53 kg) IBW/kg (Calculated) : 50.1   Vital Signs: Temp: 98.4 F (36.9 C) (11/19 0808) Temp src: Oral (11/19 0808) BP: 109/41 mmHg (11/19 0847) Pulse Rate: 57  (11/19 0847)  Labs:  Basename 10/28/12 0501 10/27/12 1946 10/27/12 1705  HGB 12.0 -- 16.1*  HCT 34.6* -- 45.8  PLT 197 -- 254  APTT -- 38* --  LABPROT 18.8* 19.7* --  INR 1.63* 1.73* --  HEPARINUNFRC -- -- --  CREATININE 1.11* -- 1.49*  CKTOTAL -- -- --  CKMB -- -- --  TROPONINI -- -- --    Estimated Creatinine Clearance: 39 ml/min (by C-G formula based on Cr of 1.11).   Medical History: Past Medical History  Diagnosis Date  . Migraines   . Lower back pain     disc   . Osteoporosis   . Hyperlipidemia   . Hypertension   . Urethral stenosis   . Aortic aneurysm   . Cardiac aneurysm     ventrral - bypass   . History of aorto-femoral bypass   . Pancreatitis   . Stricture esophagus     Hx  . GERD (gastroesophageal reflux disease)   . Carotid bruit     bil carotid bruits   . Peptic ulcer     Hx   . Gout     Medications:  Prescriptions prior to admission  Medication Sig Dispense Refill  . amLODipine (NORVASC) 5 MG tablet Take 5 mg by mouth daily.        [name] atorvastatin (LIPITOR) 40 MG tablet Take 40 mg by mouth daily.        . Calcium Carbonate 1500 MG TABS Take 1 tablet by mouth every morning.      .  colchicine 0.6 MG tablet Take 0.6-1.2 mg by mouth 2 (two) times daily as needed. For gout pain. Take two tablets by mouth at onset of pain, may take one additional tablet later if needed.      . fish oil-omega-3 fatty acids 1000 MG capsule Take 1 g by mouth every morning.       [name] HYDROcodone-acetaminophen (NORCO/VICODIN) 5-325 MG per tablet Take 1 tablet by mouth every 6 (six) hours as needed. For pain      . metoprolol (LOPRESSOR) 50 MG tablet Take 50 mg by mouth 2 (two) times daily.        [name] triamterene-hydrochlorothiazide (MAXZIDE-25) 37.5-25 MG per tablet Take 1 tablet by mouth daily.        [name] warfarin (COUMADIN) 4 MG tablet Take 4 mg by mouth as directed.         Assessment: Okay for Protocol INR < 2.0 Patient on Cipro and Flagyl which can increase INR.  Goal of Therapy:  INR 2-3  Plan:  Warfarin 5mg  PO x 1. Daily PT/INR  Mady Gemma 10/28/2012,9:25 AM

## 2012-10-28 NOTE — Progress Notes (Signed)
Subjective: This lady was admitted yesterday with a picture of gastroenteritis. She now has a headache. The nausea seems to be better but the diarrhea continues. She has not had any rectal bleeding. She has abnormal CT abdominal scan suggestive of possible ischemia although the clinical picture does not seem to support this.           Physical Exam: Blood pressure 109/41, pulse 57, temperature 98.4 F (36.9 C), temperature source Oral, resp. rate 20, height 5\' 2"  (1.575 m), weight 72.53 kg (159 lb 14.4 oz), SpO2 94.00%. She looks systemically well. Is not clinically shocked. Abdomen is soft and largely nontender. Bowel sounds are heard. She is alert and orientated. Lung fields are clear.        Basic Metabolic Panel:  Basename 10/28/12 0501 10/27/12 2012 10/27/12 1705  NA 138 -- 138  K 3.2* -- 3.2*  CL 109 -- 100  CO2 19 -- 21  GLUCOSE 95 -- 175*  BUN 36* -- 50*  CREATININE 1.11* -- 1.49*  CALCIUM 7.2* -- 9.6  MG -- 1.7 --  PHOS -- -- --   Liver Function Tests:  [facility] 10/27/12 2216 10/27/12 1705  AST 32 30  ALT 32 33  ALKPHOS 83 85  BILITOT 0.9 1.0  PROT 7.7 7.7  ALBUMIN 4.0 4.1     CBC:  Basename 10/28/12 0501 10/27/12 1705  WBC 8.2 18.9*  NEUTROABS -- 17.4*  HGB 12.0 16.1*  HCT 34.6* 45.8  MCV 89.9 89.5  PLT 197 254    Ct Abdomen Pelvis Wo Contrast  10/27/2012  *RADIOLOGY REPORT*  Clinical Data:  Left lower quadrant pain question diverticulitis; nausea, vomiting, diarrhea since 10/26/2012; past history appendectomy, cholecystectomy, hysterectomy, aortic surgery  CT ABDOMEN AND PELVIS WITHOUT CONTRAST  Technique:  Multidetector CT imaging of the abdomen and pelvis was performed following the standard protocol without intravenous contrast. Sagittal and coronal MPR images reconstructed from axial data set. Dilute oral contrast was administered for exam.  Comparison: CT abdomen/pelvis 05/10/2005, CT chest 01/15/2004  Findings: 5 mm right lower  lobe nodule image 1, unchanged since 01/15/2004. Prior median sternotomy. Calcification adjacent to the cardiac apex unchanged, question prior pseudoaneurysm. Extensive atherosclerotic calcifications with evidence of prior aortobifemoral bypass. Within limits of a nonenhanced exam, no focal abnormalities of the spleen, pancreas, adrenal glands or right kidney. Scattered cortical scarring and question small cysts within left kidney.  Foci of gas are seen within the liver, question biliary origin, recommend correlation with history of sphincterotomy. Moderate sized hiatal hernia. Two ventral hernias are seen, both supraumbilical, 3.9 cm diameter fascial defect image 35 and 2.2 cm diameter defect image 46. Dilatation of proximal small bowel loops with segment of small bowel wall thickening particularly in the left mid abdomen. Distally small bowel loops are slightly smaller in caliber.  Minimal diverticulosis of the sigmoid colon. No bowel herniation identified. No mass, adenopathy, or free fluid. Bones appear diffusely demineralized. Old appearing compression deformity L2 unchanged since 2006.  IMPRESSION: Gas within the liver likely pneumobilia, recommend correlation for history of sphincterotomy; no other features of portal venous gas or mesenteric gas are identified. Umbilical and ventral hernias as above. Segmental wall thickening of a jejunal loop in the left mid abdomen compatible with enteritis, question infection versus inflammatory bowel disease though ischemia not completely excluded. Stable right lower lobe nodule. Minimal sigmoid diverticulosis. Prior abdominal aortic bypass grafting.  Findings called to Dr. Judd Lien on 10/27/2012 at 1900 hours.   Original Report  Authenticated By: Ulyses Southward, M.D.       Medications: I have reviewed the patient's current medications.  Impression: 1. Acute gastroenteritis, mostly enteritis. Abnormal CT abdominal scan suggestive of possible ischemia although clinically  she does not have this. 2. Acute renal failure secondary to dehydration and hypovolemia, improving. 3. Hypokalemia. 4. Hypertension. 5. Cardiac disease, stable.     Plan: 1. Continue IV fluids and replete potassium. 2. Continue with intravenous antibiotics. 3. Gastroenterology consultation     LOS: 1 day   Wilson Singer Pager [phone]  10/28/2012, 10:08 AM

## 2012-10-28 NOTE — H&P (Signed)
[facility] History and Physical  [name]  ZOX:[id number]  DOB: February 10, 1932   DOA: 10/27/2012   PCP:   Rudi Heap, MD   Chief Complaint:  Vomiting and diarrhea for 2 days  HPI: [name] is an 78 y.o. female.   [name] with a history of multiple vascular problems, including cardiac and aortic aneurysm status post repair, coronary artery disease, peripheral vascular disease, carotid artery disease status post endarterectomy. She is on chronic warfarin anticoagulation because of a history of vascular thrombosis.  She reports that she been having steady abdominal pain for the past 2 days associated with vomiting and diarrhea, and headaches.  She is reports of both vomitus and diarrhea consistent with watery yellow material with no blood. This she feels subjectively that she has been having fever and chills.  She denies chest pains or shortness of breath she denies lower extremity edema she denies exposure to sick contacts or recent antibiotic usage.  CT scan in the emergency room done without contrast shows evidence of thickening of the ileum suggestive of inflammatory or infectious enteritis.  Rewiew of Systems:   All systems negative except as marked bold or noted in the HPI;  Constitutional: Negative for malaise; fever and chills. ;  Eyes: Negative for eye pain, redness and discharge. ;  ENMT: Negative for ear pain, hoarseness, nasal congestion, sinus pressure and sore throat. ;  Cardiovascular: Negative for chest pain, palpitations, diaphoresis, dyspnea and peripheral edema. ;  Respiratory: Negative for cough, hemoptysis, wheezing and stridor. ;  Gastrointestinal: Negative for nausea, constipation, melena, blood in stool, hematemesis, jaundice and rectal bleeding. unusual weight loss..   Genitourinary: Negative for frequency, dysuria, incontinence,flank pain and hematuria; Musculoskeletal: Negative for back pain and neck pain. Negative for swelling and  trauma.;  Skin: . Negative for pruritus, rash, abrasions, bruising and skin lesion.; ulcerations Neuro: Negative for headache, lightheadedness and neck stiffness. Negative for weakness, altered level of consciousness , altered mental status, extremity weakness, burning feet, involuntary movement, seizure and syncope.  Psych: negative for anxiety, depression, insomnia, tearfulness, panic attacks, hallucinations, paranoia, suicidal or homicidal ideation   Past Medical History  Diagnosis Date  . Migraines   . Lower back pain     disc   . Osteoporosis   . Hyperlipidemia   . Hypertension   . Urethral stenosis   . Aortic aneurysm   . Cardiac aneurysm     ventrral - bypass   . History of aorto-femoral bypass   . Pancreatitis   . Stricture esophagus     Hx  . GERD (gastroesophageal reflux disease)   . Carotid bruit     bil carotid bruits   . Peptic ulcer     Hx   . Gout     Past Surgical History  Procedure Date  . Esophageal dilation 1996  . Lumbar disc surgery 1993  . Partial hysterectomy 1966    single oopherectomy (right)   . Aortic & cardiac aneurysm rupture 04/1998  . Aorta - bilateral femoral artery bypass graft 7/99    Dr. Madilyn Fireman   . Carotid endarterectomy July 2003    Left  . Carotid endarterectomy 11/18/2007    Left    Medications:  HOME MEDS: Prior to Admission medications   Medication Sig Start Date End Date Taking? Authorizing Provider  amLODipine (NORVASC) 5 MG tablet Take 5 mg by mouth daily.     Yes Historical Provider, MD  atorvastatin (LIPITOR) 40 MG tablet Take 40  mg by mouth daily.     Yes Historical Provider, MD  Calcium Carbonate 1500 MG TABS Take 1 tablet by mouth every morning. 08/09/11  Yes Henderson Cloud, MD  colchicine 0.6 MG tablet Take 0.6-1.2 mg by mouth 2 (two) times daily as needed. For gout pain. Take two tablets by mouth at onset of pain, may take one additional tablet later if needed.   Yes Historical Provider, MD  fish  oil-omega-3 fatty acids 1000 MG capsule Take 1 g by mouth every morning.    Yes Historical Provider, MD  HYDROcodone-acetaminophen (NORCO/VICODIN) 5-325 MG per tablet Take 1 tablet by mouth every 6 (six) hours as needed. For pain   Yes Historical Provider, MD  metoprolol (LOPRESSOR) 50 MG tablet Take 50 mg by mouth 2 (two) times daily.     Yes Historical Provider, MD  triamterene-hydrochlorothiazide (MAXZIDE-25) 37.5-25 MG per tablet Take 1 tablet by mouth daily.     Yes Historical Provider, MD  warfarin (COUMADIN) 4 MG tablet Take 4 mg by mouth as directed.    Yes Historical Provider, MD     Allergies:  Allergies  Allergen Reactions  . Alendronate Sodium Other (See Comments)    Irritates esophagus and ulcers  . Buffered Aspirin Other (See Comments)    On blood thinners but doctor has started aspirin as stroke therapy  . Ibuprofen Other (See Comments)    Blood thinner therapy  . Prednisone Other (See Comments)    Patient states due to osteoporosis  . Vioxx (Rofecoxib) Other (See Comments)    Unknown by patient    Social History:   reports that she quit smoking about 14 years ago. She does not have any smokeless tobacco history on file. She reports that she does not drink alcohol or use illicit drugs.  Family History: Family History  Problem Relation Age of Onset  . CAD Father     died age 16+  . Osteoarthritis Sister     paraplegic due to disk rupture     Physical Exam: Filed Vitals:   10/27/12 2028 10/28/12 0244 10/28/12 0447 10/28/12 0449  BP: 92/50 101/45 113/64 132/62  Pulse: 95 89 92 88  Temp: 98.1 F (36.7 C) 98.3 F (36.8 C) 97.6 F (36.4 C) 98.2 F (36.8 C)  TempSrc: Oral Oral Oral Oral  Resp:  18 18 20   Height: 5\' 2"  (1.575 m)     Weight: 72.258 kg (159 lb 4.8 oz)     SpO2: 96% 95% 94% 94%   Blood pressure 132/62, pulse 88, temperature 98.2 F (36.8 C), temperature source Oral, resp. rate 20, height 5\' 2"  (1.575 m), weight 72.258 kg (159 lb 4.8 oz), SpO2  94.00%.  GEN:  Pleasant [name] lying in the stretcher in no acute distress; cooperative with exam PSYCH:  alert and oriented x4; does not appear anxious or depressed; affect is appropriate. HEENT: Mucous membranes pink and anicteric; PERRLA; EOM intact; no cervical lymphadenopathy nor thyromegaly;  bilateral carotid bruit; no JVD; Breasts:: Not examined CHEST WALL: No tenderness CHEST: Normal respiration, clear to auscultation bilaterally HEART: Regular rate and rhythm; 3/6 early systolic murmur at the right upper sternal BACK: No kyphosis or scoliosis; no CVA tenderness ABDOMEN: Obese, soft non-tender; no masses, no organomegaly, normal abdominal bowel sounds; no pannus; no intertriginous candida. Rectal Exam: Not done EXTREMITIES: age-appropriate arthropathy of the hands and knees; no edema; no ulcerations. Genitalia: not examined PULSES: 2+ and symmetric SKIN: Normal hydration no rash or ulceration  CNS: Cranial nerves 2-12 grossly intact no focal lateralizing neurologic deficit   Labs on Admission:  Basic Metabolic Panel:  Lab 10/27/12 1914 10/27/12 1705  NA -- 138  K -- 3.2*  CL -- 100  CO2 -- 21  GLUCOSE -- 175*  BUN -- 50*  CREATININE -- 1.49*  CALCIUM -- 9.6  MG 1.7 --  PHOS -- --   Liver Function Tests:  Lab 10/27/12 2216 10/27/12 1705  AST 32 30  ALT 32 33  ALKPHOS 83 85  BILITOT 0.9 1.0  PROT 7.7 7.7  ALBUMIN 4.0 4.1    Lab 10/27/12 1705  LIPASE 27  AMYLASE --   No results found for this basename: AMMONIA:5 in the last 168 hours CBC:  Lab 10/27/12 1705  WBC 18.9*  NEUTROABS 17.4*  HGB 16.1*  HCT 45.8  MCV 89.5  PLT 254   Cardiac Enzymes: No results found for this basename: CKTOTAL:5,CKMB:5,CKMBINDEX:5,TROPONINI:5 in the last 168 hours BNP: No components found with this basename: POCBNP:5 D-dimer: No components found with this basename: D-DIMER:5 CBG: No results found for this basename: GLUCAP:5 in the last 168  hours  Radiological Exams on Admission: Ct Abdomen Pelvis Wo Contrast  10/27/2012  *RADIOLOGY REPORT*  Clinical Data:  Left lower quadrant pain question diverticulitis; nausea, vomiting, diarrhea since 10/26/2012; past history appendectomy, cholecystectomy, hysterectomy, aortic surgery  CT ABDOMEN AND PELVIS WITHOUT CONTRAST  Technique:  Multidetector CT imaging of the abdomen and pelvis was performed following the standard protocol without intravenous contrast. Sagittal and coronal MPR images reconstructed from axial data set. Dilute oral contrast was administered for exam.  Comparison: CT abdomen/pelvis 05/10/2005, CT chest 01/15/2004  Findings: 5 mm right lower lobe nodule image 1, unchanged since 01/15/2004. Prior median sternotomy. Calcification adjacent to the cardiac apex unchanged, question prior pseudoaneurysm. Extensive atherosclerotic calcifications with evidence of prior aortobifemoral bypass. Within limits of a nonenhanced exam, no focal abnormalities of the spleen, pancreas, adrenal glands or right kidney. Scattered cortical scarring and question small cysts within left kidney.  Foci of gas are seen within the liver, question biliary origin, recommend correlation with history of sphincterotomy. Moderate sized hiatal hernia. Two ventral hernias are seen, both supraumbilical, 3.9 cm diameter fascial defect image 35 and 2.2 cm diameter defect image 46. Dilatation of proximal small bowel loops with segment of small bowel wall thickening particularly in the left mid abdomen. Distally small bowel loops are slightly smaller in caliber.  Minimal diverticulosis of the sigmoid colon. No bowel herniation identified. No mass, adenopathy, or free fluid. Bones appear diffusely demineralized. Old appearing compression deformity L2 unchanged since 2006.  IMPRESSION: Gas within the liver likely pneumobilia, recommend correlation for history of sphincterotomy; no other features of portal venous gas or mesenteric  gas are identified. Umbilical and ventral hernias as above. Segmental wall thickening of a jejunal loop in the left mid abdomen compatible with enteritis, question infection versus inflammatory bowel disease though ischemia not completely excluded. Stable right lower lobe nodule. Minimal sigmoid diverticulosis. Prior abdominal aortic bypass grafting.  Findings called to Dr. Judd Lien on 10/27/2012 at 1900 hours.   Original Report Authenticated By: Ulyses Southward, M.D.      Assessment/Plan Present on Admission:  . Enteritis . Acute renal failure, due to  . Vomiting and diarrhea . Hypokalemia  . Cerebrovascular disease . CAD (coronary artery disease) . PVD (peripheral vascular disease) . Benign hypertension . Ventricular aneurysm   PLAN: We'll admit this lady for intravenous fluid rehydration and potassium repletion. We'll  empirically start antibiotic coverage, Cipro and Flagyl, given the history of fever and chills and a markedly elevated but there is also likelihood that this is ischemic ileitis. Her INR is subtherapeutic and we'll ask pharmacy to make adjustment to warfarin  Check C. difficile PCR  She is somewhat hypotensive so will hold her blood pressure medications, For the time being.  She'll likely benefit from a GI consult   Other plans as per orders.  Code Status: FULL CODE  Family Communication: Plans were discussed with the patient Disposition Plan: Discharge home when a more definitive plan of care is been established after assessment of response to therapy and consults    Olson Lucarelli Nocturnist [facility] Pager [phone]   10/28/2012, 4:55 AM

## 2012-10-28 NOTE — Consult Note (Signed)
Referring Provider: No ref. provider found Primary Care Physician:  Rudi Heap, MD Primary Gastroenterologist:  Dr. Darrick Penna   Date of Admission: 10/27/12 Date of Consultation: 10/28/12  Reason for Consultation:  Gastroenteritis   HPI:  78 year old female presenting to ED on 11/18 secondary to abdominal pain, nausea/vomiting, and diarrhea. CT without contrast notes segmental wall thickening of jejunal loop in left mid-abdomen, compatible with enteritis (infection vs IBD), unable to exclude ischemia. Further results as noted below. Contacted Dr. Rito Ehrlich, radiologist who noted no significant finding to definitively call this ischemia; however, patient does have know vascular disease as evidenced in PMH. Due to lack of contrast, specifics of mesenteric vasculature difficult to comment on.   Notes acute onset of vomiting Sunday night, followed by watery, yellow diarrhea. Now brown. Notes 4 loose stools from 5am to 8am. Has gone 2-3 times since this morning. Noted associated lower abdominal cramping. States diarrhea not as frequent as it was. Associated chills with onset of illness. Went to PCP yesterday, told she was dehydrated, sent to ED. No bright red blood per rectum. Denies any sick contacts. Went to husband's sister's assisted living facility on Saturday. Noted sign on facility door noting an "infection". No recent abx. Denies nausea currently. +headache.   Last colonoscopy in [place] around 6 years ago. Denies hx of polyps. Notes also hx of dysphagia, with multiple dilations in past.   Past Medical History  Diagnosis Date  . Migraines   . Lower back pain     disc   . Osteoporosis   . Hyperlipidemia   . Hypertension   . Urethral stenosis   . Aortic aneurysm   . Cardiac aneurysm     ventrral - bypass   . History of aorto-femoral bypass   . Pancreatitis   . Stricture esophagus     Hx  . GERD (gastroesophageal reflux disease)   . Carotid bruit     bil carotid bruits   . Peptic  ulcer     Hx   . Gout   . H/O blood clots     Past Surgical History  Procedure Date  . Esophageal dilation 1996  . Lumbar disc surgery 1993  . Partial hysterectomy 1966    single oopherectomy (right)   . Aortic & cardiac aneurysm rupture 04/1998  . Aorta - bilateral femoral artery bypass graft 7/99    Dr. Madilyn Fireman   . Carotid endarterectomy July 2003    Left  . Carotid endarterectomy 11/18/2007    Left    Prior to Admission medications   Medication Sig Start Date End Date Taking? Authorizing Provider  amLODipine (NORVASC) 5 MG tablet Take 5 mg by mouth daily.     Yes Historical Provider, MD  atorvastatin (LIPITOR) 40 MG tablet Take 40 mg by mouth daily.     Yes Historical Provider, MD  Calcium Carbonate 1500 MG TABS Take 1 tablet by mouth every morning. 08/09/11  Yes Henderson Cloud, MD  colchicine 0.6 MG tablet Take 0.6-1.2 mg by mouth 2 (two) times daily as needed. For gout pain. Take two tablets by mouth at onset of pain, may take one additional tablet later if needed.   Yes Historical Provider, MD  fish oil-omega-3 fatty acids 1000 MG capsule Take 1 g by mouth every morning.    Yes Historical Provider, MD  HYDROcodone-acetaminophen (NORCO/VICODIN) 5-325 MG per tablet Take 1 tablet by mouth every 6 (six) hours as needed. For pain   Yes Historical Provider, MD  metoprolol (  LOPRESSOR) 50 MG tablet Take 50 mg by mouth 2 (two) times daily.     Yes Historical Provider, MD  triamterene-hydrochlorothiazide (MAXZIDE-25) 37.5-25 MG per tablet Take 1 tablet by mouth daily.     Yes Historical Provider, MD  warfarin (COUMADIN) 4 MG tablet Take 4 mg by mouth as directed.    Yes Historical Provider, MD    Current Facility-Administered Medications  Medication Dose Route Frequency Provider Last Rate Last Dose  . [COMPLETED] 0.9 %  sodium chloride infusion  1,000 mL Intravenous Once Geoffery Lyons, MD   1,000 mL at 10/27/12 1900  . atorvastatin (LIPITOR) tablet 40 mg  40 mg Oral q1800  Vania Rea, MD      . [address] ciprofloxacin (CIPRO) IVPB 400 mg  400 mg Intravenous Once Geoffery Lyons, MD 200 mL/hr at 10/27/12 2006 400 mg at 10/27/12 2006  . ciprofloxacin (CIPRO) IVPB 400 mg  400 mg Intravenous Q24H Vania Rea, MD      . colchicine tablet 0.6-1.2 mg  0.6-1.2 mg Oral BID PRN Vania Rea, MD      . dextrose 5 % and 0.9 % NaCl with KCl 40 mEq/L infusion   Intravenous Continuous Wilson Singer, MD 125 mL/hr at 10/28/12 1051    . HYDROmorphone (DILAUDID) injection 0.5 mg  0.5 mg Intravenous Q2H PRN Vania Rea, MD   0.5 mg at 10/28/12 1112  . [COMPLETED] metroNIDAZOLE (FLAGYL) IVPB 500 mg  500 mg Intravenous Once Geoffery Lyons, MD 100 mL/hr at 10/27/12 2006 500 mg at 10/27/12 2006  . metroNIDAZOLE (FLAGYL) IVPB 500 mg  500 mg Intravenous Q8H Vania Rea, MD   500 mg at 10/28/12 1255  . [COMPLETED] morphine 2 MG/ML injection 2 mg  2 mg Intravenous Once Geoffery Lyons, MD   2 mg at 10/27/12 1703  . [COMPLETED] ondansetron (ZOFRAN) injection 4 mg  4 mg Intravenous Once Geoffery Lyons, MD   4 mg at 10/27/12 1703  . ondansetron (ZOFRAN) injection 4 mg  4 mg Intravenous Q4H PRN Vania Rea, MD      . pantoprazole (PROTONIX) EC tablet 40 mg  40 mg Oral QAC breakfast West Bali, MD      . [EXPIRED] potassium chloride 10 mEq in 100 mL IVPB  10 mEq Intravenous Q1 Hr x 3 Vania Rea, MD   10 mEq at 10/27/12 2234  . [COMPLETED] sodium chloride 0.9 % bolus 1,000 mL  1,000 mL Intravenous Once Geoffery Lyons, MD   1,000 mL at 10/27/12 1700  . sodium chloride 0.9 % injection 3 mL  3 mL Intravenous Q12H Vania Rea, MD   3 mL at 10/27/12 2235  . traMADol (ULTRAM) tablet 50 mg  50 mg Oral Q6H PRN Nimish Normajean Glasgow, MD      . [COMPLETED] warfarin (COUMADIN) tablet 4 mg  4 mg Oral Once Vania Rea, MD   4 mg at 10/28/12 0146  . warfarin (COUMADIN) tablet 5 mg  5 mg Oral ONCE-1800 Nimish Normajean Glasgow, MD      . Warfarin - Pharmacist Dosing Inpatient   Does  not apply q1800 Vania Rea, MD      . [COMPLETED] 0.9 %  sodium chloride infusion   Intravenous STAT Geoffery Lyons, MD 75 mL/hr at 10/27/12 2011 75 mL/hr at 10/27/12 2011  . [DISCONTINUED] 0.9 % NaCl with KCl 20 mEq/ L  infusion   Intravenous Continuous Vania Rea, MD 125 mL/hr at 10/28/12 0659      Allergies as of 10/27/2012 - Review  Complete 10/27/2012  Allergen Reaction Noted  . Alendronate sodium Other (See Comments) 04/26/2011  . Buffered aspirin Other (See Comments) 04/26/2011  . Ibuprofen Other (See Comments) 04/26/2011  . Prednisone Other (See Comments) 04/26/2011  . Vioxx (rofecoxib) Other (See Comments) 04/26/2011    Family History  Problem Relation Age of Onset  . CAD Father     died age 2+  . Osteoarthritis Sister     paraplegic due to disk rupture  . Colon cancer Neg Hx     History   Social History  . Marital Status: Married    Spouse Name: N/A    Number of Children: N/A  . Years of Education: N/A   Occupational History  . retired     [name] in [place], [place]   Social History Main Topics  . Smoking status: Former Smoker    Quit date: 04/06/1998  . Smokeless tobacco: Not on file  . Alcohol Use: No  . Drug Use: No  . Sexually Active: Not on file   Other Topics Concern  . Not on file   Social History Narrative  . No narrative on file    Review of Systems: Gen: SEE HPI CV: Denies chest pain, heart palpitations, syncope, edema  Resp: +SOB GI: Denies dysphagia or odynophagia. Denies vomiting blood, jaundice, and fecal incontinence.  GU : Denies urinary burning, urinary frequency, urinary incontinence.  MS: Denies joint pain,swelling, cramping Derm: Denies rash, itching, dry skin Psych: Denies depression, anxiety,confusion, or memory loss Heme: Denies bruising, bleeding, and enlarged lymph nodes.  Physical Exam: Vital signs in last 24 hours: Temp:  [97.6 F (36.4 C)-99 F (37.2 C)] 97.7 F (36.5 C) (11/19 1345) Pulse Rate:  [57-113]  88  (11/19 1345) Resp:  [18-20] 18  (11/19 1345) BP: (61-132)/(41-68) 126/46 mmHg (11/19 1345) SpO2:  [91 %-100 %] 96 % (11/19 1345) Weight:  [155 lb (70.308 kg)-159 lb 14.4 oz (72.53 kg)] 159 lb 14.4 oz (72.53 kg) (11/19 0501) Last BM Date: 10/28/12 General:   Alert,  Well-developed, well-nourished, pleasant and cooperative Head:  Normocephalic and atraumatic. Eyes:  Sclera clear, no icterus.   Conjunctiva pink. Ears:  Normal auditory acuity. Nose:  No deformity, discharge,  or lesions. Mouth:  No deformity or lesions, dentition normal. Neck:  Supple; no masses or thyromegaly. Lungs:  Clear throughout to auscultation.   No wheezes, crackles, or rhonchi. No acute distress. Heart:  Regular rate and rhythm; no murmurs, clicks, rubs,  or gallops. Abdomen:  Soft,mild TTP RUQ/RLQ and nondistended. No masses, hepatosplenomegaly or hernias noted. Normal bowel sounds, without guarding, and without rebound.   Rectal:  Deferred  Msk:  Symmetrical without gross deformities. Normal posture. Extremities:  Without clubbing or edema. Neurologic:  Alert and  oriented x4;  grossly normal neurologically. Skin:  Intact without significant lesions or rashes. Cervical Nodes:  No significant cervical adenopathy. Psych:  Alert and cooperative. Normal mood and affect.  Intake/Output from previous day: 11/18 0701 - 11/19 0700 In: 200 [IV Piggyback:200] Out: -  Intake/Output this shift:    Lab Results:  Basename 10/28/12 0501 10/27/12 1705  WBC 8.2 18.9*  HGB 12.0 16.1*  HCT 34.6* 45.8  PLT 197 254   BMET  Basename 10/28/12 0501 10/27/12 1705  NA 138 138  K 3.2* 3.2*  CL 109 100  CO2 19 21  GLUCOSE 95 175*  BUN 36* 50*  CREATININE 1.11* 1.49*  CALCIUM 7.2* 9.6   LFT  Basename 10/27/12 2216 10/27/12 1705  PROT 7.7 7.7  ALBUMIN 4.0 4.1  AST 32 30  ALT 32 33  ALKPHOS 83 85  BILITOT 0.9 1.0  BILIDIR 0.2 --  IBILI 0.7 --   PT/INR  Basename 10/28/12 0501 10/27/12 1946  LABPROT 18.8*  19.7*  INR 1.63* 1.73*    Studies/Results: Ct Abdomen Pelvis Wo Contrast  10/27/2012  *RADIOLOGY REPORT*  Clinical Data:  Left lower quadrant pain question diverticulitis; nausea, vomiting, diarrhea since 10/26/2012; past history appendectomy, cholecystectomy, hysterectomy, aortic surgery  CT ABDOMEN AND PELVIS WITHOUT CONTRAST  Technique:  Multidetector CT imaging of the abdomen and pelvis was performed following the standard protocol without intravenous contrast. Sagittal and coronal MPR images reconstructed from axial data set. Dilute oral contrast was administered for exam.  Comparison: CT abdomen/pelvis 05/10/2005, CT chest 01/15/2004  Findings: 5 mm right lower lobe nodule image 1, unchanged since 01/15/2004. Prior median sternotomy. Calcification adjacent to the cardiac apex unchanged, question prior pseudoaneurysm. Extensive atherosclerotic calcifications with evidence of prior aortobifemoral bypass. Within limits of a nonenhanced exam, no focal abnormalities of the spleen, pancreas, adrenal glands or right kidney. Scattered cortical scarring and question small cysts within left kidney.  Foci of gas are seen within the liver, question biliary origin, recommend correlation with history of sphincterotomy. Moderate sized hiatal hernia. Two ventral hernias are seen, both supraumbilical, 3.9 cm diameter fascial defect image 35 and 2.2 cm diameter defect image 46. Dilatation of proximal small bowel loops with segment of small bowel wall thickening particularly in the left mid abdomen. Distally small bowel loops are slightly smaller in caliber.  Minimal diverticulosis of the sigmoid colon. No bowel herniation identified. No mass, adenopathy, or free fluid. Bones appear diffusely demineralized. Old appearing compression deformity L2 unchanged since 2006.  IMPRESSION: Gas within the liver likely pneumobilia, recommend correlation for history of sphincterotomy; no other features of portal venous gas or  mesenteric gas are identified. Umbilical and ventral hernias as above. Segmental wall thickening of a jejunal loop in the left mid abdomen compatible with enteritis, question infection versus inflammatory bowel disease though ischemia not completely excluded. Stable right lower lobe nodule. Minimal sigmoid diverticulosis. Prior abdominal aortic bypass grafting.  Findings called to Dr. Judd Lien on 10/27/2012 at 1900 hours.   Original Report Authenticated By: Ulyses Southward, M.D.     Impression: 78 year old female admitted with acute onset of symptoms to include N/V/D. Leukocytosis resolved. Acute renal failure improving. CT as noted above, likely dealing with enteritis. Less likely ischemic etiology, although pt does have significant known vascular disease. Difficult to comment on mesenteric vasculature due to lack of contrast. Cdiff PCR not obtained yet. Slow improvement of symptoms since admission, with empiric abx and supportive measures. Add stool culture and PPI for prophylaxis.   Plan: PPI for prophylaxis Follow-up on Cdiff PCR, obtain stool culture as well Agree with empiric abx Supportive measures    LOS: 1 day   Gerrit Halls  10/28/2012, 3:18 PM

## 2012-10-29 DIAGNOSIS — K219 Gastro-esophageal reflux disease without esophagitis: Secondary | ICD-10-CM | POA: Diagnosis not present

## 2012-10-29 DIAGNOSIS — I10 Essential (primary) hypertension: Secondary | ICD-10-CM | POA: Diagnosis not present

## 2012-10-29 DIAGNOSIS — K5289 Other specified noninfective gastroenteritis and colitis: Secondary | ICD-10-CM | POA: Diagnosis not present

## 2012-10-29 LAB — CBC WITH DIFFERENTIAL/PLATELET
HCT: 32.3 % — ABNORMAL LOW (ref 36.0–46.0)
Hemoglobin: 11.1 g/dL — ABNORMAL LOW (ref 12.0–15.0)
Lymphocytes Relative: 13 % (ref 12–46)
Lymphs Abs: 0.8 10*3/uL (ref 0.7–4.0)
MCHC: 34.4 g/dL (ref 30.0–36.0)
Monocytes Absolute: 0.6 10*3/uL (ref 0.1–1.0)
Monocytes Relative: 10 % (ref 3–12)
Neutro Abs: 4.6 10*3/uL (ref 1.7–7.7)
Neutrophils Relative %: 75 % (ref 43–77)
RBC: 3.52 MIL/uL — ABNORMAL LOW (ref 3.87–5.11)

## 2012-10-29 LAB — BASIC METABOLIC PANEL
BUN: 13 mg/dL (ref 6–23)
CO2: 20 mEq/L (ref 19–32)
Chloride: 111 mEq/L (ref 96–112)
Creatinine, Ser: 0.83 mg/dL (ref 0.50–1.10)
GFR calc Af Amer: 76 mL/min — ABNORMAL LOW (ref >90)
Glucose, Bld: 119 mg/dL — ABNORMAL HIGH (ref 70–99)
Potassium: 3.5 mEq/L (ref 3.5–5.1)

## 2012-10-29 LAB — PROTIME-INR
INR: 1.35 (ref 0.00–1.49)
Prothrombin Time: 16.4 seconds — ABNORMAL HIGH (ref 11.6–15.2)

## 2012-10-29 MED ORDER — SODIUM CHLORIDE 0.9 % IJ SOLN
INTRAMUSCULAR | Status: AC
  Filled 2012-10-29: qty 3

## 2012-10-29 MED ORDER — WARFARIN SODIUM 5 MG PO TABS
5.0000 mg | ORAL_TABLET | Freq: Once | ORAL | Status: AC
  Administered 2012-10-29: 5 mg via ORAL
  Filled 2012-10-29: qty 1

## 2012-10-29 MED ORDER — HYDROMORPHONE HCL PF 1 MG/ML IJ SOLN
INTRAMUSCULAR | Status: AC
  Administered 2012-10-30: 0.5 mg via INTRAVENOUS
  Filled 2012-10-29: qty 1

## 2012-10-29 NOTE — Consult Note (Signed)
REVIEWED.  Sx improved. AWAIT STOOL STUDIES. FULL LIQUID DIET.

## 2012-10-29 NOTE — Progress Notes (Signed)
Subjective: Continued diarrhea, 4-6 overnight. Dark brown. No rectal bleeding noted. +nausea, "spit up" after eating. +headache. Denies abdominal pain currently. Few bites of grits and toast this morning, unsettled.   Objective: Vital signs in last 24 hours: Temp:  [97.6 F (36.4 C)-98.4 F (36.9 C)] 97.6 F (36.4 C) (11/20 0434) Pulse Rate:  [57-94] 91  (11/20 0444) Resp:  [18-20] 20  (11/20 0444) BP: (61-144)/(41-68) 90/47 mmHg (11/20 0444) SpO2:  [84 %-98 %] 97 % (11/20 0444) Weight:  [163 lb 1.6 oz (73.982 kg)] 163 lb 1.6 oz (73.982 kg) (11/20 0434) Last BM Date: 10/28/12 General:   Alert and oriented, pleasant Head:  Normocephalic and atraumatic. Eyes:  No icterus, sclera clear. Conjuctiva pink.  Heart:  S1, S2 present, no murmurs noted.  Lungs: Clear to auscultation bilaterally, without wheezing, rales, or rhonchi.  Abdomen:  Bowel sounds present, soft, TTP right lower quadrant, right-sided abdomen, non-distended. No HSM or hernias noted. No rebound or guarding. No masses appreciated  Extremities:  Without clubbing or edema. Neurologic:  Alert and  oriented x4;  grossly normal neurologically. Skin:  Warm and dry, intact without significant lesions.  Psych:  Alert and cooperative. Normal mood and affect.  Intake/Output from previous day: 11/19 0701 - 11/20 0700 In: 718.8 [P.O.:100; I.V.:518.8; IV Piggyback:100] Out: -  Intake/Output this shift:    Lab Results:  Basename 10/28/12 0501 10/27/12 1705  WBC 8.2 18.9*  HGB 12.0 16.1*  HCT 34.6* 45.8  PLT 197 254   BMET  Basename 10/28/12 0501 10/27/12 1705  NA 138 138  K 3.2* 3.2*  CL 109 100  CO2 19 21  GLUCOSE 95 175*  BUN 36* 50*  CREATININE 1.11* 1.49*  CALCIUM 7.2* 9.6   LFT  Basename 10/27/12 2216 10/27/12 1705  PROT 7.7 7.7  ALBUMIN 4.0 4.1  AST 32 30  ALT 32 33  ALKPHOS 83 85  BILITOT 0.9 1.0  BILIDIR 0.2 --  IBILI 0.7 --   PT/INR  Basename 10/29/12 0521 10/28/12 0501  LABPROT 16.4* 18.8*    INR 1.35 1.63*   C-Diff PCR NEGATIVE  Studies/Results: Ct Abdomen Pelvis Wo Contrast  10/27/2012  *RADIOLOGY REPORT*  Clinical Data:  Left lower quadrant pain question diverticulitis; nausea, vomiting, diarrhea since 10/26/2012; past history appendectomy, cholecystectomy, hysterectomy, aortic surgery  CT ABDOMEN AND PELVIS WITHOUT CONTRAST  Technique:  Multidetector CT imaging of the abdomen and pelvis was performed following the standard protocol without intravenous contrast. Sagittal and coronal MPR images reconstructed from axial data set. Dilute oral contrast was administered for exam.  Comparison: CT abdomen/pelvis 05/10/2005, CT chest 01/15/2004  Findings: 5 mm right lower lobe nodule image 1, unchanged since 01/15/2004. Prior median sternotomy. Calcification adjacent to the cardiac apex unchanged, question prior pseudoaneurysm. Extensive atherosclerotic calcifications with evidence of prior aortobifemoral bypass. Within limits of a nonenhanced exam, no focal abnormalities of the spleen, pancreas, adrenal glands or right kidney. Scattered cortical scarring and question small cysts within left kidney.  Foci of gas are seen within the liver, question biliary origin, recommend correlation with history of sphincterotomy. Moderate sized hiatal hernia. Two ventral hernias are seen, both supraumbilical, 3.9 cm diameter fascial defect image 35 and 2.2 cm diameter defect image 46. Dilatation of proximal small bowel loops with segment of small bowel wall thickening particularly in the left mid abdomen. Distally small bowel loops are slightly smaller in caliber.  Minimal diverticulosis of the sigmoid colon. No bowel herniation identified. No mass, adenopathy, or free fluid. Bones  appear diffusely demineralized. Old appearing compression deformity L2 unchanged since 2006.  IMPRESSION: Gas within the liver likely pneumobilia, recommend correlation for history of sphincterotomy; no other features of portal venous  gas or mesenteric gas are identified. Umbilical and ventral hernias as above. Segmental wall thickening of a jejunal loop in the left mid abdomen compatible with enteritis, question infection versus inflammatory bowel disease though ischemia not completely excluded. Stable right lower lobe nodule. Minimal sigmoid diverticulosis. Prior abdominal aortic bypass grafting.  Findings called to Dr. Judd Lien on 10/27/2012 at 1900 hours.   Original Report Authenticated By: Ulyses Southward, M.D.     Assessment: 78 year old female with significant hx of vascular disease and presentation of acute enteritis; Cdiff PCR negative, stool culture in process. Doubt dealing with ischemic etiology, although pt does have a significant hx. Slight improvement from admission, although diarrhea continues; notes N/V with advancement of diet. Add on ova and parasites for completeness' sake, repeat CBC and BMP this morning.   Plan: PPI for GI prophylaxis Follow-up on stool culture O&P CBC, BMP Decrease diet back to fulls, (currently on heart diet) slowly advance as tolerated to low-residue/heart healthy Supportive measures Further evaluation if no improvement    LOS: 2 days   Gerrit Halls  10/29/2012, 7:56 AM    Patient seen and examined this afternoon. She feels diarrhea may be slowing down-estimates about 4 stools thus far  far today. Still with some right mid abdominal discomfort with tenderness to palpation. Agree with continuing current plan of management for now.

## 2012-10-29 NOTE — Progress Notes (Signed)
ANTICOAGULATION CONSULT NOTE  Pharmacy Consult for Warfarin Indication: VTE, Hx vascular thrombosis.  Allergies  Allergen Reactions  . Alendronate Sodium Other (See Comments)    Irritates esophagus and ulcers  . Buffered Aspirin Other (See Comments)    On blood thinners but doctor has started aspirin as stroke therapy  . Ibuprofen Other (See Comments)    Blood thinner therapy  . Prednisone Other (See Comments)    Patient states due to osteoporosis  . Vioxx (Rofecoxib) Other (See Comments)    Unknown by patient    Patient Measurements: Height: 5\' 2"  (157.5 cm) Weight: 163 lb 1.6 oz (73.982 kg) IBW/kg (Calculated) : 50.1   Vital Signs: Temp: 97.6 F (36.4 C) (11/20 0434) Temp src: Oral (11/20 0434) BP: 90/47 mmHg (11/20 0444) Pulse Rate: 91  (11/20 0444)  Labs:  Basename 10/29/12 0521 10/28/12 0501 10/27/12 1946 10/27/12 1705  HGB 11.1* 12.0 -- --  HCT 32.3* 34.6* -- 45.8  PLT 174 197 -- 254  APTT -- -- 38* --  LABPROT 16.4* 18.8* 19.7* --  INR 1.35 1.63* 1.73* --  HEPARINUNFRC -- -- -- --  CREATININE 0.83 1.11* -- 1.49*  CKTOTAL -- -- -- --  CKMB -- -- -- --  TROPONINI -- -- -- --    Estimated Creatinine Clearance: 52.6 ml/min (by C-G formula based on Cr of 0.83).   Medical History: Past Medical History  Diagnosis Date  . Migraines   . Lower back pain     disc   . Osteoporosis   . Hyperlipidemia   . Hypertension   . Urethral stenosis   . Aortic aneurysm   . Cardiac aneurysm     ventrral - bypass   . History of aorto-femoral bypass   . Pancreatitis   . Stricture esophagus     Hx  . GERD (gastroesophageal reflux disease)   . Carotid bruit     bil carotid bruits   . Peptic ulcer     Hx   . Gout   . H/O blood clots     Medications:  Prescriptions prior to admission  Medication Sig Dispense Refill  . amLODipine (NORVASC) 5 MG tablet Take 5 mg by mouth daily.        [name] atorvastatin (LIPITOR) 40 MG tablet Take 40 mg by mouth daily.        .  Calcium Carbonate 1500 MG TABS Take 1 tablet by mouth every morning.      . colchicine 0.6 MG tablet Take 0.6-1.2 mg by mouth 2 (two) times daily as needed. For gout pain. Take two tablets by mouth at onset of pain, may take one additional tablet later if needed.      . fish oil-omega-3 fatty acids 1000 MG capsule Take 1 g by mouth every morning.       [name] HYDROcodone-acetaminophen (NORCO/VICODIN) 5-325 MG per tablet Take 1 tablet by mouth every 6 (six) hours as needed. For pain      . metoprolol (LOPRESSOR) 50 MG tablet Take 50 mg by mouth 2 (two) times daily.        [name] triamterene-hydrochlorothiazide (MAXZIDE-25) 37.5-25 MG per tablet Take 1 tablet by mouth daily.        [name] warfarin (COUMADIN) 4 MG tablet Take 4 mg by mouth as directed.         Assessment: No bleeding reported.  Being followed by GI.  Home dose noted as 4mg  daily.  INR below goal. INR < 2.0 Patient on Cipro  and Flagyl which can increase INR.  Goal of Therapy:  INR 2-3  Plan:  Warfarin 5mg  PO x 1. Daily PT/INR  Valrie Hart A 10/29/2012,10:27 AM

## 2012-10-29 NOTE — Progress Notes (Signed)
Chart reviewed.  Subjective: Still with frequent loose stool. No vomiting, but some acid reflux. Has gas pains. Tolerating just a few bites of solids. Drinking little  Objective: Vital signs in last 24 hours: Filed Vitals:   10/29/12 0250 10/29/12 0434 10/29/12 0441 10/29/12 0444  BP: 135/63 135/55 144/59 90/47  Pulse: 79 81 78 91  Temp: 98 F (36.7 C) 97.6 F (36.4 C)    TempSrc: Oral Oral    Resp: 20 20 20 20   Height:      Weight:  73.982 kg (163 lb 1.6 oz)    SpO2: 96% 94% 96% 97%   Weight change: 3.674 kg (8 lb 1.6 oz)  Intake/Output Summary (Last 24 hours) at 10/29/12 1353 Last data filed at 10/28/12 1741  Gross per 24 hour  Intake 618.75 ml  Output      0 ml  Net 618.75 ml   General: Alert. Comfortable lying supine. HEENT: Dry mucous membranes Lungs clear to auscultation bilaterally without wheeze rhonchi or rales Abdomen soft right lower quadrant tender. nondistended Extremities no clubbing cyanosis or edema  Lab Results: Basic Metabolic Panel:  Lab 10/29/12 9811 10/28/12 0501 10/27/12 2012  NA 139 138 --  K 3.5 3.2* --  CL 111 109 --  CO2 20 19 --  GLUCOSE 119* 95 --  BUN 13 36* --  CREATININE 0.83 1.11* --  CALCIUM 7.4* 7.2* --  MG -- -- 1.7  PHOS -- -- --   Liver Function Tests:  Lab 10/27/12 2216 10/27/12 1705  AST 32 30  ALT 32 33  ALKPHOS 83 85  BILITOT 0.9 1.0  PROT 7.7 7.7  ALBUMIN 4.0 4.1    Lab 10/27/12 1705  LIPASE 27  AMYLASE --   No results found for this basename: AMMONIA:2 in the last 168 hours CBC:  Lab 10/29/12 0521 10/28/12 0501 10/27/12 1705  WBC 6.0 8.2 --  NEUTROABS 4.6 -- 17.4*  HGB 11.1* 12.0 --  HCT 32.3* 34.6* --  MCV 91.8 89.9 --  PLT 174 197 --   Cardiac Enzymes: No results found for this basename: CKTOTAL:3,CKMB:3,CKMBINDEX:3,TROPONINI:3 in the last 168 hours BNP: No results found for this basename: PROBNP:3 in the last 168 hours D-Dimer: No results found for this basename: DDIMER:2 in the last 168  hours CBG: No results found for this basename: GLUCAP:6 in the last 168 hours Hemoglobin A1C: No results found for this basename: HGBA1C in the last 168 hours Fasting Lipid Panel: No results found for this basename: CHOL,HDL,LDLCALC,TRIG,CHOLHDL,LDLDIRECT in the last 914 hours Thyroid Function Tests:  Lab 10/27/12 2216  TSH 0.704  T4TOTAL --  FREET4 --  T3FREE --  THYROIDAB --   Coagulation:  Lab 10/29/12 0521 10/28/12 0501 10/27/12 1946  LABPROT 16.4* 18.8* 19.7*  INR 1.35 1.63* 1.73*   Anemia Panel: No results found for this basename: VITAMINB12,FOLATE,FERRITIN,TIBC,IRON,RETICCTPCT in the last 168 hours Urine Drug Screen: Drugs of Abuse  No results found for this basename: labopia, cocainscrnur, labbenz, amphetmu, thcu, labbarb    Alcohol Level: No results found for this basename: ETH:2 in the last 168 hours Urinalysis:  Lab 10/27/12 1754  COLORURINE YELLOW  LABSPEC 1.020  PHURINE 5.5  GLUCOSEU NEGATIVE  HGBUR SMALL*  BILIRUBINUR NEGATIVE  KETONESUR NEGATIVE  PROTEINUR NEGATIVE  UROBILINOGEN 0.2  NITRITE NEGATIVE  LEUKOCYTESUR NEGATIVE   Micro Results: Recent Results (from the past 240 hour(s))  CLOSTRIDIUM DIFFICILE BY PCR     Status: Normal   Collection Time   10/28/12 11:57 PM  Component Value Range Status Comment   C difficile by pcr NEGATIVE  NEGATIVE Final    Studies/Results: Ct Abdomen Pelvis Wo Contrast  10/27/2012  *RADIOLOGY REPORT*  Clinical Data:  Left lower quadrant pain question diverticulitis; nausea, vomiting, diarrhea since 10/26/2012; past history appendectomy, cholecystectomy, hysterectomy, aortic surgery  CT ABDOMEN AND PELVIS WITHOUT CONTRAST  Technique:  Multidetector CT imaging of the abdomen and pelvis was performed following the standard protocol without intravenous contrast. Sagittal and coronal MPR images reconstructed from axial data set. Dilute oral contrast was administered for exam.  Comparison: CT abdomen/pelvis 05/10/2005,  CT chest 01/15/2004  Findings: 5 mm right lower lobe nodule image 1, unchanged since 01/15/2004. Prior median sternotomy. Calcification adjacent to the cardiac apex unchanged, question prior pseudoaneurysm. Extensive atherosclerotic calcifications with evidence of prior aortobifemoral bypass. Within limits of a nonenhanced exam, no focal abnormalities of the spleen, pancreas, adrenal glands or right kidney. Scattered cortical scarring and question small cysts within left kidney.  Foci of gas are seen within the liver, question biliary origin, recommend correlation with history of sphincterotomy. Moderate sized hiatal hernia. Two ventral hernias are seen, both supraumbilical, 3.9 cm diameter fascial defect image 35 and 2.2 cm diameter defect image 46. Dilatation of proximal small bowel loops with segment of small bowel wall thickening particularly in the left mid abdomen. Distally small bowel loops are slightly smaller in caliber.  Minimal diverticulosis of the sigmoid colon. No bowel herniation identified. No mass, adenopathy, or free fluid. Bones appear diffusely demineralized. Old appearing compression deformity L2 unchanged since 2006.  IMPRESSION: Gas within the liver likely pneumobilia, recommend correlation for history of sphincterotomy; no other features of portal venous gas or mesenteric gas are identified. Umbilical and ventral hernias as above. Segmental wall thickening of a jejunal loop in the left mid abdomen compatible with enteritis, question infection versus inflammatory bowel disease though ischemia not completely excluded. Stable right lower lobe nodule. Minimal sigmoid diverticulosis. Prior abdominal aortic bypass grafting.  Findings called to Dr. Judd Lien on 10/27/2012 at 1900 hours.   Original Report Authenticated By: Ulyses Southward, M.D.    Scheduled Meds:   . atorvastatin  40 mg Oral q1800  . ciprofloxacin  400 mg Intravenous Q24H  . HYDROmorphone      . metronidazole  500 mg Intravenous Q8H  .  pantoprazole  40 mg Oral QAC breakfast  . sodium chloride  3 mL Intravenous Q12H  . [COMPLETED] warfarin  5 mg Oral ONCE-1800  . warfarin  5 mg Oral ONCE-1800  . Warfarin - Pharmacist Dosing Inpatient   Does not apply q1800   Continuous Infusions:   . dextrose 5 % and 0.9 % NaCl with KCl 40 mEq/L 125 mL/hr at 10/28/12 2056   PRN Meds:.colchicine, HYDROmorphone (DILAUDID) injection, ondansetron (ZOFRAN) IV, traMADol Assessment/Plan: Principal Problem:  *Enteritis Active Problems:  Chronic anticoagulation  Benign hypertension  Cerebrovascular disease  CAD (coronary artery disease)  PVD (peripheral vascular disease)  Ventricular aneurysm  GERD with stricture  Hypokalemia resolved  Continue empiric antibiotics. IV fluids. Appreciate GI. Coumadin per pharmacy.   LOS: 2 days   Doug Bucklin L 10/29/2012, 1:53 PM

## 2012-10-30 DIAGNOSIS — K5289 Other specified noninfective gastroenteritis and colitis: Secondary | ICD-10-CM

## 2012-10-30 DIAGNOSIS — I10 Essential (primary) hypertension: Secondary | ICD-10-CM | POA: Diagnosis not present

## 2012-10-30 DIAGNOSIS — Z7901 Long term (current) use of anticoagulants: Secondary | ICD-10-CM

## 2012-10-30 MED ORDER — METOPROLOL TARTRATE 50 MG PO TABS
50.0000 mg | ORAL_TABLET | Freq: Two times a day (BID) | ORAL | Status: DC
  Administered 2012-10-30 (×2): 50 mg via ORAL
  Filled 2012-10-30 (×2): qty 1

## 2012-10-30 MED ORDER — WARFARIN SODIUM 5 MG PO TABS
5.0000 mg | ORAL_TABLET | Freq: Once | ORAL | Status: AC
  Administered 2012-10-30: 5 mg via ORAL
  Filled 2012-10-30: qty 1

## 2012-10-30 MED ORDER — SODIUM CHLORIDE 0.9 % IJ SOLN
INTRAMUSCULAR | Status: AC
  Filled 2012-10-30: qty 3

## 2012-10-30 MED ORDER — TRAZODONE HCL 50 MG PO TABS: 50.0000 mg | ORAL_TABLET | Freq: Every evening | ORAL | Status: DC | PRN

## 2012-10-30 NOTE — Progress Notes (Addendum)
Subjective:  Slept better last night. Two stools recorded since midnight. Feels like stool less frequent but still very loose. Nonbloody. Less abdominal pain. No vomiting. Tired of full liquids.   Objective: Vital signs in last 24 hours: Temp:  [97.8 F (36.6 C)-98.8 F (37.1 C)] 98.5 F (36.9 C) (11/21 0440) Pulse Rate:  [67-97] 78  (11/21 0443) Resp:  [20] 20  (11/21 0440) BP: (132-163)/(57-77) 163/70 mmHg (11/21 0443) SpO2:  [94 %-96 %] 94 % (11/21 0443) Weight:  [158 lb 12.8 oz (72.031 kg)] 158 lb 12.8 oz (72.031 kg) (11/21 0440) Last BM Date: 10/29/12 General:   Alert,  Well-developed, well-nourished, pleasant and cooperative in NAD Head:  Normocephalic and atraumatic. Eyes:  Sclera clear, no icterus.   Abdomen:  Soft, mild right mid-abdominal tenderness, nondistended.  Normal bowel sounds, without guarding, and without rebound.   Extremities:  Without clubbing, deformity or edema. Neurologic:  Alert and  oriented x4;  grossly normal neurologically. Skin:  Intact without significant lesions or rashes. Psych:  Alert and cooperative. Normal mood and affect.  Intake/Output from previous day: 11/20 0701 - 11/21 0700 In: 720 [P.O.:720] Out: -  Intake/Output this shift:    Lab Results: CBC  Basename 10/29/12 0521 10/28/12 0501 10/27/12 1705  WBC 6.0 8.2 18.9*  HGB 11.1* 12.0 16.1*  HCT 32.3* 34.6* 45.8  MCV 91.8 89.9 89.5  PLT 174 197 254   BMET  Basename 10/29/12 0521 10/28/12 0501 10/27/12 1705  NA 139 138 138  K 3.5 3.2* 3.2*  CL 111 109 100  CO2 20 19 21   GLUCOSE 119* 95 175*  BUN 13 36* 50*  CREATININE 0.83 1.11* 1.49*  CALCIUM 7.4* 7.2* 9.6   LFTs  Basename 10/27/12 2216 10/27/12 1705  BILITOT 0.9 1.0  BILIDIR 0.2 --  IBILI 0.7 --  ALKPHOS 83 85  AST 32 30  ALT 32 33  PROT 7.7 7.7  ALBUMIN 4.0 4.1    Basename 10/27/12 1705  LIPASE 27   PT/INR  Basename 10/30/12 0523 10/29/12 0521 10/28/12 0501  LABPROT 19.5* 16.4* 18.8*  INR 1.71* 1.35  1.63*      Imaging Studies: Ct Abdomen Pelvis Wo Contrast  10/27/2012  *RADIOLOGY REPORT*  Clinical Data:  Left lower quadrant pain question diverticulitis; nausea, vomiting, diarrhea since 10/26/2012; past history appendectomy, cholecystectomy, hysterectomy, aortic surgery  CT ABDOMEN AND PELVIS WITHOUT CONTRAST  Technique:  Multidetector CT imaging of the abdomen and pelvis was performed following the standard protocol without intravenous contrast. Sagittal and coronal MPR images reconstructed from axial data set. Dilute oral contrast was administered for exam.  Comparison: CT abdomen/pelvis 05/10/2005, CT chest 01/15/2004  Findings: 5 mm right lower lobe nodule image 1, unchanged since 01/15/2004. Prior median sternotomy. Calcification adjacent to the cardiac apex unchanged, question prior pseudoaneurysm. Extensive atherosclerotic calcifications with evidence of prior aortobifemoral bypass. Within limits of a nonenhanced exam, no focal abnormalities of the spleen, pancreas, adrenal glands or right kidney. Scattered cortical scarring and question small cysts within left kidney.  Foci of gas are seen within the liver, question biliary origin, recommend correlation with history of sphincterotomy. Moderate sized hiatal hernia. Two ventral hernias are seen, both supraumbilical, 3.9 cm diameter fascial defect image 35 and 2.2 cm diameter defect image 46. Dilatation of proximal small bowel loops with segment of small bowel wall thickening particularly in the left mid abdomen. Distally small bowel loops are slightly smaller in caliber.  Minimal diverticulosis of the sigmoid colon. No bowel herniation identified.  No mass, adenopathy, or free fluid. Bones appear diffusely demineralized. Old appearing compression deformity L2 unchanged since 2006.  IMPRESSION: Gas within the liver likely pneumobilia, recommend correlation for history of sphincterotomy; no other features of portal venous gas or mesenteric gas are  identified. Umbilical and ventral hernias as above. Segmental wall thickening of a jejunal loop in the left mid abdomen compatible with enteritis, question infection versus inflammatory bowel disease though ischemia not completely excluded. Stable right lower lobe nodule. Minimal sigmoid diverticulosis. Prior abdominal aortic bypass grafting.  Findings called to Dr. Judd Lien on 10/27/2012 at 1900 hours.   Original Report Authenticated By: Ulyses Southward, M.D.   [2 weeks]   Assessment: 78 y/o female with significant h/o vascular disease and presentation of acute enteritis. Stool culture pending. O+P not yet collected. Clinically with continued mild improvement.   Plan: 1. F/u pending stools. 2. Low fat/low residue diet. No dairy. 3. Reevaluate after lunch.    LOS: 3 days   Tana [place]  10/30/2012, 8:12 AM   Tolerated [place] and dressing for lunch today. Stools tapering off. Tiny amount of air in the biliary tree of uncertain significance. Certainly, no evidence of pneumatosis otherwise. Would continue current plan of management; To be reassessed tomorrow morning.

## 2012-10-30 NOTE — Progress Notes (Addendum)
Subjective: Diarrhea less frequent. Abdominal pain remains, but slightly improved. Complains of headache. Appetite is still poor. Some nausea. Feels a little dizzy when standing. Weak.  Objective: Vital signs in last 24 hours: Filed Vitals:   10/30/12 0240 10/30/12 0440 10/30/12 0441 10/30/12 0443  BP: 141/57 150/77 135/70 163/70  Pulse: 79 67 78 78  Temp: 98.6 F (37 C) 98.5 F (36.9 C)    TempSrc: Oral Oral    Resp: 20 20    Height:      Weight:  72.031 kg (158 lb 12.8 oz)    SpO2: 95% 96% 94% 94%   Weight change: -1.95 kg (-4 lb 4.8 oz)  Intake/Output Summary (Last 24 hours) at 10/30/12 1322 Last data filed at 10/30/12 1240  Gross per 24 hour  Intake    480 ml  Output    750 ml  Net   -270 ml   General: Alert. Comfortable lying supine. HEENT: moist mucous membranes Lungs clear to auscultation bilaterally without wheeze rhonchi or rales Abdomen soft right lower quadrant tender. nondistended Extremities no clubbing cyanosis or edema  Lab Results: Basic Metabolic Panel:  Lab 10/29/12 4098 10/28/12 0501 10/27/12 2012  NA 139 138 --  K 3.5 3.2* --  CL 111 109 --  CO2 20 19 --  GLUCOSE 119* 95 --  BUN 13 36* --  CREATININE 0.83 1.11* --  CALCIUM 7.4* 7.2* --  MG -- -- 1.7  PHOS -- -- --   Liver Function Tests:  Lab 10/27/12 2216 10/27/12 1705  AST 32 30  ALT 32 33  ALKPHOS 83 85  BILITOT 0.9 1.0  PROT 7.7 7.7  ALBUMIN 4.0 4.1    Lab 10/27/12 1705  LIPASE 27  AMYLASE --   No results found for this basename: AMMONIA:2 in the last 168 hours CBC:  Lab 10/29/12 0521 10/28/12 0501 10/27/12 1705  WBC 6.0 8.2 --  NEUTROABS 4.6 -- 17.4*  HGB 11.1* 12.0 --  HCT 32.3* 34.6* --  MCV 91.8 89.9 --  PLT 174 197 --   Cardiac Enzymes: No results found for this basename: CKTOTAL:3,CKMB:3,CKMBINDEX:3,TROPONINI:3 in the last 168 hours BNP: No results found for this basename: PROBNP:3 in the last 168 hours D-Dimer: No results found for this basename: DDIMER:2  in the last 168 hours CBG: No results found for this basename: GLUCAP:6 in the last 168 hours Hemoglobin A1C: No results found for this basename: HGBA1C in the last 168 hours Fasting Lipid Panel: No results found for this basename: CHOL,HDL,LDLCALC,TRIG,CHOLHDL,LDLDIRECT in the last 119 hours Thyroid Function Tests:  Lab 10/27/12 2216  TSH 0.704  T4TOTAL --  FREET4 --  T3FREE --  THYROIDAB --   Coagulation:  Lab 10/30/12 0523 10/29/12 0521 10/28/12 0501 10/27/12 1946  LABPROT 19.5* 16.4* 18.8* 19.7*  INR 1.71* 1.35 1.63* 1.73*   Anemia Panel: No results found for this basename: VITAMINB12,FOLATE,FERRITIN,TIBC,IRON,RETICCTPCT in the last 168 hours Urine Drug Screen: Drugs of Abuse  No results found for this basename: labopia,  cocainscrnur,  labbenz,  amphetmu,  thcu,  labbarb    Alcohol Level: No results found for this basename: ETH:2 in the last 168 hours Urinalysis:  Lab 10/27/12 1754  COLORURINE YELLOW  LABSPEC 1.020  PHURINE 5.5  GLUCOSEU NEGATIVE  HGBUR SMALL*  BILIRUBINUR NEGATIVE  KETONESUR NEGATIVE  PROTEINUR NEGATIVE  UROBILINOGEN 0.2  NITRITE NEGATIVE  LEUKOCYTESUR NEGATIVE   Micro Results: Recent Results (from the past 240 hour(s))  CLOSTRIDIUM DIFFICILE BY PCR  Status: Normal   Collection Time   10/28/12 11:57 PM      Component Value Range Status Comment   C difficile by pcr NEGATIVE  NEGATIVE Final    Studies/Results: No results found. Scheduled Meds:    . atorvastatin  40 mg Oral q1800  . ciprofloxacin  400 mg Intravenous Q24H  . [EXPIRED] HYDROmorphone      . metronidazole  500 mg Intravenous Q8H  . pantoprazole  40 mg Oral QAC breakfast  . sodium chloride  3 mL Intravenous Q12H  . [EXPIRED] sodium chloride      . sodium chloride      . [COMPLETED] warfarin  5 mg Oral ONCE-1800  . warfarin  5 mg Oral ONCE-1800  . Warfarin - Pharmacist Dosing Inpatient   Does not apply q1800   Continuous Infusions:    . dextrose 5 % and 0.9 %  NaCl with KCl 40 mEq/L 125 mL/hr at 10/30/12 0810   PRN Meds:.colchicine, HYDROmorphone (DILAUDID) injection, ondansetron (ZOFRAN) IV, traMADol Assessment/Plan: Principal Problem:  *Enteritis Active Problems:  Chronic anticoagulation  Benign hypertension  Cerebrovascular disease  CAD (coronary artery disease)  PVD (peripheral vascular disease) GERD with stricture  Continue empiric antibiotics.  C. Difficile negative. Culture pending. Decrease IV to 75 cc an hour. Increase activity. Physical therapy consult.  Resume metoprolol   LOS: 3 days   [name] L 10/30/2012, 1:22 PM

## 2012-10-30 NOTE — Progress Notes (Signed)
ANTICOAGULATION CONSULT NOTE  Pharmacy Consult for Warfarin Indication: VTE, Hx vascular thrombosis.  Allergies  Allergen Reactions  . Alendronate Sodium Other (See Comments)    Irritates esophagus and ulcers  . Buffered Aspirin Other (See Comments)    On blood thinners but doctor has started aspirin as stroke therapy  . Ibuprofen Other (See Comments)    Blood thinner therapy  . Prednisone Other (See Comments)    Patient states due to osteoporosis  . Vioxx (Rofecoxib) Other (See Comments)    Unknown by patient    Patient Measurements: Height: 5\' 2"  (157.5 cm) Weight: 158 lb 12.8 oz (72.031 kg) IBW/kg (Calculated) : 50.1   Vital Signs: Temp: 98.5 F (36.9 C) (11/21 0440) Temp src: Oral (11/21 0440) BP: 163/70 mmHg (11/21 0443) Pulse Rate: 78  (11/21 0443)  Labs:  Basename 10/30/12 0523 10/29/12 0521 10/28/12 0501 10/27/12 1946 10/27/12 1705  HGB -- 11.1* 12.0 -- --  HCT -- 32.3* 34.6* -- 45.8  PLT -- 174 197 -- 254  APTT -- -- -- 38* --  LABPROT 19.5* 16.4* 18.8* -- --  INR 1.71* 1.35 1.63* -- --  HEPARINUNFRC -- -- -- -- --  CREATININE -- 0.83 1.11* -- 1.49*  CKTOTAL -- -- -- -- --  CKMB -- -- -- -- --  TROPONINI -- -- -- -- --    Estimated Creatinine Clearance: 51.9 ml/min (by C-G formula based on Cr of 0.83).   Medical History: Past Medical History  Diagnosis Date  . Migraines   . Lower back pain     disc   . Osteoporosis   . Hyperlipidemia   . Hypertension   . Urethral stenosis   . Aortic aneurysm   . Cardiac aneurysm     ventrral - bypass   . History of aorto-femoral bypass   . Pancreatitis   . Stricture esophagus     Hx  . GERD (gastroesophageal reflux disease)   . Carotid bruit     bil carotid bruits   . Peptic ulcer     Hx   . Gout   . H/O blood clots     Medications:  Prescriptions prior to admission  Medication Sig Dispense Refill  . amLODipine (NORVASC) 5 MG tablet Take 5 mg by mouth daily.        [name] atorvastatin (LIPITOR) 40 MG  tablet Take 40 mg by mouth daily.        . Calcium Carbonate 1500 MG TABS Take 1 tablet by mouth every morning.      . colchicine 0.6 MG tablet Take 0.6-1.2 mg by mouth 2 (two) times daily as needed. For gout pain. Take two tablets by mouth at onset of pain, may take one additional tablet later if needed.      . fish oil-omega-3 fatty acids 1000 MG capsule Take 1 g by mouth every morning.       [name] HYDROcodone-acetaminophen (NORCO/VICODIN) 5-325 MG per tablet Take 1 tablet by mouth every 6 (six) hours as needed. For pain      . metoprolol (LOPRESSOR) 50 MG tablet Take 50 mg by mouth 2 (two) times daily.        [name] triamterene-hydrochlorothiazide (MAXZIDE-25) 37.5-25 MG per tablet Take 1 tablet by mouth daily.        [name] warfarin (COUMADIN) 4 MG tablet Take 4 mg by mouth as directed.         Assessment: No bleeding reported.  Being followed by GI.  Home dose noted  as 4mg  daily.  INR below goal.  Pt c/o diarrhea which is slightly improved but non-bloody.  Sluggish INR response despite not eating very much.  Patient on Cipro and Flagyl which can increase INR.  Goal of Therapy:  INR 2-3  Plan:  Warfarin 5mg  PO x 1. Daily PT/INR  Valrie Hart A 10/30/2012,11:34 AM

## 2012-10-31 DIAGNOSIS — R51 Headache: Secondary | ICD-10-CM | POA: Diagnosis not present

## 2012-10-31 DIAGNOSIS — K5289 Other specified noninfective gastroenteritis and colitis: Secondary | ICD-10-CM | POA: Diagnosis not present

## 2012-10-31 DIAGNOSIS — R519 Headache, unspecified: Secondary | ICD-10-CM | POA: Diagnosis present

## 2012-10-31 DIAGNOSIS — I951 Orthostatic hypotension: Secondary | ICD-10-CM | POA: Diagnosis present

## 2012-10-31 LAB — PROTIME-INR
INR: 1.98 — ABNORMAL HIGH (ref 0.00–1.49)
Prothrombin Time: 21.7 seconds — ABNORMAL HIGH (ref 11.6–15.2)

## 2012-10-31 MED ORDER — HYDROCODONE-ACETAMINOPHEN 5-325 MG PO TABS
1.0000 | ORAL_TABLET | Freq: Four times a day (QID) | ORAL | Status: DC | PRN
  Administered 2012-10-31 – 2012-11-01 (×2): 1 via ORAL
  Filled 2012-10-31 (×2): qty 1

## 2012-10-31 MED ORDER — ONDANSETRON 4 MG PO TBDP
8.0000 mg | ORAL_TABLET | Freq: Three times a day (TID) | ORAL | Status: DC
  Administered 2012-10-31 – 2012-11-01 (×2): 8 mg via ORAL
  Filled 2012-10-31 (×2): qty 2

## 2012-10-31 MED ORDER — ACETAMINOPHEN 325 MG PO TABS
650.0000 mg | ORAL_TABLET | Freq: Four times a day (QID) | ORAL | Status: DC | PRN
  Filled 2012-10-31: qty 2

## 2012-10-31 MED ORDER — WARFARIN SODIUM 5 MG PO TABS
5.0000 mg | ORAL_TABLET | Freq: Once | ORAL | Status: AC
  Administered 2012-10-31: 5 mg via ORAL
  Filled 2012-10-31: qty 1

## 2012-10-31 MED ORDER — RISAQUAD PO CAPS
1.0000 | ORAL_CAPSULE | Freq: Every day | ORAL | Status: DC
  Administered 2012-10-31 – 2012-11-01 (×2): 1 via ORAL
  Filled 2012-10-31 (×2): qty 1

## 2012-10-31 MED ORDER — ONDANSETRON HCL 4 MG/2ML IJ SOLN
4.0000 mg | Freq: Three times a day (TID) | INTRAMUSCULAR | Status: DC
  Administered 2012-10-31: 4 mg via INTRAVENOUS
  Filled 2012-10-31: qty 2

## 2012-10-31 NOTE — Progress Notes (Signed)
Subjective:  States she has small amount of emesis after supper and breakfast. Still with 5-6 stools per day, ?slightly better. Abdominal pain slightly better.   Objective: Vital signs in last 24 hours: Temp:  [98.1 F (36.7 C)-98.8 F (37.1 C)] 98.6 F (37 C) (11/22 0512) Pulse Rate:  [75-84] 81  (11/22 0658) Resp:  [18-20] 19  (11/22 0512) BP: (82-164)/(50-77) 82/50 mmHg (11/22 0658) SpO2:  [94 %-95 %] 94 % (11/22 0512) Weight:  [159 lb 1.6 oz (72.167 kg)] 159 lb 1.6 oz (72.167 kg) (11/22 0512) Last BM Date: 10/30/12 General:   Alert,  Well-developed, well-nourished, pleasant and cooperative in NAD Head:  Normocephalic and atraumatic. Eyes:  Sclera clear, no icterus.   Abdomen:  Soft, nontender and nondistended. Normal bowel sounds, without guarding, and without rebound.   Extremities:  Without clubbing, deformity or edema. Neurologic:  Alert and  oriented x4;  grossly normal neurologically. Skin:  Intact without significant lesions or rashes. Psych:  Alert and cooperative. Normal mood and affect.  Intake/Output from previous day: 11/21 0701 - 11/22 0700 In: 1360 [P.O.:460; I.V.:900] Out: 950 [Urine:950] Intake/Output this shift:    Lab Results: CBC  Basename 10/29/12 0521  WBC 6.0  HGB 11.1*  HCT 32.3*  MCV 91.8  PLT 174   BMET  Basename 10/29/12 0521  NA 139  K 3.5  CL 111  CO2 20  GLUCOSE 119*  BUN 13  CREATININE 0.83  CALCIUM 7.4*   PT/INR  Basename 10/31/12 0514 10/30/12 0523 10/29/12 0521  LABPROT 21.7* 19.5* 16.4*  INR 1.98* 1.71* 1.35      Imaging Studies: Ct Abdomen Pelvis Wo Contrast  10/27/2012  *RADIOLOGY REPORT*  Clinical Data:  Left lower quadrant pain question diverticulitis; nausea, vomiting, diarrhea since 10/26/2012; past history appendectomy, cholecystectomy, hysterectomy, aortic surgery  CT ABDOMEN AND PELVIS WITHOUT CONTRAST  Technique:  Multidetector CT imaging of the abdomen and pelvis was performed following the standard  protocol without intravenous contrast. Sagittal and coronal MPR images reconstructed from axial data set. Dilute oral contrast was administered for exam.  Comparison: CT abdomen/pelvis 05/10/2005, CT chest 01/15/2004  Findings: 5 mm right lower lobe nodule image 1, unchanged since 01/15/2004. Prior median sternotomy. Calcification adjacent to the cardiac apex unchanged, question prior pseudoaneurysm. Extensive atherosclerotic calcifications with evidence of prior aortobifemoral bypass. Within limits of a nonenhanced exam, no focal abnormalities of the spleen, pancreas, adrenal glands or right kidney. Scattered cortical scarring and question small cysts within left kidney.  Foci of gas are seen within the liver, question biliary origin, recommend correlation with history of sphincterotomy. Moderate sized hiatal hernia. Two ventral hernias are seen, both supraumbilical, 3.9 cm diameter fascial defect image 35 and 2.2 cm diameter defect image 46. Dilatation of proximal small bowel loops with segment of small bowel wall thickening particularly in the left mid abdomen. Distally small bowel loops are slightly smaller in caliber.  Minimal diverticulosis of the sigmoid colon. No bowel herniation identified. No mass, adenopathy, or free fluid. Bones appear diffusely demineralized. Old appearing compression deformity L2 unchanged since 2006.  IMPRESSION: Gas within the liver likely pneumobilia, recommend correlation for history of sphincterotomy; no other features of portal venous gas or mesenteric gas are identified. Umbilical and ventral hernias as above. Segmental wall thickening of a jejunal loop in the left mid abdomen compatible with enteritis, question infection versus inflammatory bowel disease though ischemia not completely excluded. Stable right lower lobe nodule. Minimal sigmoid diverticulosis. Prior abdominal aortic bypass grafting.  Findings  called to Dr. Judd Lien on 10/27/2012 at 1900 hours.   Original Report  Authenticated By: Ulyses Southward, M.D.   [2 weeks]   Assessment: 78 y/o female with significant h/o vascular disease and presentation of acute enteritis. Stool culture pending but remains negative. Very slow to improve. Increased nausea with advanced diet.   Plan: 1. To discuss with Dr. Darrick Penna regarding any need for change in management.    LOS: 4 days   Tana Coast  10/31/2012, 7:53 AM

## 2012-10-31 NOTE — Progress Notes (Signed)
ANTICOAGULATION CONSULT NOTE  Pharmacy Consult for Warfarin Indication: VTE, Hx vascular thrombosis.  Allergies  Allergen Reactions  . Alendronate Sodium Other (See Comments)    Irritates esophagus and ulcers  . Buffered Aspirin Other (See Comments)    On blood thinners but doctor has started aspirin as stroke therapy  . Ibuprofen Other (See Comments)    Blood thinner therapy  . Prednisone Other (See Comments)    Patient states due to osteoporosis  . Vioxx (Rofecoxib) Other (See Comments)    Unknown by patient    Patient Measurements: Height: 5\' 2"  (157.5 cm) Weight: 159 lb 1.6 oz (72.167 kg) IBW/kg (Calculated) : 50.1   Vital Signs: Temp: 98.6 F (37 C) (11/22 0512) Temp src: Oral (11/22 0512) BP: 82/50 mmHg (11/22 0658) Pulse Rate: 81  (11/22 0658)  Labs:  Basename 10/31/12 0514 10/30/12 0523 10/29/12 0521  HGB -- -- 11.1*  HCT -- -- 32.3*  PLT -- -- 174  APTT -- -- --  LABPROT 21.7* 19.5* 16.4*  INR 1.98* 1.71* 1.35  HEPARINUNFRC -- -- --  CREATININE -- -- 0.83  CKTOTAL -- -- --  CKMB -- -- --  TROPONINI -- -- --    Estimated Creatinine Clearance: 51.9 ml/min (by C-G formula based on Cr of 0.83).   Medical History: Past Medical History  Diagnosis Date  . Migraines   . Lower back pain     disc   . Osteoporosis   . Hyperlipidemia   . Hypertension   . Urethral stenosis   . Aortic aneurysm   . Cardiac aneurysm     ventrral - bypass   . History of aorto-femoral bypass   . Pancreatitis   . Stricture esophagus     Hx  . GERD (gastroesophageal reflux disease)   . Carotid bruit     bil carotid bruits   . Peptic ulcer     Hx   . Gout   . H/O blood clots     Medications:  Prescriptions prior to admission  Medication Sig Dispense Refill  . amLODipine (NORVASC) 5 MG tablet Take 5 mg by mouth daily.        [name] atorvastatin (LIPITOR) 40 MG tablet Take 40 mg by mouth daily.        . Calcium Carbonate 1500 MG TABS Take 1 tablet by mouth every morning.       . colchicine 0.6 MG tablet Take 0.6-1.2 mg by mouth 2 (two) times daily as needed. For gout pain. Take two tablets by mouth at onset of pain, may take one additional tablet later if needed.      . fish oil-omega-3 fatty acids 1000 MG capsule Take 1 g by mouth every morning.       [name] HYDROcodone-acetaminophen (NORCO/VICODIN) 5-325 MG per tablet Take 1 tablet by mouth every 6 (six) hours as needed. For pain      . metoprolol (LOPRESSOR) 50 MG tablet Take 50 mg by mouth 2 (two) times daily.        [name] triamterene-hydrochlorothiazide (MAXZIDE-25) 37.5-25 MG per tablet Take 1 tablet by mouth daily.        [name] warfarin (COUMADIN) 4 MG tablet Take 4 mg by mouth as directed.         Assessment: No bleeding reported.  Being followed by GI.  Home dose noted as 4mg  daily.  INR below goal but is rising and approaching target range.  Pt c/o diarrhea which is slightly improved but non-bloody.  Sluggish  INR response despite not eating very much.  Patient on Cipro and Flagyl which can increase INR.  Goal of Therapy:  INR 2-3  Plan:  Warfarin 5mg  PO x 1. Daily PT/INR  Valrie Hart A 10/31/2012,8:54 AM

## 2012-10-31 NOTE — Progress Notes (Signed)
Subjective: Complains of headache. Nausea improved. Did have some nausea and vomiting after breakfast today. Abdominal pain improved.  Objective: Vital signs in last 24 hours: Filed Vitals:   10/31/12 0656 10/31/12 0657 10/31/12 0658 10/31/12 1521  BP: 149/68 142/60 82/50 174/66  Pulse: 76 75 81 78  Temp:    98.6 F (37 C)  TempSrc:    Oral  Resp:    18  Height:      Weight:      SpO2:    97%   Weight change: 0.136 kg (4.8 oz)  Intake/Output Summary (Last 24 hours) at 10/31/12 1632 Last data filed at 10/31/12 1200  Gross per 24 hour  Intake   1520 ml  Output      0 ml  Net   1520 ml   General: Alert. Comfortable lying supine. HEENT: moist mucous membranes Lungs clear to auscultation bilaterally without wheeze rhonchi or rales Abdomen soft right lower quadrant tender. nondistended Extremities no clubbing cyanosis or edema  Lab Results: Basic Metabolic Panel:  Lab 10/29/12 4098 10/28/12 0501 10/27/12 2012  NA 139 138 --  K 3.5 3.2* --  CL 111 109 --  CO2 20 19 --  GLUCOSE 119* 95 --  BUN 13 36* --  CREATININE 0.83 1.11* --  CALCIUM 7.4* 7.2* --  MG -- -- 1.7  PHOS -- -- --   Liver Function Tests:  Lab 10/27/12 2216 10/27/12 1705  AST 32 30  ALT 32 33  ALKPHOS 83 85  BILITOT 0.9 1.0  PROT 7.7 7.7  ALBUMIN 4.0 4.1    Lab 10/27/12 1705  LIPASE 27  AMYLASE --   No results found for this basename: AMMONIA:2 in the last 168 hours CBC:  Lab 10/29/12 0521 10/28/12 0501 10/27/12 1705  WBC 6.0 8.2 --  NEUTROABS 4.6 -- 17.4*  HGB 11.1* 12.0 --  HCT 32.3* 34.6* --  MCV 91.8 89.9 --  PLT 174 197 --   Cardiac Enzymes: No results found for this basename: CKTOTAL:3,CKMB:3,CKMBINDEX:3,TROPONINI:3 in the last 168 hours BNP: No results found for this basename: PROBNP:3 in the last 168 hours D-Dimer: No results found for this basename: DDIMER:2 in the last 168 hours CBG: No results found for this basename: GLUCAP:6 in the last 168 hours Hemoglobin A1C: No  results found for this basename: HGBA1C in the last 168 hours Fasting Lipid Panel: No results found for this basename: CHOL,HDL,LDLCALC,TRIG,CHOLHDL,LDLDIRECT in the last 119 hours Thyroid Function Tests:  Lab 10/27/12 2216  TSH 0.704  T4TOTAL --  FREET4 --  T3FREE --  THYROIDAB --   Coagulation:  Lab 10/31/12 0514 10/30/12 0523 10/29/12 0521 10/28/12 0501  LABPROT 21.7* 19.5* 16.4* 18.8*  INR 1.98* 1.71* 1.35 1.63*   Anemia Panel: No results found for this basename: VITAMINB12,FOLATE,FERRITIN,TIBC,IRON,RETICCTPCT in the last 168 hours Urine Drug Screen: Drugs of Abuse  No results found for this basename: labopia,  cocainscrnur,  labbenz,  amphetmu,  thcu,  labbarb    Alcohol Level: No results found for this basename: ETH:2 in the last 168 hours Urinalysis:  Lab 10/27/12 1754  COLORURINE YELLOW  LABSPEC 1.020  PHURINE 5.5  GLUCOSEU NEGATIVE  HGBUR SMALL*  BILIRUBINUR NEGATIVE  KETONESUR NEGATIVE  PROTEINUR NEGATIVE  UROBILINOGEN 0.2  NITRITE NEGATIVE  LEUKOCYTESUR NEGATIVE   Micro Results: Recent Results (from the past 240 hour(s))  CLOSTRIDIUM DIFFICILE BY PCR     Status: Normal   Collection Time   10/28/12 11:57 PM      Component  Value Range Status Comment   C difficile by pcr NEGATIVE  NEGATIVE Final   STOOL CULTURE     Status: Normal (Preliminary result)   Collection Time   10/28/12 11:58 PM      Component Value Range Status Comment   Specimen Description STOOL   Final    Special Requests NONE   Final    Culture     Final    Value: NO SUSPICIOUS COLONIES, CONTINUING TO HOLD     Note: REDUCED NORMAL FLORA PRESENT   Report Status PENDING   Incomplete    Studies/Results: No results found. Scheduled Meds:    . acidophilus  1 capsule Oral Daily  . atorvastatin  40 mg Oral q1800  . ondansetron (ZOFRAN) IV  4 mg Intravenous TID WC & HS  . pantoprazole  40 mg Oral QAC breakfast  . sodium chloride  3 mL Intravenous Q12H  . [EXPIRED] sodium chloride        . [COMPLETED] warfarin  5 mg Oral ONCE-1800  . warfarin  5 mg Oral ONCE-1800  . Warfarin - Pharmacist Dosing Inpatient   Does not apply q1800  . [DISCONTINUED] ciprofloxacin  400 mg Intravenous Q24H  . [DISCONTINUED] metoprolol  50 mg Oral BID  . [DISCONTINUED] metronidazole  500 mg Intravenous Q8H   Continuous Infusions:    . dextrose 5 % and 0.9 % NaCl with KCl 40 mEq/L 75 mL/hr at 10/30/12 1841   PRN Meds:.colchicine, HYDROmorphone (DILAUDID) injection, ondansetron (ZOFRAN) IV, traMADol, traZODone Assessment/Plan: Principal Problem:  *Enteritis Active Problems:  Chronic anticoagulation  Benign hypertension  Cerebrovascular disease  CAD (coronary artery disease)  PVD (peripheral vascular disease) GERD with stricture Orthostatic hypotension Headache  Dr. Darrick Penna has stopped patient's antibiotics. Stop metoprolol. Continue IV fluids. Stop Dilaudid. May have Tylenol as needed for headache.   LOS: 4 days   Earlie Schank L 10/31/2012, 4:32 PM

## 2012-10-31 NOTE — Progress Notes (Signed)
UR Chart Review Completed  

## 2012-10-31 NOTE — Progress Notes (Signed)
FEELING BETTER. ABX D/C. INITIAL SX LIKELY DUE TO VIRAL ENTERITIS FOLLOWED BY ? MEDICATION INTOLERANCE. ADD PROBIOTIC.  REVIEWED.

## 2012-10-31 NOTE — Evaluation (Signed)
Physical Therapy Evaluation Patient Details Name: KEYSHA DAMEWOOD MRN: [id number] DOB: 1932-10-08 Today's Date: 10/31/2012 Time: 4098-1191 PT Time Calculation (min): 30 min  PT Assessment / Plan / Recommendation Clinical Impression  Pt is found to be mildly deconditioned based on previous functional status.  She had been very independent and active in the community but feels weak due to limited oral intake.  She is highly motivated and should rebound to prior level with simply having the nursing staff have her ambulate intermittently while here in hospital.    PT Assessment  Patent does not need any further PT services    Follow Up Recommendations  No PT follow up    Does the patient have the potential to tolerate intense rehabilitation      Barriers to Discharge        Equipment Recommendations  None recommended by PT    Recommendations for Other Services     Frequency      Precautions / Restrictions Precautions Precautions: None Restrictions Weight Bearing Restrictions: No   Pertinent Vitals/Pain       Mobility  Transfers Transfers: Sit to Stand;Stand to Sit Sit to Stand: 6: Modified independent (Device/Increase time) Stand to Sit: 6: Modified independent (Device/Increase time) Details for Transfer Assistance: pt needed to hold onto arm of chair initially upon standing, but only for a few sec Ambulation/Gait Ambulation/Gait Assistance: 5: Supervision Ambulation Distance (Feet): 300 Feet Assistive device: None Ambulation/Gait Assistance Details: no LOB Gait Pattern: Within Functional Limits Gait velocity: WNL Stairs: No Wheelchair Mobility Wheelchair Mobility: No    Shoulder Instructions     Exercises     PT Diagnosis:    PT Problem List:   PT Treatment Interventions:     PT Goals    Visit Information  Last PT Received On: 10/31/12    Subjective Data  Subjective: I just can't hold any food down Patient Stated Goal: return home   Prior  Functioning  Home Living Lives With: Spouse Available Help at Discharge: Family;Available 24 hours/day Type of Home: House Home Access: Level entry Home Layout: One level Bathroom Toilet: Standard Home Adaptive Equipment: Walker - rolling Prior Function Level of Independence: Independent Able to Take Stairs?: Yes Driving: Yes Vocation: Retired Musician: No difficulties    Cognition  Overall Cognitive Status: Appears within functional limits for tasks assessed/performed Arousal/Alertness: Awake/alert Orientation Level: Appears intact for tasks assessed Behavior During Session: [facility] for tasks performed    Extremity/Trunk Assessment Right Lower Extremity Assessment RLE ROM/Strength/Tone: Within functional levels RLE Sensation: WFL - Light Touch RLE Coordination: WFL - gross motor Left Lower Extremity Assessment LLE ROM/Strength/Tone: Within functional levels LLE Sensation: WFL - Light Touch LLE Coordination: WFL - gross motor Trunk Assessment Trunk Assessment: Normal   Balance Balance Balance Assessed: No  End of Session PT - End of Session Equipment Utilized During Treatment: Gait belt Activity Tolerance: Patient tolerated treatment well Patient left: in chair;with call bell/phone within reach Nurse Communication: Mobility status  GP     Konrad Penta 10/31/2012, 11:21 AM

## 2012-10-31 NOTE — Progress Notes (Signed)
Called RE: no IV access, multiple attempts without success. Patient taking sufficient PO intake, still nauseated but improving, no vomiting not clinically dehydrated. Orders placed to not replace IV and meds changed to oral.

## 2012-11-01 DIAGNOSIS — R1110 Vomiting, unspecified: Secondary | ICD-10-CM | POA: Diagnosis not present

## 2012-11-01 DIAGNOSIS — I10 Essential (primary) hypertension: Secondary | ICD-10-CM | POA: Diagnosis not present

## 2012-11-01 DIAGNOSIS — R197 Diarrhea, unspecified: Secondary | ICD-10-CM | POA: Diagnosis not present

## 2012-11-01 DIAGNOSIS — K5289 Other specified noninfective gastroenteritis and colitis: Secondary | ICD-10-CM | POA: Diagnosis not present

## 2012-11-01 LAB — STOOL CULTURE

## 2012-11-01 LAB — PROTIME-INR
INR: 2.48 — ABNORMAL HIGH (ref 0.00–1.49)
Prothrombin Time: 25.7 seconds — ABNORMAL HIGH (ref 11.6–15.2)

## 2012-11-01 MED ORDER — ONDANSETRON 8 MG PO TBDP: 8.0000 mg | ORAL_TABLET | Freq: Three times a day (TID) | ORAL | Status: DC | PRN

## 2012-11-01 MED ORDER — RISAQUAD PO CAPS: 1.0000 | ORAL_CAPSULE | Freq: Every day | ORAL | Status: DC

## 2012-11-01 MED ORDER — WARFARIN SODIUM 2 MG PO TABS: 4.0000 mg | ORAL_TABLET | Freq: Once | ORAL | Status: DC

## 2012-11-01 NOTE — Progress Notes (Signed)
Pt and pt's husband provided with discharge instructions and prescriptions.  Pt and pt's spouse veralized understanding of d/c instructions using teach back.  Pt stable at time of d/c.

## 2012-11-01 NOTE — Discharge Summary (Signed)
Physician Discharge Summary  [name] JWJ:[id number] DOB: Jun 18, 1932 DOA: 10/27/2012  PCP: Rudi Heap, MD  Admit date: 10/27/2012 Discharge date: 11/01/2012  Time spent: Less than 30 minutes  Recommendations for Outpatient Follow-up:  1. Follow with primary care physician as needed.  Discharge Diagnoses:  1. Probable viral gastroenteritis. Improved. 2. Hypertension. 3. Coronary artery disease, stable. 4. Cerebrovascular disease, stable. 5. Chronic anticoagulation.   Discharge Condition: Stable and improved.  Diet recommendation: As tolerated, advancing gradually to a heart healthy diet.  Filed Weights   10/30/12 0440 10/31/12 0512 11/01/12 0541  Weight: 72.031 kg (158 lb 12.8 oz) 72.167 kg (159 lb 1.6 oz) 69.945 kg (154 lb 3.2 oz)    History of present illness:  This pleasant 78 year old lady presents to the hospital with symptoms vomiting and diarrhea for 2 days. Please see initial history as outlined below: [name] is an 78 y.o. female. [name] with a history of multiple vascular problems, including cardiac and aortic aneurysm status post repair, coronary artery disease, peripheral vascular disease, carotid artery disease status post endarterectomy. She is on chronic warfarin anticoagulation because of a history of vascular thrombosis. She reports that she been having steady abdominal pain for the past 2 days associated with vomiting and diarrhea, and headaches. She is reports of both vomitus and diarrhea consistent with watery yellow material with no blood. This she feels subjectively that she has been having fever and chills.  She denies chest pains or shortness of breath she denies lower extremity edema she denies exposure to sick contacts or recent antibiotic usage.  CT scan in the emergency room done without contrast shows evidence of thickening of the ileum suggestive of inflammatory or infectious enteritis.  Hospital Course:  The patient  was admitted and started on intravenous fluids and empirically initially on intravenous antibiotics. CT scan of her abdomen was somewhat abnormal, raising the possibility of ischemic colitis. She was seen by gastroenterology, Dr. Darrick Penna, and felt that the overall picture was one of viral gastroenteritis. As she improved, antibiotics actually were discontinued. She is now tolerated the diet appropriately without any significant nausea. She is very little in the way of diarrhea also now. She should be able to manage at home. She can followup with her primary care physician if required or if she has recurrent symptoms.  Procedures:  None.   Consultations:  Gastroenterology, Dr. Darrick Penna.  Discharge Exam: Filed Vitals:   10/31/12 0658 10/31/12 1521 10/31/12 2112 11/01/12 0541  BP: 82/50 174/66 139/51 163/63  Pulse: 81 78 80 72  Temp:  98.6 F (37 C) 98.7 F (37.1 C) 97.8 F (36.6 C)  TempSrc:  Oral Oral Oral  Resp:  18 18 18   Height:      Weight:    69.945 kg (154 lb 3.2 oz)  SpO2:  97% 96% 93%    General: She looks systemically well. She is not dehydrated. Cardiovascular: Heart sounds are present and normal without murmurs. Respiratory: Lung fields are clear. Abdomen soft and nontender now.  Discharge Instructions  Discharge Orders    Future Appointments: Provider: Department: Dept Phone: Center:   11/13/2012 1:00 PM [facility] - [phone] [facility]   11/13/2012 2:00 PM Evern Bio, NP [facility] [phone] [facility]     Future Orders Please Complete By Expires   Diet - low sodium heart healthy      Increase activity slowly  Medication List     As of 11/01/2012 11:48 AM    STOP taking these medications         triamterene-hydrochlorothiazide 37.5-25 MG per tablet   Commonly known as: MAXZIDE-25      TAKE these medications         acidophilus Caps   Take 1 capsule by mouth daily.       amLODipine 5 MG tablet   Commonly known as: NORVASC   Take 5 mg by mouth daily.      atorvastatin 40 MG tablet   Commonly known as: LIPITOR   Take 40 mg by mouth daily.      Calcium Carbonate 1500 MG Tabs   Take 1 tablet by mouth every morning.      colchicine 0.6 MG tablet   Take 0.6-1.2 mg by mouth 2 (two) times daily as needed. For gout pain. Take two tablets by mouth at onset of pain, may take one additional tablet later if needed.      fish oil-omega-3 fatty acids 1000 MG capsule   Take 1 g by mouth every morning.      HYDROcodone-acetaminophen 5-325 MG per tablet   Commonly known as: NORCO/VICODIN   Take 1 tablet by mouth every 6 (six) hours as needed. For pain      metoprolol 50 MG tablet   Commonly known as: LOPRESSOR   Take 50 mg by mouth 2 (two) times daily.      ondansetron 8 MG disintegrating tablet   Commonly known as: ZOFRAN-ODT   Take 1 tablet (8 mg total) by mouth every 8 (eight) hours as needed for nausea.      warfarin 4 MG tablet   Commonly known as: COUMADIN   Take 4 mg by mouth as directed.          The results of significant diagnostics from this hospitalization (including imaging, microbiology, ancillary and laboratory) are listed below for reference.    Significant Diagnostic Studies: Ct Abdomen Pelvis Wo Contrast  10/27/2012  *RADIOLOGY REPORT*  Clinical Data:  Left lower quadrant pain question diverticulitis; nausea, vomiting, diarrhea since 10/26/2012; past history appendectomy, cholecystectomy, hysterectomy, aortic surgery  CT ABDOMEN AND PELVIS WITHOUT CONTRAST  Technique:  Multidetector CT imaging of the abdomen and pelvis was performed following the standard protocol without intravenous contrast. Sagittal and coronal MPR images reconstructed from axial data set. Dilute oral contrast was administered for exam.  Comparison: CT abdomen/pelvis 05/10/2005, CT chest 01/15/2004  Findings: 5 mm right lower lobe nodule image 1, unchanged since  01/15/2004. Prior median sternotomy. Calcification adjacent to the cardiac apex unchanged, question prior pseudoaneurysm. Extensive atherosclerotic calcifications with evidence of prior aortobifemoral bypass. Within limits of a nonenhanced exam, no focal abnormalities of the spleen, pancreas, adrenal glands or right kidney. Scattered cortical scarring and question small cysts within left kidney.  Foci of gas are seen within the liver, question biliary origin, recommend correlation with history of sphincterotomy. Moderate sized hiatal hernia. Two ventral hernias are seen, both supraumbilical, 3.9 cm diameter fascial defect image 35 and 2.2 cm diameter defect image 46. Dilatation of proximal small bowel loops with segment of small bowel wall thickening particularly in the left mid abdomen. Distally small bowel loops are slightly smaller in caliber.  Minimal diverticulosis of the sigmoid colon. No bowel herniation identified. No mass, adenopathy, or free fluid. Bones appear diffusely demineralized. Old appearing compression deformity L2 unchanged since 2006.  IMPRESSION: Gas within the liver likely pneumobilia, recommend correlation for  history of sphincterotomy; no other features of portal venous gas or mesenteric gas are identified. Umbilical and ventral hernias as above. Segmental wall thickening of a jejunal loop in the left mid abdomen compatible with enteritis, question infection versus inflammatory bowel disease though ischemia not completely excluded. Stable right lower lobe nodule. Minimal sigmoid diverticulosis. Prior abdominal aortic bypass grafting.  Findings called to Dr. Judd Lien on 10/27/2012 at 1900 hours.   Original Report Authenticated By: Ulyses Southward, M.D.     Microbiology: Recent Results (from the past 240 hour(s))  CLOSTRIDIUM DIFFICILE BY PCR     Status: Normal   Collection Time   10/28/12 11:57 PM      Component Value Range Status Comment   C difficile by pcr NEGATIVE  NEGATIVE Final   STOOL  CULTURE     Status: Normal (Preliminary result)   Collection Time   10/28/12 11:58 PM      Component Value Range Status Comment   Specimen Description STOOL   Final    Special Requests NONE   Final    Culture     Final    Value: NO SUSPICIOUS COLONIES, CONTINUING TO HOLD     Note: REDUCED NORMAL FLORA PRESENT   Report Status PENDING   Incomplete      Labs: Basic Metabolic Panel:  Lab 10/29/12 1610 10/28/12 0501 10/27/12 2012 10/27/12 1705  NA 139 138 -- 138  K 3.5 3.2* -- 3.2*  CL 111 109 -- 100  CO2 20 19 -- 21  GLUCOSE 119* 95 -- 175*  BUN 13 36* -- 50*  CREATININE 0.83 1.11* -- 1.49*  CALCIUM 7.4* 7.2* -- 9.6  MG -- -- 1.7 --  PHOS -- -- -- --   Liver Function Tests:  Lab 10/27/12 2216 10/27/12 1705  AST 32 30  ALT 32 33  ALKPHOS 83 85  BILITOT 0.9 1.0  PROT 7.7 7.7  ALBUMIN 4.0 4.1    Lab 10/27/12 1705  LIPASE 27  AMYLASE --    CBC:  Lab 10/29/12 0521 10/28/12 0501 10/27/12 1705  WBC 6.0 8.2 18.9*  NEUTROABS 4.6 -- 17.4*  HGB 11.1* 12.0 16.1*  HCT 32.3* 34.6* 45.8  MCV 91.8 89.9 89.5  PLT 174 197 254         Signed:  GOSRANI,NIMISH C  [facility] 11/01/2012, 11:48 AM

## 2012-11-01 NOTE — Progress Notes (Signed)
ANTICOAGULATION CONSULT NOTE  Pharmacy Consult for Warfarin Indication: VTE, Hx vascular thrombosis.  Allergies  Allergen Reactions  . Alendronate Sodium Other (See Comments)    Irritates esophagus and ulcers  . Buffered Aspirin Other (See Comments)    On blood thinners but doctor has started aspirin as stroke therapy  . Ibuprofen Other (See Comments)    Blood thinner therapy  . Prednisone Other (See Comments)    Patient states due to osteoporosis  . Vioxx (Rofecoxib) Other (See Comments)    Unknown by patient    Patient Measurements: Height: 5\' 2"  (157.5 cm) Weight: 154 lb 3.2 oz (69.945 kg) IBW/kg (Calculated) : 50.1   Vital Signs: Temp: 97.8 F (36.6 C) (11/23 0541) Temp src: Oral (11/23 0541) BP: 163/63 mmHg (11/23 0541) Pulse Rate: 72  (11/23 0541)  Labs:  [name] 11/01/12 0511 10/31/12 0514 10/30/12 0523  HGB -- -- --  HCT -- -- --  PLT -- -- --  APTT -- -- --  LABPROT 25.7* 21.7* 19.5*  INR 2.48* 1.98* 1.71*  HEPARINUNFRC -- -- --  CREATININE -- -- --  CKTOTAL -- -- --  CKMB -- -- --  TROPONINI -- -- --    Estimated Creatinine Clearance: 51.1 ml/min (by C-G formula based on Cr of 0.83).   Medical History: Past Medical History  Diagnosis Date  . Migraines   . Lower back pain     disc   . Osteoporosis   . Hyperlipidemia   . Hypertension   . Urethral stenosis   . Aortic aneurysm   . Cardiac aneurysm     ventrral - bypass   . History of aorto-femoral bypass   . Pancreatitis   . Stricture esophagus     Hx  . GERD (gastroesophageal reflux disease)   . Carotid bruit     bil carotid bruits   . Peptic ulcer     Hx   . Gout   . H/O blood clots     Medications:  Prescriptions prior to admission  Medication Sig Dispense Refill  . amLODipine (NORVASC) 5 MG tablet Take 5 mg by mouth daily.        [name] atorvastatin (LIPITOR) 40 MG tablet Take 40 mg by mouth daily.        . Calcium Carbonate 1500 MG TABS Take 1 tablet by mouth every morning.        . colchicine 0.6 MG tablet Take 0.6-1.2 mg by mouth 2 (two) times daily as needed. For gout pain. Take two tablets by mouth at onset of pain, may take one additional tablet later if needed.      . fish oil-omega-3 fatty acids 1000 MG capsule Take 1 g by mouth every morning.       [name] HYDROcodone-acetaminophen (NORCO/VICODIN) 5-325 MG per tablet Take 1 tablet by mouth every 6 (six) hours as needed. For pain      . metoprolol (LOPRESSOR) 50 MG tablet Take 50 mg by mouth 2 (two) times daily.        [name] triamterene-hydrochlorothiazide (MAXZIDE-25) 37.5-25 MG per tablet Take 1 tablet by mouth daily.        [name] warfarin (COUMADIN) 4 MG tablet Take 4 mg by mouth as directed.         Assessment: No bleeding reported.  Being followed by GI.  Home dose noted as 4mg  daily.  INR now at goal.  Goal of Therapy:  INR 2-3  Plan:  Warfarin 4mg  PO x 1. Daily PT/INR  [name] R 11/01/2012,9:51 AM

## 2012-11-11 DIAGNOSIS — I714 Abdominal aortic aneurysm, without rupture: Secondary | ICD-10-CM | POA: Diagnosis not present

## 2012-11-12 ENCOUNTER — Encounter: Payer: Self-pay | Admitting: Neurosurgery

## 2012-11-13 ENCOUNTER — Other Ambulatory Visit (INDEPENDENT_AMBULATORY_CARE_PROVIDER_SITE_OTHER): Payer: Medicare Other | Admitting: *Deleted

## 2012-11-13 ENCOUNTER — Encounter: Payer: Self-pay | Admitting: Neurosurgery

## 2012-11-13 ENCOUNTER — Encounter (HOSPITAL_COMMUNITY): Payer: Self-pay | Admitting: Pharmacy Technician

## 2012-11-13 ENCOUNTER — Other Ambulatory Visit: Payer: Self-pay | Admitting: *Deleted

## 2012-11-13 ENCOUNTER — Ambulatory Visit (INDEPENDENT_AMBULATORY_CARE_PROVIDER_SITE_OTHER): Payer: Medicare Other | Admitting: Neurosurgery

## 2012-11-13 ENCOUNTER — Encounter: Payer: Self-pay | Admitting: *Deleted

## 2012-11-13 VITALS — BP 129/70 | HR 88 | Resp 16 | Ht 60.0 in | Wt 147.0 lb

## 2012-11-13 DIAGNOSIS — Z48812 Encounter for surgical aftercare following surgery on the circulatory system: Secondary | ICD-10-CM | POA: Diagnosis not present

## 2012-11-13 DIAGNOSIS — I6529 Occlusion and stenosis of unspecified carotid artery: Secondary | ICD-10-CM

## 2012-11-13 NOTE — Progress Notes (Signed)
[facility] [place] Carotid Office Note  CC: Carotid surveillance Referring Physician: Edilia Bo  History of Present Illness: 78 year old female patient who was seen one time by Dr. Edilia Bo for a temporal artery biopsy. The patient has a history with Dr. Madilyn Fireman. The patient denies any signs or symptoms of CVA, TIA, amaurosis fugax or any neural deficit. The patient denies any new medical diagnoses or recent surgery.  Past Medical History  Diagnosis Date  . Migraines   . Lower back pain     disc   . Osteoporosis   . Hyperlipidemia   . Hypertension   . Urethral stenosis   . Aortic aneurysm   . Cardiac aneurysm     ventrral - bypass   . History of aorto-femoral bypass   . Pancreatitis   . Stricture esophagus     Hx  . GERD (gastroesophageal reflux disease)   . Carotid bruit     bil carotid bruits   . Peptic ulcer     Hx   . Gout   . H/O blood clots     ROS: [x]  Positive   [ ]  Denies    General: [ ]  Weight loss, [ ]  Fever, [ ]  chills Neurologic: [ ]  Dizziness, [ ]  Blackouts, [ ]  Seizure [ ]  Stroke, [ ]  "Mini stroke", [ ]  Slurred speech, [ ]  Temporary blindness; [ ]  weakness in arms or legs, [ ]  Hoarseness Cardiac: [ ]  Chest pain/pressure, [ ]  Shortness of breath at rest [ ]  Shortness of breath with exertion, [ ]  Atrial fibrillation or irregular heartbeat Vascular: [ ]  Pain in legs with walking, [ ]  Pain in legs at rest, [ ]  Pain in legs at night,  [ ]  Non-healing ulcer, [ ]  Blood clot in vein/DVT,   Pulmonary: [ ]  Home oxygen, [ ]  Productive cough, [ ]  Coughing up blood, [ ]  Asthma,  [ ]  Wheezing Musculoskeletal:  [ ]  Arthritis, [ ]  Low back pain, [ ]  Joint pain Hematologic: [ ]  Easy Bruising, [ ]  Anemia; [ ]  Hepatitis Gastrointestinal: [ ]  Blood in stool, [ ]  Gastroesophageal Reflux/heartburn, [ ]  Trouble swallowing Urinary: [ ]  chronic Kidney disease, [ ]  on HD - [ ]  MWF or [ ]  TTHS, [ ]  Burning with urination, [ ]  Difficulty urinating Skin: [ ]  Rashes,  [ ]  Wounds Psychological: [ ]  Anxiety, [ ]  Depression   Social History History  Substance Use Topics  . Smoking status: Former Smoker    Quit date: 04/06/1998  . Smokeless tobacco: Never Used  . Alcohol Use: No    Family History Family History  Problem Relation Age of Onset  . CAD Father     died age 39+  . Osteoarthritis Sister     paraplegic due to disk rupture  . Colon cancer Neg Hx     Allergies  Allergen Reactions  . Alendronate Sodium Other (See Comments)    Irritates esophagus and ulcers  . Buffered Aspirin Other (See Comments)    On blood thinners but doctor has started aspirin as stroke therapy  . Ibuprofen Other (See Comments)    Blood thinner therapy  . Prednisone Other (See Comments)    Patient states due to osteoporosis  . Vioxx (Rofecoxib) Other (See Comments)    Unknown by patient    Current Outpatient Prescriptions  Medication Sig Dispense Refill  . acidophilus (RISAQUAD) CAPS Take 1 capsule by mouth daily.  30 capsule  0  . amLODipine (NORVASC) 5 MG tablet  Take 5 mg by mouth daily.        [name] atorvastatin (LIPITOR) 40 MG tablet Take 40 mg by mouth daily.        . Calcium Carbonate 1500 MG TABS Take 1 tablet by mouth every morning.      . colchicine 0.6 MG tablet Take 0.6-1.2 mg by mouth 2 (two) times daily as needed. For gout pain. Take two tablets by mouth at onset of pain, may take one additional tablet later if needed.      . fish oil-omega-3 fatty acids 1000 MG capsule Take 1 g by mouth every morning.       [name] HYDROcodone-acetaminophen (NORCO/VICODIN) 5-325 MG per tablet Take 1 tablet by mouth every 6 (six) hours as needed. For pain      . metoprolol (LOPRESSOR) 50 MG tablet Take 50 mg by mouth 2 (two) times daily.        . ondansetron (ZOFRAN-ODT) 8 MG disintegrating tablet Take 1 tablet (8 mg total) by mouth every 8 (eight) hours as needed for nausea.  20 tablet  0  . warfarin (COUMADIN) 4 MG tablet Take 4 mg by mouth as directed.          Physical Examination  Filed Vitals:   11/13/12 1357  BP: 129/70  Pulse: 88  Resp:     Body mass index is 28.71 kg/(m^2).  General:  WDWN in NAD Gait: Normal HEENT: WNL Eyes: Pupils equal Pulmonary: normal non-labored breathing , without Rales, rhonchi,  wheezing Cardiac: RRR, without  Murmurs, rubs or gallops; Abdomen: soft, NT, no masses Skin: no rashes, ulcers noted  Vascular Exam Pulses: 3+ radial pulses bilaterally, there are palpable femoral pulses bilaterally Carotid bruits: Mild carotid bruits heard bilaterally Extremities without ischemic changes, no Gangrene , no cellulitis; no open wounds;  Musculoskeletal: no muscle wasting or atrophy   Neurologic: A&O X 3; Appropriate Affect ; SENSATION: normal; MOTOR FUNCTION:  moving all extremities equally. 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 This was reviewed with Dr. Darrick Penna  ASSESSMENT/PLAN: Asymptomatic patient that will require a arch and carotid arteriogram which will be scheduled for Monday the eighth with Dr. Edilia Bo. This will be performed to evaluate the elevated velocities that are seen on duplex. Otherwise the patient will have repeat carotid duplex in one year. The patient's questions were encouraged and answered she is in agreement with this plan. Followup here be pending the arteriogram.  Lauree Chandler ANP   Clinic MD: Darrick Penna

## 2012-11-17 ENCOUNTER — Encounter (HOSPITAL_COMMUNITY)
Admission: RE | Disposition: A | Discharge status: Home / Self Care | Payer: Self-pay | Source: Ambulatory Visit | Attending: Vascular Surgery

## 2012-11-17 ENCOUNTER — Other Ambulatory Visit: Payer: Self-pay | Admitting: *Deleted

## 2012-11-17 ENCOUNTER — Ambulatory Visit (HOSPITAL_COMMUNITY)
Admission: RE | Admit: 2012-11-17 | Discharge: 2012-11-17 | Disposition: A | Discharge status: Home / Self Care | Payer: Medicare Other | Source: Ambulatory Visit | Attending: Vascular Surgery | Admitting: Vascular Surgery

## 2012-11-17 DIAGNOSIS — I10 Essential (primary) hypertension: Secondary | ICD-10-CM | POA: Diagnosis not present

## 2012-11-17 DIAGNOSIS — E785 Hyperlipidemia, unspecified: Secondary | ICD-10-CM | POA: Insufficient documentation

## 2012-11-17 DIAGNOSIS — I708 Atherosclerosis of other arteries: Secondary | ICD-10-CM | POA: Diagnosis not present

## 2012-11-17 DIAGNOSIS — I6529 Occlusion and stenosis of unspecified carotid artery: Secondary | ICD-10-CM | POA: Diagnosis not present

## 2012-11-17 DIAGNOSIS — K219 Gastro-esophageal reflux disease without esophagitis: Secondary | ICD-10-CM | POA: Insufficient documentation

## 2012-11-17 HISTORY — PX: ARCH AORTOGRAM: SHX5501

## 2012-11-17 LAB — POCT I-STAT, CHEM 8
BUN: 21 mg/dL (ref 6–23)
Calcium, Ion: 1.22 mmol/L (ref 1.13–1.30)
Chloride: 106 mEq/L (ref 96–112)
Creatinine, Ser: 1.2 mg/dL — ABNORMAL HIGH (ref 0.50–1.10)
Glucose, Bld: 111 mg/dL — ABNORMAL HIGH (ref 70–99)
HCT: 39 % (ref 36.0–46.0)
Hemoglobin: 13.3 g/dL (ref 12.0–15.0)
Potassium: 3.5 mEq/L (ref 3.5–5.1)
Sodium: 141 mEq/L (ref 135–145)
TCO2: 21 mmol/L (ref 0–100)

## 2012-11-17 LAB — PROTIME-INR
INR: 0.99 (ref 0.00–1.49)
Prothrombin Time: 13 seconds (ref 11.6–15.2)

## 2012-11-17 SURGERY — ARCH AORTOGRAM
Anesthesia: LOCAL

## 2012-11-17 MED ORDER — ACETAMINOPHEN 325 MG PO TABS
650.0000 mg | ORAL_TABLET | Freq: Once | ORAL | Status: AC
  Administered 2012-11-17: 650 mg via ORAL
  Filled 2012-11-17 (×2): qty 2

## 2012-11-17 MED ORDER — FENTANYL CITRATE 0.05 MG/ML IJ SOLN
INTRAMUSCULAR | Status: AC
  Filled 2012-11-17: qty 2

## 2012-11-17 MED ORDER — SODIUM CHLORIDE 0.9 % IV SOLN
INTRAVENOUS | Status: DC
  Administered 2012-11-17: 08:00:00 via INTRAVENOUS

## 2012-11-17 MED ORDER — ONDANSETRON HCL 4 MG/2ML IJ SOLN: 4.0000 mg | Freq: Four times a day (QID) | INTRAMUSCULAR | Status: DC | PRN

## 2012-11-17 MED ORDER — SODIUM CHLORIDE 0.9 % IV SOLN: 1.0000 mL/kg/h | INTRAVENOUS | Status: DC

## 2012-11-17 MED ORDER — ACETAMINOPHEN 325 MG PO TABS: 650.0000 mg | ORAL_TABLET | ORAL | Status: DC | PRN

## 2012-11-17 MED ORDER — LABETALOL HCL 5 MG/ML IV SOLN
INTRAVENOUS | Status: AC
  Filled 2012-11-17: qty 4

## 2012-11-17 MED ORDER — OXYCODONE-ACETAMINOPHEN 5-325 MG PO TABS: 1.0000 | ORAL_TABLET | ORAL | Status: DC | PRN

## 2012-11-17 NOTE — Op Note (Signed)
PATIENT: [name]   MRN: [id number] DOB: 29-Jul-1932    DATE OF PROCEDURE: 11/17/2012  INDICATIONS: [name] is a 78 y.o. female who had a routine carotid duplex study in our office which showed no significant carotid disease but there was increased velocities noted in the innominate artery. 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  PROCEDURE:  1. Ultrasound-guided access to the right common femoral artery 2. Arch aortogram  SURGEON: Di Kindle. Edilia Bo, MD, FACS  ANESTHESIA: local   EBL: minimal  TECHNIQUE: The patient was brought to the peripheral vascular labs. Both groins were prepped and draped in the usual sterile fashion. After the skin was anesthetized with 1% lidocaine, and under ultrasound guidance, the right limb of her aortofemoral graft was cannulated and a guidewire was introduced but would not pass easily. For this reason the needle and wire were removed and pressure held for hemostasis. I then cannulated the graft again under ultrasound down this and the wire went easily. A 5 French sheath was introduced over the wire. A long pigtail catheter was positioned in the ascending aortic arch. Arch aortogram was obtained at a 40 LAO projection and then at a proximally 10 LAO projection.  FINDINGS:  1. The arch is widely patent. 2. 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 3. The right subclavian, right common carotid artery, left common carotid artery and left carotid to subclavian bypass graft are patent. 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  Waverly Ferrari, MD, FACS [facility]  DATE OF DICTATION:   11/17/2012

## 2012-11-17 NOTE — H&P (View-Only) (Signed)
[facility] Carotid Office Note  CC: Carotid surveillance Referring Physician: Dickson  History of Present Illness: 78-year-old female patient who was seen one time by Dr. Dickson for a temporal artery biopsy. The patient has a history with Dr. Hayes. The patient denies any signs or symptoms of CVA, TIA, amaurosis fugax or any neural deficit. The patient denies any new medical diagnoses or recent surgery.  Past Medical History  Diagnosis Date  . Migraines   . Lower back pain     disc   . Osteoporosis   . Hyperlipidemia   . Hypertension   . Urethral stenosis   . Aortic aneurysm   . Cardiac aneurysm     ventrral - bypass   . History of aorto-femoral bypass   . Pancreatitis   . Stricture esophagus     Hx  . GERD (gastroesophageal reflux disease)   . Carotid bruit     bil carotid bruits   . Peptic ulcer     Hx   . Gout   . H/O blood clots     ROS: [x] Positive   [ ] Denies    General: [ ] Weight loss, [ ] Fever, [ ] chills Neurologic: [ ] Dizziness, [ ] Blackouts, [ ] Seizure [ ] Stroke, [ ] "Mini stroke", [ ] Slurred speech, [ ] Temporary blindness; [ ] weakness in arms or legs, [ ] Hoarseness Cardiac: [ ] Chest pain/pressure, [ ] Shortness of breath at rest [ ] Shortness of breath with exertion, [ ] Atrial fibrillation or irregular heartbeat Vascular: [ ] Pain in legs with walking, [ ] Pain in legs at rest, [ ] Pain in legs at night,  [ ] Non-healing ulcer, [ ] Blood clot in vein/DVT,   Pulmonary: [ ] Home oxygen, [ ] Productive cough, [ ] Coughing up blood, [ ] Asthma,  [ ] Wheezing Musculoskeletal:  [ ] Arthritis, [ ] Low back pain, [ ] Joint pain Hematologic: [ ] Easy Bruising, [ ] Anemia; [ ] Hepatitis Gastrointestinal: [ ] Blood in stool, [ ] Gastroesophageal Reflux/heartburn, [ ] Trouble swallowing Urinary: [ ] chronic Kidney disease, [ ] on HD - [ ] MWF or [ ] TTHS, [ ] Burning with urination, [ ] Difficulty urinating Skin: [ ] Rashes,  [ ] Wounds Psychological: [ ] Anxiety, [ ] Depression   Social History History  Substance Use Topics  . Smoking status: Former Smoker    Quit date: 04/06/1998  . Smokeless tobacco: Never Used  . Alcohol Use: No    Family History Family History  Problem Relation Age of Onset  . CAD Father     died age 80+  . Osteoarthritis Sister     paraplegic due to disk rupture  . Colon cancer Neg Hx     Allergies  Allergen Reactions  . Alendronate Sodium Other (See Comments)    Irritates esophagus and ulcers  . Buffered Aspirin Other (See Comments)    On blood thinners but doctor has started aspirin as stroke therapy  . Ibuprofen Other (See Comments)    Blood thinner therapy  . Prednisone Other (See Comments)    Patient states due to osteoporosis  . Vioxx (Rofecoxib) Other (See Comments)    Unknown by patient    Current Outpatient Prescriptions  Medication Sig Dispense Refill  . acidophilus (RISAQUAD) CAPS Take 1 capsule by mouth daily.  30 capsule  0  . amLODipine (NORVASC) 5 MG tablet   Take 5 mg by mouth daily.        . atorvastatin (LIPITOR) 40 MG tablet Take 40 mg by mouth daily.        . Calcium Carbonate 1500 MG TABS Take 1 tablet by mouth every morning.      . colchicine 0.6 MG tablet Take 0.6-1.2 mg by mouth 2 (two) times daily as needed. For gout pain. Take two tablets by mouth at onset of pain, may take one additional tablet later if needed.      . fish oil-omega-3 fatty acids 1000 MG capsule Take 1 g by mouth every morning.       . HYDROcodone-acetaminophen (NORCO/VICODIN) 5-325 MG per tablet Take 1 tablet by mouth every 6 (six) hours as needed. For pain      . metoprolol (LOPRESSOR) 50 MG tablet Take 50 mg by mouth 2 (two) times daily.        . ondansetron (ZOFRAN-ODT) 8 MG disintegrating tablet Take 1 tablet (8 mg total) by mouth every 8 (eight) hours as needed for nausea.  20 tablet  0  . warfarin (COUMADIN) 4 MG tablet Take 4 mg by mouth as directed.          Physical Examination  Filed Vitals:   11/13/12 1357  BP: 129/70  Pulse: 88  Resp:     Body mass index is 28.71 kg/(m^2).  General:  WDWN in NAD Gait: Normal HEENT: WNL Eyes: Pupils equal Pulmonary: normal non-labored breathing , without Rales, rhonchi,  wheezing Cardiac: RRR, without  Murmurs, rubs or gallops; Abdomen: soft, NT, no masses Skin: no rashes, ulcers noted  Vascular Exam Pulses: 3+ radial pulses bilaterally, there are palpable femoral pulses bilaterally Carotid bruits: Mild carotid bruits heard bilaterally Extremities without ischemic changes, no Gangrene , no cellulitis; no open wounds;  Musculoskeletal: no muscle wasting or atrophy   Neurologic: A&O X 3; Appropriate Affect ; SENSATION: normal; MOTOR FUNCTION:  moving all extremities equally. 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 This was reviewed with Dr. Fields  ASSESSMENT/PLAN: Asymptomatic patient that will require a arch and carotid arteriogram which will be scheduled for Monday the eighth with Dr. Dickson. This will be performed to evaluate the elevated velocities that are seen on duplex. Otherwise the patient will have repeat carotid duplex in one year. The patient's questions were encouraged and answered she is in agreement with this plan. Followup here be pending the arteriogram.  Atiba Kimberlin ANP   Clinic MD: Fields 

## 2012-11-17 NOTE — Interval H&P Note (Signed)
History and Physical Interval Note:  11/17/2012 8:44 AM  Comer Locket  has presented today for surgery, with the diagnosis of Stenosis  The various methods of treatment have been discussed with the patient and family. After consideration of risks, benefits and other options for treatment, the patient has consented to  Procedure(s) (LRB) with comments: ARCH AORTOGRAM (N/A) CAROTID ANGIOGRAM (N/A) as a surgical intervention .  The patient's history has been reviewed, patient examined, no change in status, stable for surgery.  I have reviewed the patient's chart and labs.  Questions were answered to the patient's satisfaction.     DICKSON,CHRISTOPHER S

## 2012-12-23 DIAGNOSIS — I714 Abdominal aortic aneurysm, without rupture: Secondary | ICD-10-CM | POA: Diagnosis not present

## 2013-02-25 ENCOUNTER — Other Ambulatory Visit: Payer: Self-pay | Admitting: *Deleted

## 2013-02-25 DIAGNOSIS — E785 Hyperlipidemia, unspecified: Secondary | ICD-10-CM

## 2013-02-25 MED ORDER — ATORVASTATIN CALCIUM 40 MG PO TABS: 40.0000 mg | ORAL_TABLET | Freq: Every day | ORAL | Status: DC

## 2013-03-18 ENCOUNTER — Other Ambulatory Visit: Payer: Self-pay | Admitting: Nurse Practitioner

## 2013-03-24 ENCOUNTER — Ambulatory Visit (INDEPENDENT_AMBULATORY_CARE_PROVIDER_SITE_OTHER): Payer: Medicare Other | Admitting: Pharmacist Clinician (PhC)/ Clinical Pharmacy Specialist

## 2013-03-24 DIAGNOSIS — R197 Diarrhea, unspecified: Secondary | ICD-10-CM

## 2013-03-24 DIAGNOSIS — I714 Abdominal aortic aneurysm, without rupture: Secondary | ICD-10-CM

## 2013-04-14 NOTE — Progress Notes (Signed)
Per Chari Manning - C.diff was ordered in error

## 2013-04-16 ENCOUNTER — Other Ambulatory Visit: Payer: Self-pay | Admitting: Nurse Practitioner

## 2013-04-21 ENCOUNTER — Other Ambulatory Visit: Payer: Self-pay | Admitting: Nurse Practitioner

## 2013-05-12 ENCOUNTER — Ambulatory Visit (INDEPENDENT_AMBULATORY_CARE_PROVIDER_SITE_OTHER): Payer: Medicare Other | Admitting: Pharmacist Clinician (PhC)/ Clinical Pharmacy Specialist

## 2013-05-12 DIAGNOSIS — I714 Abdominal aortic aneurysm, without rupture, unspecified: Secondary | ICD-10-CM

## 2013-05-12 DIAGNOSIS — I6529 Occlusion and stenosis of unspecified carotid artery: Secondary | ICD-10-CM | POA: Diagnosis not present

## 2013-05-12 LAB — POCT INR: INR: 3.4

## 2013-05-22 ENCOUNTER — Other Ambulatory Visit: Payer: Self-pay | Admitting: Nurse Practitioner

## 2013-05-25 NOTE — Telephone Encounter (Signed)
Last labs 05/13

## 2013-06-04 ENCOUNTER — Ambulatory Visit (INDEPENDENT_AMBULATORY_CARE_PROVIDER_SITE_OTHER): Payer: Medicare Other | Admitting: Nurse Practitioner

## 2013-06-04 ENCOUNTER — Encounter: Payer: Self-pay | Admitting: Nurse Practitioner

## 2013-06-04 VITALS — BP 150/63 | HR 62 | Temp 97.7°F | Wt 148.0 lb

## 2013-06-04 DIAGNOSIS — R197 Diarrhea, unspecified: Secondary | ICD-10-CM

## 2013-06-04 LAB — POCT CBC
MCHC: 36.8 g/dL — AB (ref 31.8–35.4)
MPV: 7.7 fL (ref 0–99.8)
POC Granulocyte: 7.4 — AB (ref 2–6.9)
POC LYMPH PERCENT: 14.7 %L (ref 10–50)
RDW, POC: 13.6 %
WBC: 9.2 10*3/uL (ref 4.6–10.2)

## 2013-06-04 MED ORDER — DIPHENOXYLATE-ATROPINE 2.5-0.025 MG PO TABS: 1.0000 | ORAL_TABLET | Freq: Four times a day (QID) | ORAL | Status: DC | PRN

## 2013-06-04 MED ORDER — HYDROCODONE-ACETAMINOPHEN 5-325 MG PO TABS: 1.0000 | ORAL_TABLET | Freq: Four times a day (QID) | ORAL | Status: DC | PRN

## 2013-06-04 NOTE — Patient Instructions (Addendum)
Diarrhea Diarrhea is frequent loose and watery bowel movements. It can cause you to feel weak and dehydrated. Dehydration can cause you to become tired and thirsty, have a dry mouth, and have decreased urination that often is dark yellow. Diarrhea is a sign of another problem, most often an infection that will not last long. In most cases, diarrhea typically lasts 2 3 days. However, it can last longer if it is a sign of something more serious. It is important to treat your diarrhea as directed by your caregive to lessen or prevent future episodes of diarrhea. CAUSES  Some common causes include:  Gastrointestinal infections caused by viruses, bacteria, or parasites.  Food poisoning or food allergies.  Certain medicines, such as antibiotics, chemotherapy, and laxatives.  Artificial sweeteners and fructose.  Digestive disorders. HOME CARE INSTRUCTIONS  Ensure adequate fluid intake (hydration): have 1 cup (8 oz) of fluid for each diarrhea episode. Avoid fluids that contain simple sugars or sports drinks, fruit juices, whole milk products, and sodas. Your urine should be clear or pale yellow if you are drinking enough fluids. Hydrate with an oral rehydration solution that you can purchase at pharmacies, retail stores, and online. You can prepare an oral rehydration solution at home by mixing the following ingredients together:    tsp table salt.   tsp baking soda.   tsp salt substitute containing potassium chloride.  1  tablespoons sugar.  1 L (34 oz) of water.  Certain foods and beverages may increase the speed at which food moves through the gastrointestinal (GI) tract. These foods and beverages should be avoided and include:  Caffeinated and alcoholic beverages.  High-fiber foods, such as raw fruits and vegetables, nuts, seeds, and whole grain breads and cereals.  Foods and beverages sweetened with sugar alcohols, such as xylitol, sorbitol, and mannitol.  Some foods may be well  tolerated and may help thicken stool including:  Starchy foods, such as rice, toast, pasta, low-sugar cereal, oatmeal, grits, baked potatoes, crackers, and bagels.  Bananas.  Applesauce.  Add probiotic-rich foods to help increase healthy bacteria in the GI tract, such as yogurt and fermented milk products.  Wash your hands well after each diarrhea episode.  Only take over-the-counter or prescription medicines as directed by your caregiver.  Take a warm bath to relieve any burning or pain from frequent diarrhea episodes. SEEK IMMEDIATE MEDICAL CARE IF:   You are unable to keep fluids down.  You have persistent vomiting.  You have blood in your stool, or your stools are black and tarry.  You do not urinate in 6 8 hours, or there is only a small amount of very dark urine.  You have abdominal pain that increases or localizes.  You have weakness, dizziness, confusion, or lightheadedness.  You have a severe headache.  Your diarrhea gets worse or does not get better.  You have a fever or persistent symptoms for more than 2 3 days.  You have a fever and your symptoms suddenly get worse. MAKE SURE YOU:   Understand these instructions.  Will watch your condition.  Will get help right away if you are not doing well or get worse. Document Released: 11/16/2002 Document Revised: 11/12/2012 Document Reviewed: 08/03/2012 [employer] Patient Information 2014 [employer].  

## 2013-06-04 NOTE — Progress Notes (Signed)
Pt. Aware.

## 2013-06-04 NOTE — Progress Notes (Addendum)
  Subjective:    Patient ID: [name], female    DOB: [birth year], 79 y.o.   MRN: [id number]  Diarrhea  This is a recurrent problem. Episode onset: Pt states the week of Thanksgiving she was admitted to the hospital with N&V and diarrhea. Since then the diarrhea has not resolved. The problem occurs 5 to 10 times per day. The problem has been unchanged. The stool consistency is described as watery. The patient states that diarrhea awakens her from sleep. Associated symptoms include abdominal pain and weight loss. Pertinent negatives include no bloating, chills, fever or increased  flatus. Nothing aggravates the symptoms. Risk factors include recent hospitalization. She has tried bismuth subsalicylate for the symptoms. The treatment provided no relief.      Review of Systems  Constitutional: Positive for weight loss. Negative for fever and chills.  Gastrointestinal: Positive for abdominal pain and diarrhea. Negative for bloating and flatus.  All other systems reviewed and are negative.       Objective:   Physical Exam  Constitutional: She is oriented to person, place, and time. She appears well-developed and well-nourished.  HENT:  Head: Normocephalic.  Cardiovascular: Normal rate and regular rhythm.   Murmur heard. Pulmonary/Chest: Effort normal and breath sounds normal.  Abdominal: Soft. There is tenderness. There is guarding.  Hyperactive BS   Musculoskeletal: Normal range of motion.  Neurological: She is alert and oriented to person, place, and time.  Skin: Skin is warm and dry.  Psychiatric: She has a normal mood and affect. Her behavior is normal. Judgment normal.   BP 150/63  Pulse 62  Temp(Src) 97.7 F (36.5 C) (Oral)  Wt 148 lb (67.132 kg)  BMI 27.06 kg/m2        Assessment & Plan:  1. Diarrhea Containers for stool specimens given to patient Force fluids - POCT CBC - diphenoxylate-atropine (LOMOTIL) 2.5-0.025 MG per tablet; Take 1 tablet by mouth 4 (four)  times daily as needed for diarrhea or loose stools.  Dispense: 20 tablet; Refill: 0  Mary-Treena Daphine Deutscher, FNP

## 2013-06-09 ENCOUNTER — Ambulatory Visit (INDEPENDENT_AMBULATORY_CARE_PROVIDER_SITE_OTHER): Payer: Medicare Other | Admitting: Pharmacist Clinician (PhC)/ Clinical Pharmacy Specialist

## 2013-06-09 DIAGNOSIS — R197 Diarrhea, unspecified: Secondary | ICD-10-CM | POA: Diagnosis not present

## 2013-06-09 DIAGNOSIS — I714 Abdominal aortic aneurysm, without rupture: Secondary | ICD-10-CM | POA: Diagnosis not present

## 2013-06-09 LAB — POCT INR: INR: 3

## 2013-06-10 ENCOUNTER — Other Ambulatory Visit (INDEPENDENT_AMBULATORY_CARE_PROVIDER_SITE_OTHER): Payer: Medicare Other

## 2013-06-10 DIAGNOSIS — R197 Diarrhea, unspecified: Secondary | ICD-10-CM | POA: Diagnosis not present

## 2013-06-10 DIAGNOSIS — I714 Abdominal aortic aneurysm, without rupture: Secondary | ICD-10-CM | POA: Diagnosis not present

## 2013-06-14 LAB — STOOL CULTURE

## 2013-06-16 LAB — C DIFFICILE, CYTOTOXIN B

## 2013-06-22 ENCOUNTER — Other Ambulatory Visit: Payer: Self-pay | Admitting: Nurse Practitioner

## 2013-07-15 ENCOUNTER — Other Ambulatory Visit: Payer: Self-pay | Admitting: Nurse Practitioner

## 2013-07-20 ENCOUNTER — Other Ambulatory Visit: Payer: Self-pay | Admitting: Family Medicine

## 2013-07-21 ENCOUNTER — Ambulatory Visit (INDEPENDENT_AMBULATORY_CARE_PROVIDER_SITE_OTHER): Payer: Medicare Other | Admitting: Pharmacist Clinician (PhC)/ Clinical Pharmacy Specialist

## 2013-07-21 DIAGNOSIS — I714 Abdominal aortic aneurysm, without rupture: Secondary | ICD-10-CM | POA: Diagnosis not present

## 2013-07-23 NOTE — Telephone Encounter (Signed)
NTBS for cholesterol meds

## 2013-07-23 NOTE — Telephone Encounter (Signed)
Last labs for lipids were 05/13 here

## 2013-07-24 NOTE — Telephone Encounter (Signed)
Patient aware will make appointment to come in and be seen and get her lab work done.

## 2013-07-31 ENCOUNTER — Other Ambulatory Visit: Payer: Self-pay | Admitting: Family Medicine

## 2013-08-03 ENCOUNTER — Telehealth: Payer: Self-pay | Admitting: Nurse Practitioner

## 2013-08-03 MED ORDER — ATORVASTATIN CALCIUM 40 MG PO TABS: ORAL_TABLET | ORAL | Status: DC

## 2013-08-03 NOTE — Telephone Encounter (Signed)
Med rf added at [facility]

## 2013-08-25 DIAGNOSIS — I2510 Atherosclerotic heart disease of native coronary artery without angina pectoris: Secondary | ICD-10-CM | POA: Diagnosis not present

## 2013-08-25 DIAGNOSIS — I059 Rheumatic mitral valve disease, unspecified: Secondary | ICD-10-CM | POA: Diagnosis not present

## 2013-08-25 DIAGNOSIS — I739 Peripheral vascular disease, unspecified: Secondary | ICD-10-CM | POA: Diagnosis not present

## 2013-08-25 DIAGNOSIS — E785 Hyperlipidemia, unspecified: Secondary | ICD-10-CM | POA: Diagnosis not present

## 2013-08-25 DIAGNOSIS — N289 Disorder of kidney and ureter, unspecified: Secondary | ICD-10-CM | POA: Diagnosis not present

## 2013-08-25 DIAGNOSIS — I119 Hypertensive heart disease without heart failure: Secondary | ICD-10-CM | POA: Diagnosis not present

## 2013-08-25 DIAGNOSIS — I252 Old myocardial infarction: Secondary | ICD-10-CM | POA: Diagnosis not present

## 2013-08-31 ENCOUNTER — Ambulatory Visit (INDEPENDENT_AMBULATORY_CARE_PROVIDER_SITE_OTHER): Payer: Medicare Other | Admitting: Nurse Practitioner

## 2013-08-31 ENCOUNTER — Encounter: Payer: Self-pay | Admitting: Nurse Practitioner

## 2013-08-31 VITALS — BP 181/80 | HR 85 | Temp 97.0°F | Ht 62.0 in | Wt 150.0 lb

## 2013-08-31 DIAGNOSIS — I2510 Atherosclerotic heart disease of native coronary artery without angina pectoris: Secondary | ICD-10-CM | POA: Diagnosis not present

## 2013-08-31 DIAGNOSIS — I714 Abdominal aortic aneurysm, without rupture: Secondary | ICD-10-CM | POA: Diagnosis not present

## 2013-08-31 DIAGNOSIS — I10 Essential (primary) hypertension: Secondary | ICD-10-CM | POA: Diagnosis not present

## 2013-08-31 DIAGNOSIS — E785 Hyperlipidemia, unspecified: Secondary | ICD-10-CM

## 2013-08-31 DIAGNOSIS — I951 Orthostatic hypotension: Secondary | ICD-10-CM

## 2013-08-31 DIAGNOSIS — K219 Gastro-esophageal reflux disease without esophagitis: Secondary | ICD-10-CM

## 2013-08-31 DIAGNOSIS — Z7901 Long term (current) use of anticoagulants: Secondary | ICD-10-CM

## 2013-08-31 DIAGNOSIS — J449 Chronic obstructive pulmonary disease, unspecified: Secondary | ICD-10-CM

## 2013-08-31 MED ORDER — HYDROCODONE-ACETAMINOPHEN 5-325 MG PO TABS: 1.0000 | ORAL_TABLET | Freq: Four times a day (QID) | ORAL | Status: DC | PRN

## 2013-08-31 NOTE — Progress Notes (Signed)
Subjective:    Patient ID: [name], female    DOB: 12-05-1932, 79 y.o.   MRN: [id number]  HPI  Patient here today for follow - up of chronic medical problems- She says she is doing well and has no complaints today: Patient Active Problem List   Diagnosis Date Noted  . AAA (abdominal aortic aneurysm) without rupture 03/24/2013  . Orthostatic hypotension 10/31/2012  . Occlusion and stenosis of carotid artery without mention of cerebral infarction 05/14/2012  . Chronic anticoagulation 08/07/2011  . H/O: CVA (cardiovascular accident) 08/07/2011  . Cerebrovascular disease 08/07/2011  . CAD (coronary artery disease) 08/07/2011  . PVD (peripheral vascular disease) 08/07/2011  . Benign hypertension 08/07/2011  . Hyperlipidemia 08/07/2011  . Status post dilatation of esophageal stricture 08/07/2011  . Ventricular aneurysm 08/07/2011  . GERD with stricture 08/07/2011  . COPD (chronic obstructive pulmonary disease) 08/07/2011   Outpatient Encounter Prescriptions as of 08/31/2013  Medication Sig Dispense Refill  . amLODipine (NORVASC) 5 MG tablet Take 5 mg by mouth daily.       [name] atorvastatin (LIPITOR) 40 MG tablet TAKE ONE TABLET BY MOUTH ONE TIME DAILY  30 tablet  1  . Calcium Carbonate 1500 MG TABS Take 1 tablet by mouth every morning.      . diphenoxylate-atropine (LOMOTIL) 2.5-0.025 MG per tablet Take 1 tablet by mouth 4 (four) times daily as needed for diarrhea or loose stools.  20 tablet  0  . fish oil-omega-3 fatty acids 1000 MG capsule Take 1 g by mouth every morning.       [name] HYDROcodone-acetaminophen (NORCO/VICODIN) 5-325 MG per tablet Take 1 tablet by mouth every 6 (six) hours as needed. For pain  30 tablet  0  . metoprolol (LOPRESSOR) 50 MG tablet TAKE ONE TABLET BY MOUTH TWICE DAILY  60 tablet  5  . triamterene-hydrochlorothiazide (MAXZIDE-25) 37.5-25 MG per tablet Take 1 tablet by mouth daily.      [name] warfarin (COUMADIN) 4 MG tablet TAKE 1 TABLET (4 MG TOTAL) BY MOUTH DAILY.   30 tablet  1   No facility-administered encounter medications on file as of 08/31/2013.       Review of Systems  Constitutional: Negative.   HENT: Negative.   Eyes: Negative.   Respiratory: Negative.   Cardiovascular: Negative.   Gastrointestinal: Negative.   Musculoskeletal: Positive for arthralgias.  Skin: Negative.   Neurological: Negative.   Hematological: Negative.   Psychiatric/Behavioral: Negative.        Objective:   Physical Exam  Constitutional: She is oriented to person, place, and time. She appears well-developed and well-nourished.  HENT:  Nose: Nose normal.  Mouth/Throat: Oropharynx is clear and moist.  Eyes: EOM are normal.  Neck: Trachea normal, normal range of motion and full passive range of motion without pain. Neck supple. No JVD present. Carotid bruit is not present. No thyromegaly present.  Cardiovascular: Normal rate, regular rhythm, normal heart sounds and intact distal pulses.  Exam reveals no gallop and no friction rub.   No murmur heard. Pulmonary/Chest: Effort normal and breath sounds normal.  Abdominal: Soft. Bowel sounds are normal. She exhibits no distension and no mass. There is no tenderness.  Musculoskeletal: Normal range of motion.  Lymphadenopathy:    She has no cervical adenopathy.  Neurological: She is alert and oriented to person, place, and time. She has normal reflexes.  Skin: Skin is warm and dry.  Psychiatric: She has a normal mood and affect. Her behavior is normal. Judgment and thought  content normal.    BP 181/80  Pulse 85  Temp(Src) 97 F (36.1 C) (Oral)  Ht 5\' 2"  (1.575 m)  Wt 150 lb (68.04 kg)  BMI 27.43 kg/m2       Assessment & Plan:   1. Hypertension   2. Hyperlipidemia LDL goal < 100   3. AAA (abdominal aortic aneurysm) without rupture   4. Orthostatic hypotension   5. Hyperlipidemia   6. GERD with stricture   7. COPD (chronic obstructive pulmonary disease)   8. Chronic anticoagulation   9. CAD  (coronary artery disease)   10. Benign hypertension    Orders Placed This Encounter  Procedures  . CMP14+EGFR  . NMR, lipoprofile   Continue all meds Labs pending Diet and exercise encouraged Health maintenance reviewed  Mary-Arielys Daphine Deutscher, FNP

## 2013-08-31 NOTE — Patient Instructions (Signed)
Anticoagulation Dose Instructions as of 08/31/2013     [name] Tue Wed Thu Fri Sat   New Dose 4 mg 4 mg 4 mg 2.5 mg 4 mg 4 mg 2 mg    Description       Take 1 tablet qd Except 1/2 tablet on Saturdays     Recheck in 2 weeks

## 2013-09-02 LAB — CMP14+EGFR
Albumin/Globulin Ratio: 2.1 (ref 1.1–2.5)
Albumin: 4.2 g/dL (ref 3.5–4.8)
Alkaline Phosphatase: 93 IU/L (ref 39–117)
BUN/Creatinine Ratio: 16 (ref 11–26)
BUN: 20 mg/dL (ref 8–27)
CO2: 24 mmol/L (ref 18–29)
Creatinine, Ser: 1.26 mg/dL — ABNORMAL HIGH (ref 0.57–1.00)
GFR calc Af Amer: 47 mL/min/{1.73_m2} — ABNORMAL LOW (ref >59)
GFR calc non Af Amer: 41 mL/min/{1.73_m2} — ABNORMAL LOW (ref >59)
Globulin, Total: 2 g/dL (ref 1.5–4.5)

## 2013-09-02 LAB — NMR, LIPOPROFILE
Cholesterol: 160 mg/dL (ref <200)
HDL Cholesterol by NMR: 42 mg/dL (ref >=40)
HDL Particle Number: 29.6 umol/L — ABNORMAL LOW (ref >=30.5)
LDLC SERPL CALC-MCNC: 70 mg/dL (ref <100)
LP-IR Score: 57 — ABNORMAL HIGH (ref <=45)

## 2013-09-15 ENCOUNTER — Ambulatory Visit (INDEPENDENT_AMBULATORY_CARE_PROVIDER_SITE_OTHER): Payer: Medicare Other | Admitting: Pharmacist Clinician (PhC)/ Clinical Pharmacy Specialist

## 2013-09-15 ENCOUNTER — Other Ambulatory Visit: Payer: Self-pay | Admitting: Nurse Practitioner

## 2013-09-15 DIAGNOSIS — I714 Abdominal aortic aneurysm, without rupture, unspecified: Secondary | ICD-10-CM

## 2013-09-22 ENCOUNTER — Other Ambulatory Visit: Payer: Self-pay | Admitting: Nurse Practitioner

## 2013-09-23 DIAGNOSIS — Z23 Encounter for immunization: Secondary | ICD-10-CM | POA: Diagnosis not present

## 2013-10-18 ENCOUNTER — Other Ambulatory Visit: Payer: Self-pay | Admitting: Nurse Practitioner

## 2013-11-03 ENCOUNTER — Ambulatory Visit (INDEPENDENT_AMBULATORY_CARE_PROVIDER_SITE_OTHER): Payer: Medicare Other | Admitting: Pharmacist Clinician (PhC)/ Clinical Pharmacy Specialist

## 2013-11-03 DIAGNOSIS — I714 Abdominal aortic aneurysm, without rupture: Secondary | ICD-10-CM | POA: Diagnosis not present

## 2013-11-03 LAB — POCT INR: INR: 2.3

## 2013-11-11 ENCOUNTER — Ambulatory Visit: Payer: Medicare Other | Admitting: Family

## 2013-11-11 ENCOUNTER — Other Ambulatory Visit (HOSPITAL_COMMUNITY): Payer: Medicare Other

## 2013-11-17 ENCOUNTER — Encounter: Payer: Self-pay | Admitting: Family

## 2013-11-18 ENCOUNTER — Ambulatory Visit (INDEPENDENT_AMBULATORY_CARE_PROVIDER_SITE_OTHER): Payer: Medicare Other | Admitting: Family

## 2013-11-18 ENCOUNTER — Encounter: Payer: Self-pay | Admitting: Family

## 2013-11-18 ENCOUNTER — Ambulatory Visit (HOSPITAL_COMMUNITY)
Admission: RE | Admit: 2013-11-18 | Discharge: 2013-11-18 | Disposition: A | Discharge status: Home / Self Care | Payer: Medicare Other | Source: Ambulatory Visit | Attending: Family | Admitting: Family

## 2013-11-18 DIAGNOSIS — I6529 Occlusion and stenosis of unspecified carotid artery: Secondary | ICD-10-CM | POA: Diagnosis not present

## 2013-11-18 DIAGNOSIS — Z48812 Encounter for surgical aftercare following surgery on the circulatory system: Secondary | ICD-10-CM | POA: Insufficient documentation

## 2013-11-18 NOTE — Patient Instructions (Signed)
Stroke Prevention Some medical conditions and behaviors are associated with an increased chance of having a stroke. You may prevent a stroke by making healthy choices and managing medical conditions. Reduce your risk of having a stroke by:  Staying physically active. Get at least 30 minutes of activity on most or all days.  Not smoking. It may also be helpful to avoid exposure to secondhand smoke.  Limiting alcohol use. Moderate alcohol use is considered to be:  No more than 2 drinks per day for men.  No more than 1 drink per day for nonpregnant women.  Eating healthy foods.  Include 5 or more servings of fruits and vegetables a day.  Certain diets may be prescribed to address high blood pressure, high cholesterol, diabetes, or obesity.  Managing your cholesterol levels.  A low-saturated fat, low-trans fat, low-cholesterol, and high-fiber diet may control cholesterol levels.  Take any prescribed medicines to control cholesterol as directed by your caregiver.  Managing your diabetes.  A controlled-carbohydrate, controlled-sugar diet is recommended to manage diabetes.  Take any prescribed medicines to control diabetes as directed by your caregiver.  Controlling your high blood pressure (hypertension).  A low-salt (sodium), low-saturated fat, low-trans fat, and low-cholesterol diet is recommended to manage high blood pressure.  Take any prescribed medicines to control hypertension as directed by your caregiver.  Maintaining a healthy weight.  A reduced-calorie, low-sodium, low-saturated fat, low-trans fat, low-cholesterol diet is recommended to manage weight.  Stopping drug abuse.  Avoiding birth control pills.  Talk to your caregiver about the risks of taking birth control pills if you are over 35 years old, smoke, get migraines, or have ever had a blood clot.  Getting evaluated for sleep disorders (sleep apnea).  Talk to your caregiver about getting a sleep evaluation  if you snore a lot or have excessive sleepiness.  Taking medicines as directed by your caregiver.  For some people, aspirin or blood thinners (anticoagulants) are helpful in reducing the risk of forming abnormal blood clots that can lead to stroke. If you have the irregular heart rhythm of atrial fibrillation, you should be on a blood thinner unless there is a good reason you cannot take them.  Understand all your medicine instructions. SEEK IMMEDIATE MEDICAL CARE IF:   You have sudden weakness or numbness of the face, arm, or leg, especially on one side of the body.  You have sudden confusion.  You have trouble speaking (aphasia) or understanding.  You have sudden trouble seeing in one or both eyes.  You have sudden trouble walking.  You have dizziness.  You have a loss of balance or coordination.  You have a sudden, severe headache with no known cause.  You have new chest pain or an irregular heartbeat. Any of these symptoms may represent a serious problem that is an emergency. Do not wait to see if the symptoms will go away. Get medical help right away. Call your local emergency services (911 in [place] Do not drive yourself to the hospital. Document Released: 01/03/2005 Document Revised: 02/18/2012 Document Reviewed: 05/29/2013 [employer] Patient Information 2014 [employer].  

## 2013-11-18 NOTE — Progress Notes (Signed)
Established Carotid Patient   History of Present Illness  [name] is a 79 y.o. female patient of Dr. Edilia Bo who used to be treated here by Dr. Madilyn Fireman. Patient underwent a left CEA and left CCA to subclavian bypass in July 2003. She's done well since that time and had a redo left CEA with Dr. Madilyn Fireman in 2008. Dr. Edilia Bo performed a temporal artery biopsy for this patient more recently. She returns today for follow up. Patient states rare tingling in left arm, not in right arm.  Recent mild dyspnea, denies cough.  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  The patient  denies facial drooping.  Pt. denies hemiplegia.  The patient denies receptive or expressive aphasia.  Pt. denies extremity weakness.   Patient denies New Medical or Surgical History.  Pt Diabetic: No Pt smoker: non-smoker  Pt meds include: Statin : Yes ASA: Yes Other anticoagulants/antiplatelets: on coumadin since her heart surgery   Past Medical History  Diagnosis Date  . Migraines   . Lower back pain     disc   . Osteoporosis   . Hyperlipidemia   . Hypertension   . Urethral stenosis   . Aortic aneurysm   . Cardiac aneurysm     ventrral - bypass   . History of aorto-femoral bypass   . Pancreatitis   . Stricture esophagus     Hx  . GERD (gastroesophageal reflux disease)   . Carotid bruit     bil carotid bruits   . Peptic ulcer     Hx   . Gout   . H/O blood clots     Social History History  Substance Use Topics  . Smoking status: Former Smoker    Quit date: 04/06/1998  . Smokeless tobacco: Never Used  . Alcohol Use: No    Family History Family History  Problem Relation Age of Onset  . CAD Father     died age 34+  . Osteoarthritis Sister     paraplegic due to disk rupture  . Colon cancer Neg Hx     Surgical History Past  Surgical History  Procedure Laterality Date  . Esophageal dilation  1996  . Lumbar disc surgery  1993  . Partial hysterectomy  1966    single oopherectomy (right)   . Aortic & cardiac aneurysm rupture  04/1998  . Aorta - bilateral femoral artery bypass graft  7/99    Dr. Madilyn Fireman   . Carotid endarterectomy  July 2003    Left  . Carotid endarterectomy  11/18/2007    Left    Allergies  Allergen Reactions  . Alendronate Sodium Other (See Comments)    Irritates esophagus and ulcers  . Buffered Aspirin Other (See Comments)    On blood thinners but doctor has started aspirin as stroke therapy  . Ibuprofen Other (See Comments)    Blood thinner therapy  . Prednisone Other (See Comments)    Patient states due to osteoporosis  . Vioxx [Rofecoxib] Other (See Comments)    Unknown by patient    Current Outpatient Prescriptions  Medication Sig Dispense Refill  . amLODipine (NORVASC) 5 MG tablet Take 5 mg by mouth daily.       [name] atorvastatin (LIPITOR) 40 MG tablet TAKE ONE TABLET BY MOUTH ONE TIME DAILY  30 tablet  4  . Calcium  Carbonate 1500 MG TABS Take 1 tablet by mouth every morning.      . diphenoxylate-atropine (LOMOTIL) 2.5-0.025 MG per tablet Take 1 tablet by mouth 4 (four) times daily as needed for diarrhea or loose stools.  20 tablet  0  . fish oil-omega-3 fatty acids 1000 MG capsule Take 1 g by mouth every morning.       [name] HYDROcodone-acetaminophen (NORCO/VICODIN) 5-325 MG per tablet Take 1 tablet by mouth every 6 (six) hours as needed. For pain  30 tablet  0  . metoprolol (LOPRESSOR) 50 MG tablet TAKE ONE TABLET BY MOUTH TWICE DAILY  60 tablet  4  . triamterene-hydrochlorothiazide (MAXZIDE-25) 37.5-25 MG per tablet Take 1 tablet by mouth daily.      [name] warfarin (COUMADIN) 4 MG tablet TAKE 1 TABLET (4 MG TOTAL) BY MOUTH DAILY.  30 tablet  2   No current facility-administered medications for this visit.    Physical Examination  Filed Vitals:   11/18/13 1109  BP: 152/72  Pulse:  65  Resp: 16   Filed Weights   11/18/13 1109  Weight: 151 lb (68.493 kg)   Body mass index is 27.61 kg/(m^2).  General: WDWN female in NAD GAIT: normal Eyes: Right pupil does not react to light.  Pulmonary:  Decreased air movement in left posterior fields, Negative  Rales, Negative rhonchi, & Negative wheezing.  Cardiac: regular Rhythm ,  Positive Murmur.  VASCULAR EXAM Carotid Bruits Left Right   Transmitted cardiac murmur Transmitted cardiac murmur     Right radial pulse is not palpable, right ulnar pulse is 1+, right brachial pulse is 3+. Left radial pulse is 2+.                                                                                                                          LE Pulses LEFT RIGHT       POPLITEAL  not palpable   not palpable       POSTERIOR TIBIAL   palpable    palpable        DORSALIS PEDIS      ANTERIOR TIBIAL not palpable  not palpable     Gastrointestinal: soft, nontender, BS WNL, no r/g,  negative masses.  Musculoskeletal: Negative muscle atrophy/wasting. M/S 4/5 throughout, Extremities without ischemic changes.  Neurologic: A&O X 3; Appropriate Affect ; SENSATION ;normal;  Speech is normal CN 2-12 intact except right pupil does not constrict to light, tongue deviates slightly to the right, Pain and light touch intact in extremities, Motor exam as listed above.   Non-Invasive Vascular Imaging CAROTID DUPLEX 11/18/2013   Right ICA: <40% stenosis. Left ICA: <40% stenosis. Increased velocity proximal right CCA, significant plaque in bilateral CCA's. SCA anastomosis 347 cm/sec, distal SCA-CCA BPG  304 cm/sec, patent left CCA-subclavian BPG.  Assessment: [name] is a 79 y.o. female who is s/p left CEA and left CCA to subclavian bypass in July 2003, redo left CEA in 2008, presents  with asymptomatic minimal  Bilateral ICA  Stenosis. However she has significant plaque in bilateral CCA's and SCA anastomosis.  Plan: Based on  today's exam and Duplex results, and after discussing with Dr. Edilia Bo, patient is advised to follow-up in 6 months with Carotid Duplex scan and Dr. Edilia Bo.   I discussed in depth with the patient the nature of atherosclerosis, and emphasized the importance of maximal medical management including strict control of blood pressure, blood glucose, and lipid levels, obtaining regular exercise, and continued cessation of smoking.  The patient is aware that without maximal medical management the underlying atherosclerotic disease process will progress, limiting the benefit of any interventions. The patient was given information about stroke prevention and what symptoms should prompt the patient to seek immediate medical care. Thank you for allowing [place] to participate in this patient's care.  Charisse March, RN, MSN, FNP-C [facility] Office: [phone]  Clinic Physician: Edilia Bo  11/18/2013 10:59 AM

## 2013-11-23 ENCOUNTER — Other Ambulatory Visit: Payer: Self-pay

## 2013-11-23 MED ORDER — WARFARIN SODIUM 4 MG PO TABS: 4.0000 mg | ORAL_TABLET | Freq: Every day | ORAL | Status: DC

## 2013-12-31 ENCOUNTER — Telehealth: Payer: Self-pay | Admitting: Pharmacist

## 2013-12-31 NOTE — Telephone Encounter (Signed)
Tried to call patient to make appt for recheck INR - no ans.

## 2014-01-11 ENCOUNTER — Ambulatory Visit (INDEPENDENT_AMBULATORY_CARE_PROVIDER_SITE_OTHER): Payer: Medicare Other | Admitting: Nurse Practitioner

## 2014-01-11 VITALS — BP 149/72 | HR 81 | Temp 97.4°F | Ht 62.0 in | Wt 144.0 lb

## 2014-01-11 DIAGNOSIS — I714 Abdominal aortic aneurysm, without rupture, unspecified: Secondary | ICD-10-CM

## 2014-01-11 DIAGNOSIS — I679 Cerebrovascular disease, unspecified: Secondary | ICD-10-CM

## 2014-01-11 DIAGNOSIS — J449 Chronic obstructive pulmonary disease, unspecified: Secondary | ICD-10-CM | POA: Diagnosis not present

## 2014-01-11 DIAGNOSIS — E785 Hyperlipidemia, unspecified: Secondary | ICD-10-CM

## 2014-01-11 DIAGNOSIS — I2510 Atherosclerotic heart disease of native coronary artery without angina pectoris: Secondary | ICD-10-CM

## 2014-01-11 DIAGNOSIS — K222 Esophageal obstruction: Secondary | ICD-10-CM

## 2014-01-11 DIAGNOSIS — J4489 Other specified chronic obstructive pulmonary disease: Secondary | ICD-10-CM

## 2014-01-11 DIAGNOSIS — I739 Peripheral vascular disease, unspecified: Secondary | ICD-10-CM | POA: Diagnosis not present

## 2014-01-11 DIAGNOSIS — Z7901 Long term (current) use of anticoagulants: Secondary | ICD-10-CM

## 2014-01-11 DIAGNOSIS — K219 Gastro-esophageal reflux disease without esophagitis: Secondary | ICD-10-CM

## 2014-01-11 LAB — POCT INR: INR: 5.2

## 2014-01-11 MED ORDER — ATORVASTATIN CALCIUM 40 MG PO TABS: 40.0000 mg | ORAL_TABLET | Freq: Every day | ORAL | Status: DC

## 2014-01-11 MED ORDER — METOPROLOL TARTRATE 50 MG PO TABS: 50.0000 mg | ORAL_TABLET | Freq: Two times a day (BID) | ORAL | Status: DC

## 2014-01-11 MED ORDER — HYDROCODONE-ACETAMINOPHEN 5-325 MG PO TABS: 1.0000 | ORAL_TABLET | Freq: Four times a day (QID) | ORAL | Status: DC | PRN

## 2014-01-11 MED ORDER — AMLODIPINE BESYLATE 5 MG PO TABS: 5.0000 mg | ORAL_TABLET | Freq: Every day | ORAL | Status: DC

## 2014-01-11 MED ORDER — TRIAMTERENE-HCTZ 37.5-25 MG PO TABS: 1.0000 | ORAL_TABLET | Freq: Every day | ORAL | Status: DC

## 2014-01-11 MED ORDER — WARFARIN SODIUM 4 MG PO TABS: 4.0000 mg | ORAL_TABLET | Freq: Every day | ORAL | Status: DC

## 2014-01-11 NOTE — Progress Notes (Signed)
Subjective:    Patient ID: [name], female    DOB: July 13, 1932, 80 y.o.   MRN: [id number]  HPI   Patient here today for follow - up of chronic medical problems- She says she is doing well but is very depressed - her husband died 12-24-2013. Her 2 son passed away years ago and she now feels al alone. Sh estill has chronic back pain that hes gotten worse lately. Pain no different that what she normally has. Patient Active Problem List   Diagnosis Date Noted  . Aftercare following surgery of the circulatory system, [place] 11/18/2013  . AAA (abdominal aortic aneurysm) without rupture 03/24/2013  . Orthostatic hypotension 10/31/2012  . Occlusion and stenosis of carotid artery without mention of cerebral infarction 05/14/2012  . Chronic anticoagulation 08/07/2011  . H/O: CVA (cardiovascular accident) 08/07/2011  . Cerebrovascular disease 08/07/2011  . CAD (coronary artery disease) 08/07/2011  . PVD (peripheral vascular disease) 08/07/2011  . Benign hypertension 08/07/2011  . Hyperlipidemia 08/07/2011  . Status post dilatation of esophageal stricture 08/07/2011  . Ventricular aneurysm 08/07/2011  . GERD with stricture 08/07/2011  . COPD (chronic obstructive pulmonary disease) 08/07/2011   Outpatient Encounter Prescriptions as of 01/11/2014  Medication Sig  . amLODipine (NORVASC) 5 MG tablet Take 5 mg by mouth daily.   [name] atorvastatin (LIPITOR) 40 MG tablet TAKE ONE TABLET BY MOUTH ONE TIME DAILY  . Calcium Carbonate 1500 MG TABS Take 1 tablet by mouth every morning.  . diphenoxylate-atropine (LOMOTIL) 2.5-0.025 MG per tablet Take 1 tablet by mouth 4 (four) times daily as needed for diarrhea or loose stools.  . fish oil-omega-3 fatty acids 1000 MG capsule Take 1 g by mouth every morning.   [name] HYDROcodone-acetaminophen (NORCO/VICODIN) 5-325 MG per tablet Take 1 tablet by mouth every 6 (six) hours as needed. For pain  . metoprolol (LOPRESSOR) 50 MG tablet TAKE ONE TABLET BY MOUTH TWICE  DAILY  . triamterene-hydrochlorothiazide (MAXZIDE-25) 37.5-25 MG per tablet Take 1 tablet by mouth daily.  [name] warfarin (COUMADIN) 4 MG tablet Take 1 tablet (4 mg total) by mouth daily.       Review of Systems  Constitutional: Negative.   HENT: Negative.   Eyes: Negative.   Respiratory: Negative.   Cardiovascular: Negative.   Gastrointestinal: Negative.   Musculoskeletal: Positive for arthralgias.  Skin: Negative.   Neurological: Negative.   Hematological: Negative.   Psychiatric/Behavioral: Negative.        Objective:   Physical Exam  Constitutional: She is oriented to person, place, and time. She appears well-developed and well-nourished.  HENT:  Nose: Nose normal.  Mouth/Throat: Oropharynx is clear and moist.  Eyes: EOM are normal.  Neck: Trachea normal, normal range of motion and full passive range of motion without pain. Neck supple. No JVD present. Carotid bruit is not present. No thyromegaly present.  Cardiovascular: Normal rate, regular rhythm, normal heart sounds and intact distal pulses.  Exam reveals no gallop and no friction rub.   No murmur heard. Pulmonary/Chest: Effort normal and breath sounds normal.  Abdominal: Soft. Bowel sounds are normal. She exhibits no distension and no mass. There is no tenderness.  Musculoskeletal: Normal range of motion.  Lymphadenopathy:    She has no cervical adenopathy.  Neurological: She is alert and oriented to person, place, and time. She has normal reflexes.  Skin: Skin is warm and dry.  Psychiatric: She has a normal mood and affect. Her behavior is normal. Judgment and thought content normal.  BP 149/72  Pulse 81  Temp(Src) 97.4 F (36.3 C) (Oral)  Ht $R'5\' 2"'NJ$  (1.575 m)  Wt 144 lb (65.318 kg)  BMI 26.33 kg/m2       Assessment & Plan:   1. PVD (peripheral vascular disease)   2. Hyperlipidemia   3. GERD with stricture   4. COPD (chronic obstructive pulmonary disease)   5. Cerebrovascular disease   6. Chronic  anticoagulation   7. CAD (coronary artery disease)   8. AAA (abdominal aortic aneurysm) without rupture    Orders Placed This Encounter  Procedures  . CMP14+EGFR  . NMR, lipoprofile   Meds ordered this encounter  Medications  . triamterene-hydrochlorothiazide (MAXZIDE-25) 37.5-25 MG per tablet    Sig: Take 1 tablet by mouth daily.    Dispense:  30 tablet    Refill:  5    Order Specific Question:  Supervising Provider    Answer:  Chipper Herb [[id number]]  . warfarin (COUMADIN) 4 MG tablet    Sig: Take 1 tablet (4 mg total) by mouth daily.    Dispense:  30 tablet    Refill:  5    Order Specific Question:  Supervising Provider    Answer:  Chipper Herb [[id number]]  . amLODipine (NORVASC) 5 MG tablet    Sig: Take 1 tablet (5 mg total) by mouth daily.    Dispense:  30 tablet    Refill:  5    Order Specific Question:  Supervising Provider    Answer:  Chipper Herb [[id number]]  . atorvastatin (LIPITOR) 40 MG tablet    Sig: Take 1 tablet (40 mg total) by mouth daily at 6 PM.    Dispense:  30 tablet    Refill:  5    Order Specific Question:  Supervising Provider    Answer:  Chipper Herb [[id number]]  . metoprolol (LOPRESSOR) 50 MG tablet    Sig: Take 1 tablet (50 mg total) by mouth 2 (two) times daily.    Dispense:  60 tablet    Refill:  5    Order Specific Question:  Supervising Provider    Answer:  Chipper Herb [[id number]]  . HYDROcodone-acetaminophen (NORCO/VICODIN) 5-325 MG per tablet    Sig: Take 1 tablet by mouth every 6 (six) hours as needed. For pain    Dispense:  60 tablet    Refill:  0    Order Specific Question:  Supervising Provider    Answer:  Chipper Herb [[id number]]    Labs pending Health maintenance reviewed Diet and exercise encouraged Continue all meds Follow up  In 3 month   [place], FNP

## 2014-01-11 NOTE — Patient Instructions (Signed)
Anticoagulation Dose Instructions as of 01/11/2014     Glynis SmilesSun Mon Tue Wed Thu Fri Sat   New Dose 4 mg 4 mg 0 mg 0 mg 2 mg 2 mg 2 mg    Description       Hold x 2 days then 1/2 tablet daily recheck on monday     Mary-Jacklyne Daphine DeutscherMartin, FNP

## 2014-01-13 LAB — CMP14+EGFR
ALBUMIN: 4.1 g/dL (ref 3.5–4.8)
ALT: 26 IU/L (ref 0–32)
AST: 23 IU/L (ref 0–40)
Albumin/Globulin Ratio: 1.8 (ref 1.1–2.5)
Alkaline Phosphatase: 82 IU/L (ref 39–117)
BUN/Creatinine Ratio: 13 (ref 11–26)
BUN: 24 mg/dL (ref 8–27)
CHLORIDE: 102 mmol/L (ref 97–108)
CO2: 21 mmol/L (ref 18–29)
CREATININE: 1.85 mg/dL — AB (ref 0.57–1.00)
Calcium: 9.6 mg/dL (ref 8.7–10.3)
GFR calc non Af Amer: 26 mL/min/{1.73_m2} — ABNORMAL LOW (ref >59)
GFR, EST AFRICAN AMERICAN: 29 mL/min/{1.73_m2} — AB (ref >59)
GLOBULIN, TOTAL: 2.3 g/dL (ref 1.5–4.5)
GLUCOSE: 96 mg/dL (ref 65–99)
Potassium: 4.3 mmol/L (ref 3.5–5.2)
Sodium: 142 mmol/L (ref 134–144)
TOTAL PROTEIN: 6.4 g/dL (ref 6.0–8.5)
Total Bilirubin: 0.8 mg/dL (ref 0.0–1.2)

## 2014-01-13 LAB — NMR, LIPOPROFILE
CHOLESTEROL: 130 mg/dL (ref <200)
HDL Cholesterol by NMR: 37 mg/dL — ABNORMAL LOW (ref >=40)
HDL Particle Number: 31.3 umol/L (ref >=30.5)
LDL PARTICLE NUMBER: 959 nmol/L (ref <1000)
LDL SIZE: 20.2 nm — AB (ref >20.5)
LDLC SERPL CALC-MCNC: 40 mg/dL (ref <100)
LP-IR Score: 63 — ABNORMAL HIGH (ref <=45)
Small LDL Particle Number: 694 nmol/L — ABNORMAL HIGH (ref <=527)
Triglycerides by NMR: 267 mg/dL — ABNORMAL HIGH (ref <150)

## 2014-01-13 NOTE — Telephone Encounter (Signed)
Patient called today - she has appt for Monday 01/18/14

## 2014-01-18 ENCOUNTER — Encounter: Payer: Self-pay | Admitting: Nurse Practitioner

## 2014-01-18 ENCOUNTER — Ambulatory Visit (INDEPENDENT_AMBULATORY_CARE_PROVIDER_SITE_OTHER): Payer: Medicare Other | Admitting: Nurse Practitioner

## 2014-01-18 VITALS — BP 96/58 | HR 63 | Temp 97.1°F | Ht 62.0 in | Wt 144.0 lb

## 2014-01-18 DIAGNOSIS — I714 Abdominal aortic aneurysm, without rupture, unspecified: Secondary | ICD-10-CM | POA: Diagnosis not present

## 2014-01-18 DIAGNOSIS — Z7901 Long term (current) use of anticoagulants: Secondary | ICD-10-CM

## 2014-01-18 LAB — POCT INR: INR: 2.3

## 2014-01-18 NOTE — Patient Instructions (Signed)
Anticoagulation Dose Instructions as of 01/18/2014     Glynis SmilesSun Mon Tue Wed Thu Fri Sat   New Dose 4 mg 4 mg 4 mg 2 mg 4 mg 2 mg 4 mg    Description       4 mg daily except 2mg  on wed and friday     Recheck in 1 month

## 2014-01-18 NOTE — Progress Notes (Signed)
   Subjective:    Patient ID: [name], female    DOB: 02/16/1932, 80 y.o.   MRN: [id number]  HPI Patient in today for recheck of INR- last week was 5.2    Review of Systems     Objective:   Physical Exam        Assessment & Plan:  See anticoag note

## 2014-02-22 DIAGNOSIS — N289 Disorder of kidney and ureter, unspecified: Secondary | ICD-10-CM | POA: Diagnosis not present

## 2014-02-22 DIAGNOSIS — I119 Hypertensive heart disease without heart failure: Secondary | ICD-10-CM | POA: Diagnosis not present

## 2014-02-22 DIAGNOSIS — I059 Rheumatic mitral valve disease, unspecified: Secondary | ICD-10-CM | POA: Diagnosis not present

## 2014-02-22 DIAGNOSIS — I252 Old myocardial infarction: Secondary | ICD-10-CM | POA: Diagnosis not present

## 2014-02-22 DIAGNOSIS — I2510 Atherosclerotic heart disease of native coronary artery without angina pectoris: Secondary | ICD-10-CM | POA: Diagnosis not present

## 2014-02-22 DIAGNOSIS — I739 Peripheral vascular disease, unspecified: Secondary | ICD-10-CM | POA: Diagnosis not present

## 2014-02-22 DIAGNOSIS — E785 Hyperlipidemia, unspecified: Secondary | ICD-10-CM | POA: Diagnosis not present

## 2014-03-02 ENCOUNTER — Ambulatory Visit (INDEPENDENT_AMBULATORY_CARE_PROVIDER_SITE_OTHER): Payer: Medicare Other | Admitting: Pharmacist Clinician (PhC)/ Clinical Pharmacy Specialist

## 2014-03-02 ENCOUNTER — Encounter (INDEPENDENT_AMBULATORY_CARE_PROVIDER_SITE_OTHER): Payer: Self-pay

## 2014-03-02 DIAGNOSIS — I714 Abdominal aortic aneurysm, without rupture, unspecified: Secondary | ICD-10-CM

## 2014-03-02 LAB — POCT INR: INR: 6.2

## 2014-03-03 ENCOUNTER — Ambulatory Visit (INDEPENDENT_AMBULATORY_CARE_PROVIDER_SITE_OTHER): Payer: Medicare Other | Admitting: *Deleted

## 2014-03-03 ENCOUNTER — Ambulatory Visit (INDEPENDENT_AMBULATORY_CARE_PROVIDER_SITE_OTHER): Payer: Medicare Other | Admitting: Pharmacist

## 2014-03-03 ENCOUNTER — Telehealth: Payer: Self-pay | Admitting: Nurse Practitioner

## 2014-03-03 DIAGNOSIS — I714 Abdominal aortic aneurysm, without rupture, unspecified: Secondary | ICD-10-CM

## 2014-03-03 DIAGNOSIS — R791 Abnormal coagulation profile: Secondary | ICD-10-CM

## 2014-03-03 LAB — POCT CBC
GRANULOCYTE PERCENT: 77.3 % (ref 37–80)
HEMATOCRIT: 38.8 % (ref 37.7–47.9)
Hemoglobin: 12.6 g/dL (ref 12.2–16.2)
Lymph, poc: 0.9 (ref 0.6–3.4)
MCH: 29 pg (ref 27–31.2)
MCHC: 32.5 g/dL (ref 31.8–35.4)
MCV: 89.2 fL (ref 80–97)
MPV: 7.6 fL (ref 0–99.8)
POC Granulocyte: 5.1 (ref 2–6.9)
POC LYMPH PERCENT: 14.3 %L (ref 10–50)
Platelet Count, POC: 204 10*3/uL (ref 142–424)
RBC: 4.3 M/uL (ref 4.04–5.48)
RDW, POC: 14 %
WBC: 6.6 10*3/uL (ref 4.6–10.2)

## 2014-03-03 LAB — POCT INR: INR: 2

## 2014-03-03 NOTE — Telephone Encounter (Signed)
Patient called - see anticoagulation note. 

## 2014-03-09 ENCOUNTER — Ambulatory Visit (INDEPENDENT_AMBULATORY_CARE_PROVIDER_SITE_OTHER): Payer: Medicare Other | Admitting: Pharmacist Clinician (PhC)/ Clinical Pharmacy Specialist

## 2014-03-09 ENCOUNTER — Ambulatory Visit (INDEPENDENT_AMBULATORY_CARE_PROVIDER_SITE_OTHER): Payer: Medicare Other

## 2014-03-09 ENCOUNTER — Ambulatory Visit (INDEPENDENT_AMBULATORY_CARE_PROVIDER_SITE_OTHER): Payer: Medicare Other | Admitting: Family Medicine

## 2014-03-09 VITALS — BP 106/51 | HR 73 | Temp 97.0°F | Ht 62.0 in | Wt 138.4 lb

## 2014-03-09 DIAGNOSIS — I6529 Occlusion and stenosis of unspecified carotid artery: Secondary | ICD-10-CM | POA: Diagnosis not present

## 2014-03-09 DIAGNOSIS — E785 Hyperlipidemia, unspecified: Secondary | ICD-10-CM

## 2014-03-09 DIAGNOSIS — R05 Cough: Secondary | ICD-10-CM | POA: Diagnosis not present

## 2014-03-09 DIAGNOSIS — Z8673 Personal history of transient ischemic attack (TIA), and cerebral infarction without residual deficits: Secondary | ICD-10-CM

## 2014-03-09 DIAGNOSIS — I714 Abdominal aortic aneurysm, without rupture, unspecified: Secondary | ICD-10-CM

## 2014-03-09 DIAGNOSIS — I253 Aneurysm of heart: Secondary | ICD-10-CM

## 2014-03-09 DIAGNOSIS — J209 Acute bronchitis, unspecified: Secondary | ICD-10-CM | POA: Diagnosis not present

## 2014-03-09 DIAGNOSIS — I2510 Atherosclerotic heart disease of native coronary artery without angina pectoris: Secondary | ICD-10-CM | POA: Diagnosis not present

## 2014-03-09 DIAGNOSIS — Z8719 Personal history of other diseases of the digestive system: Secondary | ICD-10-CM

## 2014-03-09 DIAGNOSIS — I739 Peripheral vascular disease, unspecified: Secondary | ICD-10-CM

## 2014-03-09 DIAGNOSIS — J0110 Acute frontal sinusitis, unspecified: Secondary | ICD-10-CM | POA: Diagnosis not present

## 2014-03-09 DIAGNOSIS — R059 Cough, unspecified: Secondary | ICD-10-CM

## 2014-03-09 DIAGNOSIS — Z9889 Other specified postprocedural states: Secondary | ICD-10-CM

## 2014-03-09 DIAGNOSIS — J449 Chronic obstructive pulmonary disease, unspecified: Secondary | ICD-10-CM

## 2014-03-09 DIAGNOSIS — I679 Cerebrovascular disease, unspecified: Secondary | ICD-10-CM

## 2014-03-09 DIAGNOSIS — Z7901 Long term (current) use of anticoagulants: Secondary | ICD-10-CM

## 2014-03-09 LAB — POCT CBC
Granulocyte percent: 82.4 %G — AB (ref 37–80)
HCT, POC: 38.8 % (ref 37.7–47.9)
Hemoglobin: 12.1 g/dL — AB (ref 12.2–16.2)
Lymph, poc: 0.9 (ref 0.6–3.4)
MCH, POC: 28.1 pg (ref 27–31.2)
MCHC: 31.2 g/dL — AB (ref 31.8–35.4)
MCV: 89.8 fL (ref 80–97)
MPV: 7.6 fL (ref 0–99.8)
POC Granulocyte: 7.4 — AB (ref 2–6.9)
POC LYMPH PERCENT: 10.4 %L (ref 10–50)
Platelet Count, POC: 296 10*3/uL (ref 142–424)
RBC: 4.3 M/uL (ref 4.04–5.48)
RDW, POC: 13.4 %
WBC: 9 10*3/uL (ref 4.6–10.2)

## 2014-03-09 LAB — POCT INR: INR: 1.1

## 2014-03-09 MED ORDER — CEFDINIR 300 MG PO CAPS: 300.0000 mg | ORAL_CAPSULE | Freq: Two times a day (BID) | ORAL | Status: DC

## 2014-03-09 MED ORDER — FLUTICASONE PROPIONATE 50 MCG/ACT NA SUSP: 2.0000 | Freq: Every day | NASAL | Status: DC

## 2014-03-09 NOTE — Patient Instructions (Signed)
Sinusitis  Sinusitis is redness, soreness, and swelling (inflammation) of the paranasal sinuses. Paranasal sinuses are air pockets within the bones of your face (beneath the eyes, the middle of the forehead, or above the eyes). In healthy paranasal sinuses, mucus is able to drain out, and air is able to circulate through them by way of your nose. However, when your paranasal sinuses are inflamed, mucus and air can become trapped. This can allow bacteria and other germs to grow and cause infection.  Sinusitis can develop quickly and last only a short time (acute) or continue over a long period (chronic). Sinusitis that lasts for more than 12 weeks is considered chronic.   CAUSES   Causes of sinusitis include:   Allergies.   Structural abnormalities, such as displacement of the cartilage that separates your nostrils (deviated septum), which can decrease the air flow through your nose and sinuses and affect sinus drainage.   Functional abnormalities, such as when the small hairs (cilia) that line your sinuses and help remove mucus do not work properly or are not present.  SYMPTOMS   Symptoms of acute and chronic sinusitis are the same. The primary symptoms are pain and pressure around the affected sinuses. Other symptoms include:   Upper toothache.   Earache.   Headache.   Bad breath.   Decreased sense of smell and taste.   A cough, which worsens when you are lying flat.   Fatigue.   Fever.   Thick drainage from your nose, which often is green and may contain pus (purulent).   Swelling and warmth over the affected sinuses.  DIAGNOSIS   Your caregiver will perform a physical exam. During the exam, your caregiver may:   Look in your nose for signs of abnormal growths in your nostrils (nasal polyps).   Tap over the affected sinus to check for signs of infection.   View the inside of your sinuses (endoscopy) with a special imaging device with a light attached (endoscope), which is inserted into your  sinuses.  If your caregiver suspects that you have chronic sinusitis, one or more of the following tests may be recommended:   Allergy tests.   Nasal culture A sample of mucus is taken from your nose and sent to a lab and screened for bacteria.   Nasal cytology A sample of mucus is taken from your nose and examined by your caregiver to determine if your sinusitis is related to an allergy.  TREATMENT   Most cases of acute sinusitis are related to a viral infection and will resolve on their own within 10 days. Sometimes medicines are prescribed to help relieve symptoms (pain medicine, decongestants, nasal steroid sprays, or saline sprays).   However, for sinusitis related to a bacterial infection, your caregiver will prescribe antibiotic medicines. These are medicines that will help kill the bacteria causing the infection.   Rarely, sinusitis is caused by a fungal infection. In theses cases, your caregiver will prescribe antifungal medicine.  For some cases of chronic sinusitis, surgery is needed. Generally, these are cases in which sinusitis recurs more than 3 times per year, despite other treatments.  HOME CARE INSTRUCTIONS    Drink plenty of water. Water helps thin the mucus so your sinuses can drain more easily.   Use a humidifier.   Inhale steam 3 to 4 times a day (for example, sit in the bathroom with the shower running).   Apply a warm, moist washcloth to your face 3 to 4 times a day,   or as directed by your caregiver.   Use saline nasal sprays to help moisten and clean your sinuses.   Take over-the-counter or prescription medicines for pain, discomfort, or fever only as directed by your caregiver.  SEEK IMMEDIATE MEDICAL CARE IF:   You have increasing pain or severe headaches.   You have nausea, vomiting, or drowsiness.   You have swelling around your face.   You have vision problems.   You have a stiff neck.   You have difficulty breathing.  MAKE SURE YOU:    Understand these  instructions.   Will watch your condition.   Will get help right away if you are not doing well or get worse.  Document Released: 11/26/2005 Document Revised: 02/18/2012 Document Reviewed: 12/11/2011  [employer] Patient Information 2014 [employer]          Bronchitis  Bronchitis is inflammation of the airways that extend from the windpipe into the lungs (bronchi). The inflammation often causes mucus to develop, which leads to a cough. If the inflammation becomes severe, it may cause shortness of breath.  CAUSES   Bronchitis may be caused by:    Viral infections.    Bacteria.    Cigarette smoke.    Allergens, pollutants, and other irritants.   SIGNS AND SYMPTOMS   The most common symptom of bronchitis is a frequent cough that produces mucus. Other symptoms include:   Fever.    Body aches.    Chest congestion.    Chills.    Shortness of breath.    Sore throat.   DIAGNOSIS   Bronchitis is usually diagnosed through a medical history and physical exam. Tests, such as chest X-rays, are sometimes done to rule out other conditions.   TREATMENT   You may need to avoid contact with whatever caused the problem (smoking, for example). Medicines are sometimes needed. These may include:   Antibiotics. These may be prescribed if the condition is caused by bacteria.   Cough suppressants. These may be prescribed for relief of cough symptoms.    Inhaled medicines. These may be prescribed to help open your airways and make it easier for you to breathe.    Steroid medicines. These may be prescribed for those with recurrent (chronic) bronchitis.  HOME CARE INSTRUCTIONS   Get plenty of rest.    Drink enough fluids to keep your urine clear or pale yellow (unless you have a medical condition that requires fluid restriction). Increasing fluids may help thin your secretions and will prevent dehydration.    Only take over-the-counter or prescription medicines as directed by your health care  provider.   Only take antibiotics as directed. Make sure you finish them even if you start to feel better.   Avoid secondhand smoke, irritating chemicals, and strong fumes. These will make bronchitis worse. If you are a smoker, quit smoking. Consider using nicotine gum or skin patches to help control withdrawal symptoms. Quitting smoking will help your lungs heal faster.    Put a cool-mist humidifier in your bedroom at night to moisten the air. This may help loosen mucus. Change the water in the humidifier daily. You can also run the hot water in your shower and sit in the bathroom with the door closed for 5 10 minutes.    Follow up with your health care provider as directed.    Wash your hands frequently to avoid catching bronchitis again or spreading an infection to others.   SEEK MEDICAL CARE IF:  Your symptoms   do not improve after 1 week of treatment.   SEEK IMMEDIATE MEDICAL CARE IF:   Your fever increases.   You have chills.    You have chest pain.    You have worsening shortness of breath.    You have bloody sputum.   You faint.   You have lightheadedness.   You have a severe headache.    You vomit repeatedly.  MAKE SURE YOU:    Understand these instructions.   Will watch your condition.   Will get help right away if you are not doing well or get worse.  Document Released: 11/26/2005 Document Revised: 09/16/2013 Document Reviewed: 07/21/2013  [employer] Patient Information 2014 [employer]

## 2014-03-09 NOTE — Progress Notes (Signed)
Patient ID: [name], female   DOB: 1932/06/11, 80 y.o.   MRN: [id number] SUBJECTIVE: CC: Chief Complaint  Patient presents with  . cough and congestion x 2 weeks    HPI: 2 weeks now. Started with runny nose and congestion. Nose still running. Head hurts and forehead hurts. Then coughing a lot. No phlegm. Yellow mucus from nose. No fever. No SOB. No wheezing.  ex-smoker stopped 15 years ago. No inhalers.   Past Medical History  Diagnosis Date  . Migraines   . Lower back pain     disc   . Osteoporosis   . Hyperlipidemia   . Hypertension   . Urethral stenosis   . Aortic aneurysm   . Cardiac aneurysm     ventrral - bypass   . History of aorto-femoral bypass   . Pancreatitis   . Stricture esophagus     Hx  . GERD (gastroesophageal reflux disease)   . Carotid bruit     bil carotid bruits   . Peptic ulcer     Hx   . Gout   . H/O blood clots   . Carotid artery occlusion    Past Surgical History  Procedure Laterality Date  . Esophageal dilation  1996  . Lumbar disc surgery  1993  . Partial hysterectomy  1966    single oopherectomy (right)   . Aortic & cardiac aneurysm rupture  04/1998  . Aorta - bilateral femoral artery bypass graft  7/99    Dr. Madilyn Fireman   . Carotid endarterectomy  July 2003    Left  . Carotid endarterectomy  11/18/2007    Left   History   Social History  . Marital Status: Married    Spouse Name: N/A    Number of Children: N/A  . Years of Education: N/A   Occupational History  . retired     [name] in [place], [place]   Social History Main Topics  . Smoking status: Former Smoker    Quit date: 04/06/1998  . Smokeless tobacco: Never Used  . Alcohol Use: No  . Drug Use: No  . Sexual Activity: Not on file   Other Topics Concern  . Not on file   Social History Narrative  . No narrative on file   Family History  Problem Relation Age of Onset  . CAD Father     died age 55+  . Heart disease Father   . Heart attack Father   .  Osteoarthritis Sister     paraplegic due to disk rupture  . Colon cancer Neg Hx    Current Outpatient Prescriptions on File Prior to Visit  Medication Sig Dispense Refill  . amLODipine (NORVASC) 5 MG tablet Take 1 tablet (5 mg total) by mouth daily.  30 tablet  5  . atorvastatin (LIPITOR) 40 MG tablet Take 1 tablet (40 mg total) by mouth daily at 6 PM.  30 tablet  5  . Calcium Carbonate 1500 MG TABS Take 1 tablet by mouth every morning.      . diphenoxylate-atropine (LOMOTIL) 2.5-0.025 MG per tablet Take 1 tablet by mouth 4 (four) times daily as needed for diarrhea or loose stools.  20 tablet  0  . fish oil-omega-3 fatty acids 1000 MG capsule Take 1 g by mouth every morning.       [name] HYDROcodone-acetaminophen (NORCO/VICODIN) 5-325 MG per tablet Take 1 tablet by mouth every 6 (six) hours as needed. For pain  60 tablet  0  .  metoprolol (LOPRESSOR) 50 MG tablet Take 1 tablet (50 mg total) by mouth 2 (two) times daily.  60 tablet  5  . triamterene-hydrochlorothiazide (MAXZIDE-25) 37.5-25 MG per tablet Take 1 tablet by mouth daily.  30 tablet  5  . warfarin (COUMADIN) 4 MG tablet Take 1 tablet (4 mg total) by mouth daily.  30 tablet  5   No current facility-administered medications on file prior to visit.   Allergies  Allergen Reactions  . Alendronate Sodium Other (See Comments)    Irritates esophagus and ulcers  . Buffered Aspirin Other (See Comments)    On blood thinners but doctor has started aspirin as stroke therapy  . Ibuprofen Other (See Comments)    Blood thinner therapy  . Prednisone Other (See Comments)    Patient states due to osteoporosis  . Vioxx [Rofecoxib] Other (See Comments)    Unknown by patient    There is no immunization history on file for this patient. Prior to Admission medications   Medication Sig Start Date End Date Taking? Authorizing Provider  amLODipine (NORVASC) 5 MG tablet Take 1 tablet (5 mg total) by mouth daily. 01/11/14  Yes Mary-Gwendolyne Daphine Deutscher, FNP   atorvastatin (LIPITOR) 40 MG tablet Take 1 tablet (40 mg total) by mouth daily at 6 PM. 01/11/14  Yes Mary-Santina Daphine Deutscher, FNP  Calcium Carbonate 1500 MG TABS Take 1 tablet by mouth every morning. 08/09/11  Yes Henderson Cloud, MD  diphenoxylate-atropine (LOMOTIL) 2.5-0.025 MG per tablet Take 1 tablet by mouth 4 (four) times daily as needed for diarrhea or loose stools. 06/04/13  Yes Mary-Tashawnda Daphine Deutscher, FNP  fish oil-omega-3 fatty acids 1000 MG capsule Take 1 g by mouth every morning.    Yes Historical Provider, MD  HYDROcodone-acetaminophen (NORCO/VICODIN) 5-325 MG per tablet Take 1 tablet by mouth every 6 (six) hours as needed. For pain 01/11/14  Yes Mary-Alexus Daphine Deutscher, FNP  metoprolol (LOPRESSOR) 50 MG tablet Take 1 tablet (50 mg total) by mouth 2 (two) times daily. 01/11/14  Yes Mary-Franchesca Daphine Deutscher, FNP  triamterene-hydrochlorothiazide (MAXZIDE-25) 37.5-25 MG per tablet Take 1 tablet by mouth daily. 01/11/14  Yes Mary-Lanisha Daphine Deutscher, FNP  warfarin (COUMADIN) 4 MG tablet Take 1 tablet (4 mg total) by mouth daily. 01/11/14  Yes Mary-Skyrah Daphine Deutscher, FNP     ROS: As above in the HPI. All other systems are stable or negative.  OBJECTIVE: APPEARANCE:  Patient in no acute distress.The patient appeared well nourished and normally developed. Acyanotic. Waist: VITAL SIGNS:BP 106/51  Pulse 73  Temp(Src) 97 F (36.1 C) (Oral)  Ht 5\' 2"  (1.575 m)  Wt 138 lb 6.4 oz (62.778 kg)  BMI 25.31 kg/m2  WF congested SKIN: warm and  Dry without overt rashes, tattoos and scars  HEAD and Neck: without JVD, Head and scalp: normal Eyes:No scleral icterus. Fundi normal, eye movements normal. Ears: Auricle normal, canal normal, Tympanic membranes normal, insufflation normal. Nose: nasal coryza. Frontal sinus area tender.  Throat: normal Neck & thyroid: normal  CHEST & LUNGS: Chest wall: normal Lungs: Clear  CVS: Reveals the PMI to be normally located. Regular rhythm, First and Second  Heart sounds are normal,  absence of murmurs, rubs or gallops. Peripheral vasculature: Radial pulses: normal Dorsal pedis pulses: normal Posterior pulses: normal  ABDOMEN:  Appearance: normal Benign, no organomegaly, no masses, no Abdominal Aortic enlargement. No Guarding , no rebound. No Bruits. Bowel sounds: normal  RECTAL: N/A GU: N/A  EXTREMETIES: nonedematous.  MUSCULOSKELETAL:  Spine: normal Joints: intact  NEUROLOGIC: oriented  to time,place and person; nonfocal. Strength is normal Sensory is normal Reflexes are normal Cranial Nerves are normal.  ASSESSMENT:  Cough - Plan: DG Chest 2 View, POCT CBC  Sinusitis, acute frontal - Plan: fluticasone (FLONASE) 50 MCG/ACT nasal spray, cefdinir (OMNICEF) 300 MG capsule  Acute bronchitis - Plan: cefdinir (OMNICEF) 300 MG capsule  Ventricular aneurysm  Occlusion and stenosis of carotid artery without mention of cerebral infarction  Hyperlipidemia  H/O  Cerebrovascular disease  CAD (coronary artery disease)  AAA (abdominal aortic aneurysm) without rupture  Chronic anticoagulation  COPD (chronic obstructive pulmonary disease)  Status post dilatation of esophageal stricture  PVD (peripheral vascular disease)  PLAN:  Orders Placed This Encounter  Procedures  . DG Chest 2 View    Standing Status: Future     Number of Occurrences: 1     Standing Expiration Date: 05/09/2015    Order Specific Question:  Reason for Exam (SYMPTOM  OR DIAGNOSIS REQUIRED)    Answer:  cough    Order Specific Question:  Preferred imaging location?    Answer:  Internal  . POCT CBC  [facility] reading (PRIMARY) by  Dr.Adriannah Steinkamp: left hemidiaphragm elevation. Chronic changes.  No acute findings                               Results for orders placed in visit on 03/09/14  POCT CBC      Result Value Ref Range   WBC 9.0  4.6 - 10.2 K/uL   Lymph, poc 0.9  0.6 - 3.4   POC LYMPH PERCENT 10.4  10 - 50 %L   MID (cbc)    0 - 0.9   POC MID %    0  - 12 %M   POC Granulocyte 7.4 (*) 2 - 6.9   Granulocyte percent 82.4 (*) 37 - 80 %G   RBC 4.3  4.04 - 5.48 M/uL   Hemoglobin 12.1 (*) 12.2 - 16.2 g/dL   HCT, POC 16.138.8  09.637.7 - 47.9 %   MCV 89.8  80 - 97 fL   MCH, POC 28.1  27 - 31.2 pg   MCHC 31.2 (*) 31.8 - 35.4 g/dL   RDW, POC 04.513.4     Platelet Count, POC 296.0  142 - 424 K/uL   MPV 7.6  0 - 99.8 fL   [facility] reading (PRIMARY) by  Dr. Modesto CharonWong : elevated left diaphragm, chronic changes. Sternotomy wires.                                 Meds ordered this encounter  Medications  . fluticasone (FLONASE) 50 MCG/ACT nasal spray    Sig: Place 2 sprays into both nostrils daily.    Dispense:  16 g    Refill:  2  . cefdinir (OMNICEF) 300 MG capsule    Sig: Take 1 capsule (300 mg total) by mouth 2 (two) times daily.    Dispense:  20 capsule    Refill:  0  if any neurologic symptoms occurs. Patient to call 911 or proceed to the ED. Risks with frontal sinusitis discussed.   There are no discontinued medications. Return if symptoms worsen or fail to improve.  [name] P. [place] CharonWong, M.D.

## 2014-03-10 ENCOUNTER — Other Ambulatory Visit: Payer: Self-pay | Admitting: Family Medicine

## 2014-03-10 DIAGNOSIS — R911 Solitary pulmonary nodule: Secondary | ICD-10-CM

## 2014-03-10 NOTE — Progress Notes (Signed)
Quick Note:  Call Patient  Chest Xray had a 8 mm nodule that needs to be further evaluated with a CT scan.: I tried calling her at home but no one picked up the phone.   Recommendations: CT chest ordered in EPIC   ______

## 2014-03-16 ENCOUNTER — Ambulatory Visit (HOSPITAL_COMMUNITY)
Admission: RE | Admit: 2014-03-16 | Discharge: 2014-03-16 | Disposition: A | Discharge status: Home / Self Care | Payer: Medicare Other | Source: Ambulatory Visit | Attending: Family Medicine | Admitting: Family Medicine

## 2014-03-16 ENCOUNTER — Ambulatory Visit (INDEPENDENT_AMBULATORY_CARE_PROVIDER_SITE_OTHER): Payer: Medicare Other | Admitting: Pharmacist Clinician (PhC)/ Clinical Pharmacy Specialist

## 2014-03-16 DIAGNOSIS — R0602 Shortness of breath: Secondary | ICD-10-CM | POA: Insufficient documentation

## 2014-03-16 DIAGNOSIS — I714 Abdominal aortic aneurysm, without rupture, unspecified: Secondary | ICD-10-CM

## 2014-03-16 DIAGNOSIS — Z951 Presence of aortocoronary bypass graft: Secondary | ICD-10-CM | POA: Insufficient documentation

## 2014-03-16 DIAGNOSIS — J438 Other emphysema: Secondary | ICD-10-CM | POA: Diagnosis not present

## 2014-03-16 DIAGNOSIS — K449 Diaphragmatic hernia without obstruction or gangrene: Secondary | ICD-10-CM | POA: Insufficient documentation

## 2014-03-16 DIAGNOSIS — R911 Solitary pulmonary nodule: Secondary | ICD-10-CM | POA: Diagnosis not present

## 2014-03-16 LAB — POCT INR: INR: 2.7

## 2014-04-06 ENCOUNTER — Ambulatory Visit (INDEPENDENT_AMBULATORY_CARE_PROVIDER_SITE_OTHER): Payer: Medicare Other | Admitting: Pharmacist Clinician (PhC)/ Clinical Pharmacy Specialist

## 2014-04-06 DIAGNOSIS — I714 Abdominal aortic aneurysm, without rupture, unspecified: Secondary | ICD-10-CM

## 2014-04-06 LAB — POCT INR: INR: 2.9

## 2014-04-29 ENCOUNTER — Telehealth: Payer: Self-pay | Admitting: Nurse Practitioner

## 2014-04-29 NOTE — Telephone Encounter (Signed)
[facility]

## 2014-04-30 ENCOUNTER — Ambulatory Visit (INDEPENDENT_AMBULATORY_CARE_PROVIDER_SITE_OTHER): Payer: Medicare Other

## 2014-04-30 ENCOUNTER — Encounter: Payer: Self-pay | Admitting: Nurse Practitioner

## 2014-04-30 ENCOUNTER — Ambulatory Visit (INDEPENDENT_AMBULATORY_CARE_PROVIDER_SITE_OTHER): Payer: Medicare Other | Admitting: Nurse Practitioner

## 2014-04-30 VITALS — BP 166/86 | HR 66 | Temp 98.2°F | Ht 62.0 in | Wt 139.0 lb

## 2014-04-30 DIAGNOSIS — M549 Dorsalgia, unspecified: Secondary | ICD-10-CM | POA: Diagnosis not present

## 2014-04-30 DIAGNOSIS — M25532 Pain in left wrist: Secondary | ICD-10-CM

## 2014-04-30 DIAGNOSIS — I6529 Occlusion and stenosis of unspecified carotid artery: Secondary | ICD-10-CM | POA: Diagnosis not present

## 2014-04-30 DIAGNOSIS — M25539 Pain in unspecified wrist: Secondary | ICD-10-CM | POA: Diagnosis not present

## 2014-04-30 MED ORDER — METHYLPREDNISOLONE ACETATE 80 MG/ML IJ SUSP
80.0000 mg | Freq: Once | INTRAMUSCULAR | Status: AC
  Administered 2014-04-30: 80 mg via INTRAMUSCULAR

## 2014-04-30 MED ORDER — HYDROCODONE-ACETAMINOPHEN 5-325 MG PO TABS: 1.0000 | ORAL_TABLET | Freq: Four times a day (QID) | ORAL | Status: DC | PRN

## 2014-04-30 NOTE — Progress Notes (Signed)
   Subjective:    Patient ID: [name], female    DOB: [birth year], 80 y.o.   MRN: [id number]  HPI Patient in c/o recurrent back pain which she says has gotten worse- patient trying to stay active. She also says that left wrist is hurting all the time- Has a knot on left wrist.    Review of Systems  Constitutional: Negative.   HENT: Negative.   Respiratory: Negative.   Cardiovascular: Negative.   Genitourinary: Negative.   Psychiatric/Behavioral: Negative.   All other systems reviewed and are negative.      Objective:   Physical Exam  Constitutional: She is oriented to person, place, and time. She appears well-developed and well-nourished.  Cardiovascular: Normal rate, regular rhythm and normal heart sounds.   Pulmonary/Chest: Effort normal and breath sounds normal.  Musculoskeletal:  FROM of lumbar spine with pain on rotation and flexion- pain radiating to right buttocks. Left wrist nodule with pain on flexion and extension of wrist.   Neurological: She is alert and oriented to person, place, and time.  Skin: Skin is warm and dry.  Psychiatric: She has a normal mood and affect. Her behavior is normal. Judgment and thought content normal.   BP 166/86  Pulse 66  Temp(Src) 98.2 F (36.8 C) (Oral)  Ht 5\' 2"  (1.575 m)  Wt 139 lb (63.05 kg)  BMI 25.42 kg/m2        Assessment & Plan:   1. Left wrist pain   2. Back pain    Meds ordered this encounter  Medications  . methylPREDNISolone acetate (DEPO-MEDROL) injection 80 mg    Sig:   . HYDROcodone-acetaminophen (NORCO/VICODIN) 5-325 MG per tablet    Sig: Take 1 tablet by mouth every 6 (six) hours as needed. For pain    Dispense:  60 tablet    Refill:  0    Order Specific Question:  Supervising Provider    Answer:  Ernestina Penna [[id number]]     Wear wrist brace that already has at home Rest Moist heat to back RTO prn  [name], FNP

## 2014-05-11 ENCOUNTER — Ambulatory Visit (INDEPENDENT_AMBULATORY_CARE_PROVIDER_SITE_OTHER): Payer: Medicare Other | Admitting: Pharmacist Clinician (PhC)/ Clinical Pharmacy Specialist

## 2014-05-11 DIAGNOSIS — I714 Abdominal aortic aneurysm, without rupture, unspecified: Secondary | ICD-10-CM

## 2014-05-11 LAB — POCT INR: INR: 3.7

## 2014-05-18 ENCOUNTER — Encounter: Payer: Self-pay | Admitting: Family

## 2014-05-19 ENCOUNTER — Encounter: Payer: Self-pay | Admitting: Family

## 2014-05-19 ENCOUNTER — Ambulatory Visit (HOSPITAL_COMMUNITY)
Admission: RE | Admit: 2014-05-19 | Discharge: 2014-05-19 | Disposition: A | Discharge status: Home / Self Care | Payer: Medicare Other | Source: Ambulatory Visit | Attending: Family | Admitting: Family

## 2014-05-19 ENCOUNTER — Ambulatory Visit (INDEPENDENT_AMBULATORY_CARE_PROVIDER_SITE_OTHER): Payer: Medicare Other | Admitting: Family

## 2014-05-19 VITALS — BP 127/65 | HR 61 | Resp 16 | Ht 65.5 in | Wt 138.0 lb

## 2014-05-19 DIAGNOSIS — I6529 Occlusion and stenosis of unspecified carotid artery: Secondary | ICD-10-CM | POA: Insufficient documentation

## 2014-05-19 DIAGNOSIS — Z48812 Encounter for surgical aftercare following surgery on the circulatory system: Secondary | ICD-10-CM

## 2014-05-19 NOTE — Progress Notes (Signed)
Established Carotid Patient   History of Present Illness  [name] is a 80 y.o. female patient of Dr. Edilia Boickson who was previously followed by Dr. Madilyn FiremanHayes. Patient underwent a left CEA and left CCA to subclavian bypass in July 2003, and a  redo of left CEA with Dr. Madilyn FiremanHayes in 2008.  Dr. Edilia Boickson performed a temporal artery biopsy for this patient more recently.  She returns today for follow up.  Patient states rare tingling in left arm, not in right arm.  Her husband died January, 2015.  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 The patient denies facial drooping.  Pt. denies hemiplegia. The patient denies receptive or expressive aphasia. Pt. denies extremity weakness.  Patient denies New Medical or Surgical History.   Pt Diabetic: No  Pt smoker: non-smoker  Pt meds include:  Statin : Yes  ASA: Yes  Other anticoagulants/antiplatelets: on coumadin since her heart surgery   Past Medical History  Diagnosis Date  . Migraines   . Lower back pain     disc   . Osteoporosis   . Hyperlipidemia   . Hypertension   . Urethral stenosis   . Aortic aneurysm   . Cardiac aneurysm     ventrral - bypass   . History of aorto-femoral bypass   . Pancreatitis   . Stricture esophagus     Hx  . GERD (gastroesophageal reflux disease)   . Carotid bruit     bil carotid bruits   . Peptic ulcer     Hx   . Gout   . H/O blood clots   . Carotid artery occlusion   . Myocardial infarction   . Stroke     Right eye stroke    Social History History  Substance Use Topics  . Smoking status: Former Smoker    Quit date: 04/06/1998  . Smokeless tobacco: Never Used  . Alcohol Use: No    Family History Family History  Problem Relation Age of Onset  . CAD Father     died age 78+  . Heart disease Father     Older than 60  . Heart attack  Father   . Osteoarthritis Sister     paraplegic due to disk rupture  . CAD Sister   . Arthritis Sister   . Colon cancer Neg Hx   . Varicose Veins Mother     Surgical History Past Surgical History  Procedure Laterality Date  . Esophageal dilation  1996  . Lumbar disc surgery  1993  . Partial hysterectomy  1966    single oopherectomy (right)   . Aortic & cardiac aneurysm rupture  04/1998  . Aorta - bilateral femoral artery bypass graft  7/99    Dr. Madilyn FiremanHayes   . Carotid endarterectomy  July 2003    Left  . Carotid endarterectomy  11/18/2007    Left  . Abdominal hysterectomy      Partial  . Coronary artery bypass graft      16 yrs ago    Allergies  Allergen Reactions  . Alendronate Sodium Other (See Comments)    Irritates esophagus and ulcers  . Buffered Aspirin Other (See Comments)    On blood thinners but doctor has started aspirin as stroke therapy  . Ibuprofen Other (See Comments)    Blood thinner therapy  . Prednisone Other (  See Comments)    Patient states due to osteoporosis  . Vioxx [Rofecoxib] Other (See Comments)    Unknown by patient    Current Outpatient Prescriptions  Medication Sig Dispense Refill  . amLODipine (NORVASC) 5 MG tablet Take 1 tablet (5 mg total) by mouth daily.  30 tablet  5  . atorvastatin (LIPITOR) 40 MG tablet Take 1 tablet (40 mg total) by mouth daily at 6 PM.  30 tablet  5  . Calcium Carbonate 1500 MG TABS Take 1 tablet by mouth every morning.      . cefdinir (OMNICEF) 300 MG capsule Take 1 capsule (300 mg total) by mouth 2 (two) times daily.  20 capsule  0  . diphenoxylate-atropine (LOMOTIL) 2.5-0.025 MG per tablet Take 1 tablet by mouth 4 (four) times daily as needed for diarrhea or loose stools.  20 tablet  0  . fish oil-omega-3 fatty acids 1000 MG capsule Take 1 g by mouth every morning.       . fluticasone (FLONASE) 50 MCG/ACT nasal spray Place 2 sprays into both nostrils daily.  16 g  2  . HYDROcodone-acetaminophen (NORCO/VICODIN) 5-325  MG per tablet Take 1 tablet by mouth every 6 (six) hours as needed. For pain  60 tablet  0  . metoprolol (LOPRESSOR) 50 MG tablet Take 1 tablet (50 mg total) by mouth 2 (two) times daily.  60 tablet  5  . triamterene-hydrochlorothiazide (MAXZIDE-25) 37.5-25 MG per tablet Take 1 tablet by mouth daily.  30 tablet  5  . warfarin (COUMADIN) 4 MG tablet Take 1 tablet (4 mg total) by mouth daily.  30 tablet  5   No current facility-administered medications for this visit.    Review of Systems : See HPI for pertinent positives and negatives.  Physical Examination   Filed Vitals:   05/19/14 1606 05/19/14 1609  BP: 115/64 127/65  Pulse: 60 61  Resp:  16  Height:  5' 5.5" (1.664 m)  Weight:  138 lb (62.596 kg)  SpO2:  97%   Body mass index is 22.61 kg/(m^2).  General: WDWN female in NAD  GAIT: normal  Eyes: Right pupil does not react to light.  Pulmonary: clear to auscultation, Negative Rales, Negative rhonchi, & Negative wheezing.  Cardiac: regular Rhythm , Positive Murmur.   VASCULAR EXAM  Carotid Bruits  Left  Right    Transmitted cardiac murmur  Transmitted cardiac murmur   Right radial pulse is not palpable, right ulnar pulse is 1+, right brachial pulse is 3+.  Left radial pulse is 2+.  LE Pulses  LEFT  RIGHT   POPLITEAL  not palpable  not palpable   POSTERIOR TIBIAL  palpable  palpable   DORSALIS PEDIS  ANTERIOR TIBIAL  not palpable  not palpable   Gastrointestinal: soft, nontender, BS WNL, no r/g, negative masses.  Musculoskeletal: Negative muscle atrophy/wasting. M/S 4/5 throughout, Extremities without ischemic changes.  Neurologic: A&O X 3; Appropriate Affect ; SENSATION ;normal;  Speech is normal  CN 2-12 intact except right pupil does not constrict to light, tongue deviates slightly to the right, Pain and light touch intact in extremities, Motor exam as listed above.    Non-Invasive Vascular Imaging CAROTID DUPLEX 05/19/2014   CEREBROVASCULAR DUPLEX EVALUATION     INDICATION: Carotid endarterectomy     PREVIOUS INTERVENTION(S): Redo of left carotid endarterectomy in 2008; Left common carotid to subclavian artery bypass graft in 2003    DUPLEX EXAM:     RIGHT  LEFT  Peak  Systolic Velocities (cm/s) End Diastolic Velocities (cm/s) Plaque LOCATION Peak Systolic Velocities (cm/s) End Diastolic Velocities (cm/s) Plaque  130 12 HT CCA PROXIMAL 228(prx.to BPG) 18 HT  46 15 HT CCA MID 99 20 HT  49 16 HT CCA DISTAL 81 18 HT  Occluded  HT ECA 70 0 HT  99 25 CP ICA PROXIMAL 54 8 CP  57 17  ICA MID 75 20   48 17  ICA DISTAL 77 17     2.02 ICA / CCA Ratio (PSV) Not Calculated  Antegrade Vertebral Flow Antegrade  102 Brachial Systolic Pressure (mmHg) 108  Multiphasic (subclavian artery) Brachial Artery Waveforms Multiphasic (subclavian artery)    Plaque Morphology:  HM = Homogeneous, HT = Heterogeneous, CP = Calcific Plaque, SP = Smooth Plaque, IP = Irregular Plaque     ADDITIONAL FINDINGS:   Known occlusion of the right proximal external carotid artery.   Patent left common carotid to subclavian artery bypass graft with velocities of greater than 300cm/s throughout(maximum of 393cm/s). Unable to adequately visualize any internal vessel narrowing of the bypass graft due to acoustic shadowing.   Velocities of the native proximal subclavian arteries are 261cm/s on the right and 272cm/s on the left.     IMPRESSION: 1. Patent left carotid endarterectomy site with Doppler velocities suggestive of less than 40% stenoses of the bilateral proximal internal carotid arteries. 2. Patent left common carotid to subclavian artery bypass graft with velocities, as described noted.     Compared to the previous exam:  No significant change noted when compared to the previous exam on 11/18/13.         Assessment: [name] is a 80 y.o. female who is s/p left CEA and left CCA to subclavian bypass in July 2003, and a  redo of left CEA with Dr. Madilyn Fireman in 2008.   Dr. Edilia Bo performed a temporal artery biopsy for this patient more recently. 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 Patent left carotid endarterectomy site with Doppler velocities suggestive of less than 40% stenoses of the bilateral proximal internal carotid arteries. Patent left common carotid to subclavian artery bypass graft with velocities, as described noted. No significant change noted when compared to the previous exam on 11/18/13.   Known occlusion of the right proximal external carotid artery.   Patent left common carotid to subclavian artery bypass graft with velocities of greater than 300cm/s throughout(maximum of 393cm/s). Unable to adequately visualize any internal vessel narrowing of the bypass graft due to acoustic shadowing.   Velocities of the native proximal subclavian arteries are 261cm/s on the right and 272cm/s on the left.   Plan: Follow-up in 6 months with Carotid Duplex scan.   I discussed in depth with the patient the nature of atherosclerosis, and emphasized the importance of maximal medical management including strict control of blood pressure, blood glucose, and lipid levels, obtaining regular exercise, and continued cessation of smoking.  The patient is aware that without maximal medical management the underlying atherosclerotic disease process will progress, limiting the benefit of any interventions. The patient was given information about stroke prevention and what symptoms should prompt the patient to seek immediate medical care. Thank you for allowing [place] to participate in this patient's care.  Charisse March, RN, MSN, FNP-C [facility] Office: [phone]  Clinic Physician: Edilia Bo  05/19/2014 4:18 PM

## 2014-05-19 NOTE — Patient Instructions (Signed)

## 2014-05-20 NOTE — Addendum Note (Signed)
Addended by: Sharee Pimple on: 05/20/2014 08:28 AM   Modules accepted: Orders

## 2014-06-01 ENCOUNTER — Ambulatory Visit (INDEPENDENT_AMBULATORY_CARE_PROVIDER_SITE_OTHER): Payer: Medicare Other | Admitting: Pharmacist Clinician (PhC)/ Clinical Pharmacy Specialist

## 2014-06-01 DIAGNOSIS — I714 Abdominal aortic aneurysm, without rupture, unspecified: Secondary | ICD-10-CM

## 2014-06-01 LAB — POCT INR: INR: 4.4

## 2014-06-08 ENCOUNTER — Ambulatory Visit (INDEPENDENT_AMBULATORY_CARE_PROVIDER_SITE_OTHER): Payer: Medicare Other | Admitting: Pharmacist Clinician (PhC)/ Clinical Pharmacy Specialist

## 2014-06-08 DIAGNOSIS — I714 Abdominal aortic aneurysm, without rupture, unspecified: Secondary | ICD-10-CM

## 2014-06-08 LAB — POCT INR: INR: 1.1

## 2014-06-08 NOTE — Patient Instructions (Signed)
B?nh ?ng mu do warfarin (Warfarin Coagulopathy) B?nh ?ng mu do warfarin (Coumadin) ?? c?p ??n ch?y mu c th? x?y ra do bi?n ch?ng c?a thu?c warfarin. Warfarin l thu?c lm long mu (thu?c ch?ng ?ng) dng theo ???ng u?ng. Warfarin ???c s? d?ng ?? ?i?u tr? cc tnh tr?ng b?nh l c?n lm long mu ?? ng?n ch?n c?c mu ?ng.  NGUYN NHN Ch?y mu l bi?n ch?ng ph? bi?n nh?t v nghim tr?ng nh?t c?a warfarin. L??ng mu ch?y lin quan ??n li?u l??ng warfarin v th?i gian ?i?u tr?Aleen Sells. Ngoi ra, bi?n ch?ng ch?y mu c th? c?ng x?y ra do:  Qu li?u warfarin do c?  ho?c v tnh.  Tnh tr?ng b?nh l ?ang c.  Thay ??i ch? ?? ?n.  T??ng tc v?i thu?c, th?o d??c, th?c ph?m ch?c n?ng ho?c r??u. TRI?U CH?NG Ch?y mu nghim tr?ng trong khi s? d?ng warfarin c th? x?y ra t? b?t k? m ho?c c? quan no. Tri?u ch?ng c?a mu do qu long c th? bao g?m:  Ch?y mu m?i ho?c l?i.  Mu trong phn c th? mu ?? t??i, s?m ho?c ?en nh? h?c n.  Mu trong n??c ti?u c th? lm cho n??c ti?u c mu h?ng, ?? ho?c nu.  B?m tm b?t th??ng ho?c b?m tm d? dng.  Ch? ??t ch?y mu khng ng?ng trong 10 pht.  Nn ra mu ho?c bu?n nn lin t?c trong h?n 1 ngy.  Ho ra mu.  M?ch mu b? v? trong m?t (xu?t huy?t d??i k?t m?c).  ?au b?ng ho?c ?au l?ng km theo ho?c khng km [employer] b?m tm ? m?ng s??n.  ?au ??u nhi?u, ??t ng?t.  ??t nhin y?u ho?c t m?t, cnh tay, chn, ??c bi?t l ? m?t bn c?a c? th?.  ??t nhin l l?n.  Kh ni (m?t ngn ng?) ho?c nh?n th?c.  ??t nhin kh nhn ? m?t ho?c c? hai m?t.  ??t nhin kh ?i b?.  Chng m?t.  M?t cn b?ng ho?c ph?i h?p.  Ch?y mu m ??o.  S?ng ho?c ?au ? ch? tim.  Ch?t m m? ? nng (ho?i t?) c th? gy ra s?o ? da. Hi?n t??ng ny ph? bi?n h?n ? ph? n? v ??u tin c th? c ?au th?t l?ng, b?p ?i ho?c mng.  S?t. H??NG D?N CH?M [place] T?I NH  Lun lin h? v?i chuyn gia ch?m [place] s?c kh?e c?a qu v? n?u c b?t k? th?c m?c ho?c d?u hi?u no c?a b?nh  ?ng mu do warfarin cng s?m cng t?t.  S? d?ng warfarin theo ?ng ch? d?n c?a chuyn gia ch?m [place] s?c kh?e. Qu v? nn s? d?ng li?u warfarin vo cng m?t th?i ?i?m trong ngy. N?u qu v? ? ???c yu c?u d?ng u?ng warfarin, khng u?ng l?i warfarin cho ??n khi chuyn gia ch?m [place] s?c kh?e ch? ??nh u?ng. Th?c hi?n theo h??ng d?n c?a chuyn gia ch?m [place] s?c kh?e n?u qu v? v tnh s? d?ng qu m?t li?u ho?c qun m?t li?u warfarin. ?i?u r?t quan tr?ng l s? d?ng warfarin theo ch? d?n v ch?y mu ho?c c?c mu ?ng c th? d?n ??n t?n th??ng, ?au ho?c tn t?t m?n tnh ho?c v?nh vi?n.  Tun th? m?i cu?c h?n khm l?i theo ch? d?n c?a chuyn gia ch?m [place] s?c kh?e. ?i?u r?t quan tr?ng l ph?i tun th? cc cu?c h?n c?a qu v?. Khng ??n ?ng h?n c th? d?n ??n t?n th??ng m?n tnh ho?c v?nh vi?n, ?  au ho?c tn t?t v warfarin l m?t lo?i thu?c ?i h?i ph?i gim st ch?t ch?.  Trong khi dng warfarin, qu v? s? c?n xt nghi?m mu th??ng xuyn ?? xc ??nh th?i gian mu ?ng mu. Cc xt nghi?m mu ny th??ng bao g?m c? xt nghi?m th?i gian prothrombin (PT) v xt nghi?m T? l? chu?n ha qu?c t? (INR). K?t qu? PT v INR cho php chuyn gia ch?m [place] s?c kh? ?i?u ch?nh li?u warfarin c?a qu v?. Li?u l??ng c th? thay ??i v nhi?u l do. ?i?u ??c bi?t quan tr?ng l qu v? c m?c PT v INR ???c l?y ra chnh xc theo ch? d?n. Li?u warfarin c?a qu v? c th? ???c gi? nguyn ho?c thay ??i ty thu?c vo k?t qu? INR v PT. ??m b?o vi?c qu v? g?p chuyn gia ch?m [place] s?c kh?e ?? bn b?c v? k?t qu? xt nghi?m PT v INR c?ng nh? li?u l??ng warfarin c?a qu v?.  Nhi?u lo?i thu?c c th? ?nh h??ng ??n warfarin v ?nh h??ng ??n k?t qu? INR v PT. Qu v? ph?i ni cho chuyn gia ch?m [place] s?c kh?e c?a mnh bi?t b?t k? v t?t c? cc lo?i thu?c qu v? dng, vi?c ny bao g?m t?t c? cc lo?i vitamin v th?c ph?m ch?c n?ng. H?i chuyn gia ch?m [place] s?c kh?e tr??c khi dng nh?ng thu?c ny. Tnh nh?t qun c?a thu?c theo k ??n v thu?c khng c?n k ??n  l r?t quan tr?ng trong vi?c qu?n l warfarin. ?i?u quan tr?ng l ph?i ki?m tra cc t??ng tc thu?c c th? c tr??c khi b?t ??u dng m?t lo?i thu?c m?i. Hy ??c bi?t th?n tr?ng v?i aspirin v thu?c ch?ng vim. Hy h?i chuyn gia ch?m [place] s?c kh?e tr??c khi dng nh?ng thu?c ny. M?t s? lo?i thu?c nh? thu?c khng sinh v thu?c lm gi?m n?ng ?? axit c th? t??ng tc v?i warfarin v c th? gy ra t?ng tc d?ng c?a warfarin. Warfarin c?ng c th? ?nh h??ng ??n tnh hi?u qu? c?a cc lo?i thu?c m qu v? ?ang dng. Khng s? d?ng ho?c ng?ng s? d?ng b?t k? lo?i thu?c c?n k ??n ho?c thu?c khng c?n k ??n no, ngo?i tr? theo l?i khuyn c?a chuyn gia ch?m [place] s?c kh?e ho?c d??c s? c?a qu v?.  M?t s? vitamin, th?c ph?m ch?c n?ng v cc s?n ph?m th?o d??c gy ?nh h??ng ??n tnh hi?u qu? c?a warfarin. Vitamin E c th? lm t?ng tc d?ng ch?ng ?ng c?a warfarin. Vitamin K c th? c th? lm cho warfarin km hi?u qu? h?n. Khng s? d?ng ho?c ng?ng s? d?ng b?t k? lo?i vitamin, th?c ph?m ch?c n?ng ho?c s?n ph?m th?o d??c no ngo?i tr? theo l?i khuyn c?a chuyn gia ch?m [place] y t? ho?c d??c s? c?a qu v?.  ?n nh?ng g qu v? th??ng ?n v gi? hm l??ng vitamin K trong ch? ?? ?n u?ng ?n ??nh. Trnh nh?ng thay ??i l?n trong ch? ?? ?n u?ng, ho?c thng bo cho chuyn gia ch?m  s?c kh?e tr??c khi thay ??i ch? ?? ?n u?ng. B?t ng? h?p th? qu nhi?u vitamin K c th? lm cho mu ?ng qu nhanh. B?t ng? gi?m l??ng vitamin K h?p th? vo c? th? c th? lm mu c?a qu v? ?ng qu ch?m. Nh?ng l?n thay ??i l??ng dng vitamin K ny c th? d?n ??n hnh thnh c?c mu ?ng nguy hi?mho?c ch?y mu. ?? gi? cho l??ng dng vitamin  K c? ??nh, qu v? ph?i bi?t lo?i th?c ?n no c hm l??ng vitamin K cao ho?c v?a ph?i. M?t s? lo?i th?c ?n giu vitamin K bao g?m rau bina, c?i xo?n, bng c?i xanh, c?i b?p, rau xanh, c?i [place], m?ng ty, c?i b xi, x lch tr?n, mi ty, v tr xanh. S?p x?p ??n g?p chuyn gia dinh d??ng ?? ???c tr? l?i v? cc cu h?i c?a  qu v?.  N?u qu v? m?t c?m gic ngon mi?ng ho?c b? cm d? dy (vim d? dy ru?t do vi rt), hy ni chuy?n v?i chuyn gia ch?m [place] s?c kh?e cng s?m cng t?t. Gi?m l??ng dng vitamin K bnh th??ng qu v? dng c th? lm cho qu v? nh?y c?m h?n v?i li?u warfarin thng th??ng c?a mnh.  M?t s? tnh tr?ng b?nh l c th? lm t?ng nguy c? ch?y mu trong khi s? d?ng warfarin. S?t, tiu ch?y ko di h?n m?t ngy, suy tim ngy m?t x?u ?i, ho?c ch?c n?ng gan x?u ?i l m?t s? tnh tr?ng b?nh l c th? ?nh h??ng ??n warfarin. Lin h? v?i chuyn gia ch?m [place] s?c kh?e n?u qu v? c b?t k? tnh tr?ng b?nh l no trong s? ny.  Hy c?n th?n khng t? c?t vo chnh mnh khi s? d?ng cc v?t s?c nh?n ho?c khi c?o ru.  R??u c th? thay ??i kh? n?ng x? l warfarin c?a c? th?. T?t nh?t l nn trnh ?? u?ng c c?n ho?c ch? dng m?t l??ng r?t nh? trong khi dng warfarin. Thng bo cho chuyn gia ch?m [place] s?c kh?e bi?t n?u qu v? thay ??i l??ng r??u u?ng vo. T?ng s? d?ng r??u ??t ng?t c th? lm t?ng nguy c? ch?y mu. S? d?ng r??u ko di c th? lm cho warfarin km hi?u qu? h?n.  H?n ch? cc ho?t ??ng thn th? ho?c th? thao c th? d?n ??n ng ho?c gy ra th??ng tch.  Khng s? d?ng warfarin n?u qu v? ?ang mang [place].  Thng bo cho t?t c? cc chuyn gia ch?m [place] s?c kh?e v nha s? c?a qu v? bi?t r?ng qu v? s? d?ng warfarin.  Thng bo cho t?t c? cc chuyn gia ch?m [place] s?c kh?e n?u qu v? ?ang dng warfarin v aspirin ho?c thu?c ?c ch? ti?u c?u nh? clopidogrel, ticagrelor ho?c prasugrel. S? d?ng cc lo?i thu?c cng v?i warfarin c th? lm t?ng nguy c? ch?y mu ho?c t? vong. Ch? nn s? d?ng cc lo?i thu?c cng nhau khi c theo di tr?c ti?p c?a chuyn gia ch?m [place] s?c kh?e. NGAY L?P T?C ?I KHM N?U:  Qu v? ho ra mu.  Qu v? ?i ngoi phn s?m mu ho?c mu ?en ho?c ch?y mu mu ?? t??i ? tr?c trng.  Qu v? nn ra mu ho?c bu?n nn trong h?n 1 ngy.  Qu v? ?i ti?u ti?n ra mu ho?c n??c ti?u c mu h?ng.  Qu  v? b? b?m tm b?t th??ng ho?c b?m tm gia t?ng.  Qu v? b? ch?y mu m?i ho?c ch?y mu l?i khng nhanh chng ng?ng l?i.  Qu v? b? v?t ??t ch?y mu khng ng?ng trong 2-3 pht.  ??t nhin qu v? c?m th?y y?u ho?c t m?t, tay, chn, ??c bi?t l ? m?t bn c?a c? th?.  ??t nhin qu v? b? l l?n.  Qu v? kh ni (m?t ngn ng?) ho?c nh?n th?c.  ??t nhin qu v? kh nhn ? m?t ho?c c?  hai m?t.  ??t nhin qu v? kh ?i b?.  Qu v? b? hoa m?t.  Qu v? b? m?t cn b?ng ho?c s? ph?i h?p.  Qu v? b? ?au ??u nhi?u, ??t ng?t.  Ng ho?c ch?n th??ng ??u nghim tr?ng, ngay c? khi qu v? khng ch?y mu.  Qu v? b? s?ng ho?c ?au ? ch? tim.  Qu v? b? nh?y c?m ?au ho?c ?au khng r nguyn nhn ? b?ng, l?ng, th?t l?ng, ?i ho?c mng.  Qu v? b? s?t. B?t c? tri?u ch?ng no trong s? ny c th? l m?t v?n ?? nghim tr?ng c?n c?p c?u. Khng ch? xem tri?u ch?ng c h?t khng. Hy ?i khm ngay l?p t?c. G?i cho d?ch v? c?p c?u t?i ??a ph??ng (911 ? [place]?). Khng t? li xe ??n b?nh vi?n. Document Released: 02/20/2010 Document Revised: 12/01/2013 [facility] Patient Information 2015 [place], [place]. This information is not intended to replace advice given to you by your health care Kemara Quigley. Make sure you discuss any questions you have with your health care Vy Badley.

## 2014-06-15 ENCOUNTER — Ambulatory Visit (INDEPENDENT_AMBULATORY_CARE_PROVIDER_SITE_OTHER): Payer: Medicare Other | Admitting: Pharmacist Clinician (PhC)/ Clinical Pharmacy Specialist

## 2014-06-15 DIAGNOSIS — I714 Abdominal aortic aneurysm, without rupture, unspecified: Secondary | ICD-10-CM | POA: Diagnosis not present

## 2014-06-15 LAB — POCT INR: INR: 2.7

## 2014-06-15 NOTE — Patient Instructions (Signed)
Place anticoagulation patient instructions here.  

## 2014-06-28 ENCOUNTER — Other Ambulatory Visit: Payer: Self-pay | Admitting: Nurse Practitioner

## 2014-07-06 ENCOUNTER — Ambulatory Visit (INDEPENDENT_AMBULATORY_CARE_PROVIDER_SITE_OTHER): Payer: Medicare Other | Admitting: Pharmacist Clinician (PhC)/ Clinical Pharmacy Specialist

## 2014-07-06 DIAGNOSIS — I714 Abdominal aortic aneurysm, without rupture, unspecified: Secondary | ICD-10-CM | POA: Diagnosis not present

## 2014-07-06 LAB — POCT INR: INR: 2.2

## 2014-08-17 ENCOUNTER — Ambulatory Visit (INDEPENDENT_AMBULATORY_CARE_PROVIDER_SITE_OTHER): Payer: Medicare Other | Admitting: Pharmacist Clinician (PhC)/ Clinical Pharmacy Specialist

## 2014-08-17 DIAGNOSIS — I714 Abdominal aortic aneurysm, without rupture, unspecified: Secondary | ICD-10-CM | POA: Diagnosis not present

## 2014-08-17 DIAGNOSIS — Z79899 Other long term (current) drug therapy: Secondary | ICD-10-CM

## 2014-08-17 DIAGNOSIS — R634 Abnormal weight loss: Secondary | ICD-10-CM | POA: Diagnosis not present

## 2014-08-17 LAB — POCT INR: INR: 2.2

## 2014-08-22 DIAGNOSIS — Z23 Encounter for immunization: Secondary | ICD-10-CM | POA: Diagnosis not present

## 2014-09-14 DIAGNOSIS — I2510 Atherosclerotic heart disease of native coronary artery without angina pectoris: Secondary | ICD-10-CM | POA: Diagnosis not present

## 2014-09-14 DIAGNOSIS — I119 Hypertensive heart disease without heart failure: Secondary | ICD-10-CM | POA: Diagnosis not present

## 2014-09-14 DIAGNOSIS — I739 Peripheral vascular disease, unspecified: Secondary | ICD-10-CM | POA: Diagnosis not present

## 2014-09-23 ENCOUNTER — Other Ambulatory Visit: Payer: Self-pay | Admitting: Nurse Practitioner

## 2014-09-28 ENCOUNTER — Ambulatory Visit (INDEPENDENT_AMBULATORY_CARE_PROVIDER_SITE_OTHER): Payer: Medicare Other | Admitting: Pharmacist Clinician (PhC)/ Clinical Pharmacy Specialist

## 2014-09-28 ENCOUNTER — Telehealth: Payer: Self-pay | Admitting: Family Medicine

## 2014-09-28 DIAGNOSIS — I714 Abdominal aortic aneurysm, without rupture, unspecified: Secondary | ICD-10-CM

## 2014-09-28 LAB — POCT INR: INR: 2.6

## 2014-09-28 NOTE — Telephone Encounter (Signed)
Appt given per patients request 

## 2014-10-07 ENCOUNTER — Encounter: Payer: Self-pay | Admitting: Nurse Practitioner

## 2014-10-07 ENCOUNTER — Ambulatory Visit (INDEPENDENT_AMBULATORY_CARE_PROVIDER_SITE_OTHER): Payer: Medicare Other | Admitting: Nurse Practitioner

## 2014-10-07 ENCOUNTER — Other Ambulatory Visit: Payer: Self-pay | Admitting: Nurse Practitioner

## 2014-10-07 VITALS — BP 141/66 | HR 69 | Temp 98.2°F | Ht 64.0 in | Wt 144.0 lb

## 2014-10-07 DIAGNOSIS — I2510 Atherosclerotic heart disease of native coronary artery without angina pectoris: Secondary | ICD-10-CM | POA: Diagnosis not present

## 2014-10-07 DIAGNOSIS — I739 Peripheral vascular disease, unspecified: Secondary | ICD-10-CM | POA: Diagnosis not present

## 2014-10-07 DIAGNOSIS — I10 Essential (primary) hypertension: Secondary | ICD-10-CM | POA: Diagnosis not present

## 2014-10-07 DIAGNOSIS — I714 Abdominal aortic aneurysm, without rupture, unspecified: Secondary | ICD-10-CM

## 2014-10-07 DIAGNOSIS — K222 Esophageal obstruction: Secondary | ICD-10-CM

## 2014-10-07 DIAGNOSIS — E785 Hyperlipidemia, unspecified: Secondary | ICD-10-CM

## 2014-10-07 DIAGNOSIS — Z7901 Long term (current) use of anticoagulants: Secondary | ICD-10-CM

## 2014-10-07 DIAGNOSIS — J410 Simple chronic bronchitis: Secondary | ICD-10-CM

## 2014-10-07 DIAGNOSIS — I679 Cerebrovascular disease, unspecified: Secondary | ICD-10-CM

## 2014-10-07 DIAGNOSIS — I6529 Occlusion and stenosis of unspecified carotid artery: Secondary | ICD-10-CM | POA: Diagnosis not present

## 2014-10-07 DIAGNOSIS — K219 Gastro-esophageal reflux disease without esophagitis: Secondary | ICD-10-CM

## 2014-10-07 MED ORDER — HYDROCODONE-ACETAMINOPHEN 5-325 MG PO TABS: 1.0000 | ORAL_TABLET | Freq: Four times a day (QID) | ORAL | Status: DC | PRN

## 2014-10-07 MED ORDER — WARFARIN SODIUM 4 MG PO TABS: 4.0000 mg | ORAL_TABLET | Freq: Every day | ORAL | Status: DC

## 2014-10-07 MED ORDER — ATORVASTATIN CALCIUM 40 MG PO TABS: ORAL_TABLET | ORAL | Status: DC

## 2014-10-07 MED ORDER — AMLODIPINE BESYLATE 5 MG PO TABS: 5.0000 mg | ORAL_TABLET | Freq: Every day | ORAL | Status: DC

## 2014-10-07 MED ORDER — METOPROLOL TARTRATE 50 MG PO TABS: ORAL_TABLET | ORAL | Status: DC

## 2014-10-07 MED ORDER — TRIAMTERENE-HCTZ 37.5-25 MG PO TABS: 1.0000 | ORAL_TABLET | Freq: Every day | ORAL | Status: DC

## 2014-10-07 NOTE — Patient Instructions (Signed)

## 2014-10-07 NOTE — Progress Notes (Signed)
Subjective:    Patient ID: Maria ClementMargaret [name], female    DOB: 11-19-1932, 80 y.o.   MRN: [id number]  HPI   Patient here today for follow - up of chronic medical problems- She says she is doing well and has no complaints today: Patient is still very nervous since passing of her husband and will not stay in her house by herself. Patient Active Problem List   Diagnosis Date Noted  . AAA (abdominal aortic aneurysm) without rupture 03/24/2013  . Chronic anticoagulation 08/07/2011  . Cerebrovascular disease 08/07/2011  . CAD (coronary artery disease) 08/07/2011  . PVD (peripheral vascular disease) 08/07/2011  . Benign hypertension 08/07/2011  . Hyperlipidemia 08/07/2011  . Status post dilatation of esophageal stricture 08/07/2011  . Ventricular aneurysm 08/07/2011  . GERD with stricture 08/07/2011  . COPD (chronic obstructive pulmonary disease) 08/07/2011   Outpatient Encounter Prescriptions as of 10/07/2014  Medication Sig  . amLODipine (NORVASC) 5 MG tablet Take 1 tablet (5 mg total) by mouth daily.  [name]. atorvastatin (LIPITOR) 40 MG tablet TAKE ONE TABLET BY MOUTH ONE TIME DAILY  . Calcium Carbonate 1500 MG TABS Take 1 tablet by mouth every morning.  . fish oil-omega-3 fatty acids 1000 MG capsule Take 1 g by mouth every morning.   . metoprolol (LOPRESSOR) 50 MG tablet TAKE ONE TABLET BY MOUTH TWICE DAILY  . triamterene-hydrochlorothiazide (MAXZIDE-25) 37.5-25 MG per tablet Take 1 tablet by mouth daily.  [name]. warfarin (COUMADIN) 4 MG tablet Take 1 tablet (4 mg total) by mouth daily.  . [DISCONTINUED] cefdinir (OMNICEF) 300 MG capsule Take 1 capsule (300 mg total) by mouth 2 (two) times daily.  . [DISCONTINUED] diphenoxylate-atropine (LOMOTIL) 2.5-0.025 MG per tablet Take 1 tablet by mouth 4 (four) times daily as needed for diarrhea or loose stools.  . [DISCONTINUED] fluticasone (FLONASE) 50 MCG/ACT nasal spray Place 2 sprays into both nostrils daily.  . [DISCONTINUED] HYDROcodone-acetaminophen  (NORCO/VICODIN) 5-325 MG per tablet Take 1 tablet by mouth every 6 (six) hours as needed. For pain       Review of Systems  Constitutional: Negative.   HENT: Negative.   Eyes: Negative.   Respiratory: Negative.   Cardiovascular: Negative.   Gastrointestinal: Negative.   Musculoskeletal: Positive for arthralgias.  Skin: Negative.   Neurological: Negative.   Hematological: Negative.   Psychiatric/Behavioral: Negative.        Objective:   Physical Exam  Constitutional: She is oriented to person, place, and time. She appears well-developed and well-nourished.  HENT:  Nose: Nose normal.  Mouth/Throat: Oropharynx is clear and moist.  Eyes: EOM are normal.  Neck: Trachea normal, normal range of motion and full passive range of motion without pain. Neck supple. No JVD present. Carotid bruit is not present. No thyromegaly present.  Cardiovascular: Normal rate, regular rhythm, normal heart sounds and intact distal pulses.  Exam reveals no gallop and no friction rub.   No murmur heard. Pulmonary/Chest: Effort normal and breath sounds normal.  Abdominal: Soft. Bowel sounds are normal. She exhibits no distension and no mass. There is no tenderness.  Musculoskeletal: Normal range of motion.  Lymphadenopathy:    She has no cervical adenopathy.  Neurological: She is alert and oriented to person, place, and time. She has normal reflexes.  Skin: Skin is warm and dry.  Psychiatric: She has a normal mood and affect. Her behavior is normal. Judgment and thought content normal.    BP 141/66  Pulse 69  Temp(Src) 98.2 F (36.8 C) (Oral)  Ht 5\' 4"  (  1.626 m)  Wt 144 lb (65.318 kg)  BMI 24.71 kg/m2       Assessment & Plan:   1. Coronary artery disease involving native coronary artery of native heart without angina pectoris   2. Cerebrovascular disease   3. PVD (peripheral vascular disease)   4. Benign hypertension   5. AAA (abdominal aortic aneurysm) without rupture   6. Simple  chronic bronchitis   7. GERD with stricture   8. Hyperlipidemia   9. Chronic anticoagulation    Meds ordered this encounter  Medications  . triamterene-hydrochlorothiazide (MAXZIDE-25) 37.5-25 MG per tablet    Sig: Take 1 tablet by mouth daily.    Dispense:  30 tablet    Refill:  5    Order Specific Question:  Supervising Provider    Answer:  Ernestina PennaMOORE, DONALD W [[id number]]  . warfarin (COUMADIN) 4 MG tablet    Sig: Take 1 tablet (4 mg total) by mouth daily.    Dispense:  30 tablet    Refill:  5    Order Specific Question:  Supervising Provider    Answer:  Ernestina PennaMOORE, DONALD W [[id number]]  . metoprolol (LOPRESSOR) 50 MG tablet    Sig: TAKE ONE TABLET BY MOUTH TWICE DAILY    Dispense:  60 tablet    Refill:  5    Order Specific Question:  Supervising Provider    Answer:  Ernestina PennaMOORE, DONALD W [[id number]]  . atorvastatin (LIPITOR) 40 MG tablet    Sig: TAKE ONE TABLET BY MOUTH ONE TIME DAILY    Dispense:  30 tablet    Refill:  0    Order Specific Question:  Supervising Provider    Answer:  Ernestina PennaMOORE, DONALD W [[id number]]  . amLODipine (NORVASC) 5 MG tablet    Sig: Take 1 tablet (5 mg total) by mouth daily.    Dispense:  30 tablet    Refill:  5    Order Specific Question:  Supervising Provider    Answer:  Ernestina PennaMOORE, DONALD W [[id number]]  . HYDROcodone-acetaminophen (NORCO/VICODIN) 5-325 MG per tablet    Sig: Take 1 tablet by mouth every 6 (six) hours as needed. For pain    Dispense:  60 tablet    Refill:  0    Order Specific Question:  Supervising Provider    Answer:  Ernestina PennaMOORE, DONALD W [phone][[id number]]

## 2014-10-08 LAB — CMP14+EGFR
ALT: 19 IU/L (ref 0–32)
AST: 25 IU/L (ref 0–40)
Albumin/Globulin Ratio: 2 (ref 1.1–2.5)
Albumin: 4.3 g/dL (ref 3.5–4.7)
Alkaline Phosphatase: 86 IU/L (ref 39–117)
BUN/Creatinine Ratio: 13 (ref 11–26)
BUN: 17 mg/dL (ref 8–27)
CO2: 21 mmol/L (ref 18–29)
Calcium: 9.5 mg/dL (ref 8.7–10.3)
Chloride: 104 mmol/L (ref 97–108)
Creatinine, Ser: 1.3 mg/dL — ABNORMAL HIGH (ref 0.57–1.00)
GFR calc Af Amer: 45 mL/min/1.73 — ABNORMAL LOW
GFR calc non Af Amer: 39 mL/min/1.73 — ABNORMAL LOW
Globulin, Total: 2.2 g/dL (ref 1.5–4.5)
Glucose: 97 mg/dL (ref 65–99)
Potassium: 4.3 mmol/L (ref 3.5–5.2)
Sodium: 139 mmol/L (ref 134–144)
Total Bilirubin: 1 mg/dL (ref 0.0–1.2)
Total Protein: 6.5 g/dL (ref 6.0–8.5)

## 2014-10-08 LAB — NMR, LIPOPROFILE
Cholesterol: 163 mg/dL (ref 100–199)
HDL Cholesterol by NMR: 43 mg/dL (ref >39)
HDL Particle Number: 34.3 umol/L (ref >=30.5)
LDL PARTICLE NUMBER: 965 nmol/L (ref <1000)
LDL Size: 19.9 nm (ref >20.5)
LDL-C: 77 mg/dL (ref 0–99)
LP-IR Score: 43 (ref <=45)
Small LDL Particle Number: 708 nmol/L — ABNORMAL HIGH (ref <=527)
TRIGLYCERIDES BY NMR: 217 mg/dL — AB (ref 0–149)

## 2014-11-14 ENCOUNTER — Other Ambulatory Visit: Payer: Self-pay | Admitting: Nurse Practitioner

## 2014-11-16 ENCOUNTER — Ambulatory Visit (INDEPENDENT_AMBULATORY_CARE_PROVIDER_SITE_OTHER): Payer: Medicare Other | Admitting: Pharmacist Clinician (PhC)/ Clinical Pharmacy Specialist

## 2014-11-16 DIAGNOSIS — I714 Abdominal aortic aneurysm, without rupture, unspecified: Secondary | ICD-10-CM

## 2014-11-16 LAB — POCT INR: INR: 2.9

## 2014-11-18 ENCOUNTER — Encounter (HOSPITAL_COMMUNITY): Payer: Self-pay | Admitting: Vascular Surgery

## 2014-11-23 ENCOUNTER — Encounter: Payer: Self-pay | Admitting: Family

## 2014-11-24 ENCOUNTER — Ambulatory Visit (INDEPENDENT_AMBULATORY_CARE_PROVIDER_SITE_OTHER): Payer: Medicare Other | Admitting: Family

## 2014-11-24 ENCOUNTER — Ambulatory Visit (HOSPITAL_COMMUNITY)
Admission: RE | Admit: 2014-11-24 | Discharge: 2014-11-24 | Disposition: A | Discharge status: Home / Self Care | Payer: Medicare Other | Source: Ambulatory Visit | Attending: Family | Admitting: Family

## 2014-11-24 ENCOUNTER — Encounter: Payer: Self-pay | Admitting: Family

## 2014-11-24 VITALS — BP 140/73 | HR 67 | Resp 16 | Ht 65.0 in | Wt 142.0 lb

## 2014-11-24 DIAGNOSIS — I6523 Occlusion and stenosis of bilateral carotid arteries: Secondary | ICD-10-CM | POA: Diagnosis not present

## 2014-11-24 DIAGNOSIS — I6529 Occlusion and stenosis of unspecified carotid artery: Secondary | ICD-10-CM

## 2014-11-24 DIAGNOSIS — Z48812 Encounter for surgical aftercare following surgery on the circulatory system: Secondary | ICD-10-CM

## 2014-11-24 NOTE — Patient Instructions (Signed)
Stroke Prevention Some medical conditions and behaviors are associated with an increased chance of having a stroke. You may prevent a stroke by making healthy choices and managing medical conditions. HOW CAN I REDUCE MY RISK OF HAVING A STROKE?   Stay physically active. Get at least 30 minutes of activity on most or all days.  Do not smoke. It may also be helpful to avoid exposure to secondhand smoke.  Limit alcohol use. Moderate alcohol use is considered to be:  No more than 2 drinks per day for men.  No more than 1 drink per day for nonpregnant women.  Eat healthy foods. This involves:  Eating 5 or more servings of fruits and vegetables a day.  Making dietary changes that address high blood pressure (hypertension), high cholesterol, diabetes, or obesity.  Manage your cholesterol levels.  Making food choices that are high in fiber and low in saturated fat, trans fat, and cholesterol may control cholesterol levels.  Take any prescribed medicines to control cholesterol as directed by your health care provider.  Manage your diabetes.  Controlling your carbohydrate and sugar intake is recommended to manage diabetes.  Take any prescribed medicines to control diabetes as directed by your health care provider.  Control your hypertension.  Making food choices that are low in salt (sodium), saturated fat, trans fat, and cholesterol is recommended to manage hypertension.  Take any prescribed medicines to control hypertension as directed by your health care provider.  Maintain a healthy weight.  Reducing calorie intake and making food choices that are low in sodium, saturated fat, trans fat, and cholesterol are recommended to manage weight.  Stop drug abuse.  Avoid taking birth control pills.  Talk to your health care provider about the risks of taking birth control pills if you are over 35 years old, smoke, get migraines, or have ever had a blood clot.  Get evaluated for sleep  disorders (sleep apnea).  Talk to your health care provider about getting a sleep evaluation if you snore a lot or have excessive sleepiness.  Take medicines only as directed by your health care provider.  For some people, aspirin or blood thinners (anticoagulants) are helpful in reducing the risk of forming abnormal blood clots that can lead to stroke. If you have the irregular heart rhythm of atrial fibrillation, you should be on a blood thinner unless there is a good reason you cannot take them.  Understand all your medicine instructions.  Make sure that other conditions (such as anemia or atherosclerosis) are addressed. SEEK IMMEDIATE MEDICAL CARE IF:   You have sudden weakness or numbness of the face, arm, or leg, especially on one side of the body.  Your face or eyelid droops to one side.  You have sudden confusion.  You have trouble speaking (aphasia) or understanding.  You have sudden trouble seeing in one or both eyes.  You have sudden trouble walking.  You have dizziness.  You have a loss of balance or coordination.  You have a sudden, severe headache with no known cause.  You have new chest pain or an irregular heartbeat. Any of these symptoms may represent a serious problem that is an emergency. Do not wait to see if the symptoms will go away. Get medical help at once. Call your local emergency services (911 in [place] Do not drive yourself to the hospital. Document Released: 01/03/2005 Document Revised: 04/12/2014 Document Reviewed: 05/29/2013 [employer] Patient Information 2015 [employer]. This information is not intended to replace advice given   to you by your health care provider. Make sure you discuss any questions you have with your health care provider.  

## 2014-11-24 NOTE — Progress Notes (Signed)
Established Carotid Patient   History of Present Illness  [name] is a 80 y.o. female patient of Dr. Edilia Boickson who was previously followed by Dr. Madilyn FiremanHayes. Patient underwent a left CEA and left CCA to subclavian bypass in July 2003, and a redo of left CEA with Dr. Madilyn FiremanHayes in 2008.  Dr. Edilia Boickson performed a temporal artery biopsy for this patient more recently.  She returns today for follow up.  Patient states rare tingling in left arm, not in right arm.  Her husband died January, 2015.  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 The patient denies facial drooping.  Pt. denies hemiplegia. The patient denies receptive or expressive aphasia.   Pt states her legs feels weak of she walks much; noted she has lumbar spine issues, pedal pulses are palpable. Patient denies New Medical or Surgical History.   Pt Diabetic: No  Pt smoker:  Former smoker, quit in 1999 Pt meds include:  Statin : Yes  ASA: no  Other anticoagulants/antiplatelets: on coumadin since her heart surgery    Past Medical History  Diagnosis Date  . Migraines   . Lower back pain     disc   . Osteoporosis   . Hyperlipidemia   . Hypertension   . Urethral stenosis   . Aortic aneurysm   . Cardiac aneurysm     ventrral - bypass   . History of aorto-femoral bypass   . Pancreatitis   . Stricture esophagus     Hx  . GERD (gastroesophageal reflux disease)   . Carotid bruit     bil carotid bruits   . Peptic ulcer     Hx   . Gout   . H/O blood clots   . Carotid artery occlusion   . Myocardial infarction   . Stroke     Right eye stroke    Social History History  Substance Use Topics  . Smoking status: Former Smoker    Quit date: 04/06/1998  . Smokeless tobacco: Never Used  . Alcohol Use: No    Family  History Family History  Problem Relation Age of Onset  . CAD Father     died age 78+  . Heart disease Father     Older than 60  . Heart attack Father   . Osteoarthritis Sister     paraplegic due to disk rupture  . CAD Sister   . Arthritis Sister   . Colon cancer Neg Hx   . Varicose Veins Mother     Surgical History Past Surgical History  Procedure Laterality Date  . Esophageal dilation  1996  . Lumbar disc surgery  1993  . Partial hysterectomy  1966    single oopherectomy (right)   . Aortic & cardiac aneurysm rupture  04/1998  . Aorta - bilateral femoral artery bypass graft  7/99    Dr. Madilyn FiremanHayes   . Carotid endarterectomy  July 2003    Left  . Carotid endarterectomy  11/18/2007    Left  . Abdominal hysterectomy      Partial  . Coronary artery bypass graft      16 yrs ago  . Arch aortogram N/A 11/17/2012    Procedure: ARCH AORTOGRAM;  Surgeon: Chuck Hinthristopher S Dickson, MD;  Location: [facility] CATH [facility];  Service: Cardiovascular;  Laterality: N/A;  Allergies  Allergen Reactions  . Alendronate Sodium Other (See Comments)    Irritates esophagus and ulcers  . Buffered Aspirin Other (See Comments)    On blood thinners but doctor has started aspirin as stroke therapy  . Ibuprofen Other (See Comments)    Blood thinner therapy  . Prednisone Other (See Comments)    Patient states due to osteoporosis  . Vioxx [Rofecoxib] Other (See Comments)    Unknown by patient    Current Outpatient Prescriptions  Medication Sig Dispense Refill  . amLODipine (NORVASC) 5 MG tablet Take 1 tablet (5 mg total) by mouth daily. 30 tablet 5  . atorvastatin (LIPITOR) 40 MG tablet TAKE ONE TABLET BY MOUTH ONE TIME DAILY 30 tablet 3  . Calcium Carbonate 1500 MG TABS Take 1 tablet by mouth every morning.    . fish oil-omega-3 fatty acids 1000 MG capsule Take 1 g by mouth every morning.     [name] HYDROcodone-acetaminophen (NORCO/VICODIN) 5-325 MG per tablet Take 1 tablet by mouth every 6 (six) hours as needed. For  pain 60 tablet 0  . metoprolol (LOPRESSOR) 50 MG tablet TAKE ONE TABLET BY MOUTH TWICE DAILY 60 tablet 5  . triamterene-hydrochlorothiazide (MAXZIDE-25) 37.5-25 MG per tablet Take 1 tablet by mouth daily. 30 tablet 5  . warfarin (COUMADIN) 4 MG tablet TAKE ONE TABLET BY MOUTH EVERY DAY AS DIRECTED 30 tablet 2   No current facility-administered medications for this visit.    Review of Systems : See HPI for pertinent positives and negatives.  Physical Examination  Filed Vitals:   11/24/14 1141 11/24/14 1147  BP: 123/66 140/73  Pulse: 65 67  Resp:  16  Height:  5\' 5"  (1.651 m)  Weight:  142 lb (64.411 kg)  SpO2:  98%   Body mass index is 23.63 kg/(m^2).  General: WDWN female in NAD  GAIT: normal  Eyes: Right pupil does not react to light.  Pulmonary: clear to auscultation, Negative Rales, Negative rhonchi, & Negative wheezing.  Cardiac: regular Rhythm , Positive Murmur.   VASCULAR EXAM  Carotid Bruits  Left  Right    Transmitted cardiac murmur  Transmitted cardiac murmur   Right radial pulse is not palpable, right brachial pulse is 2+.  Left radial pulse is 2+.   LE Pulses  LEFT  RIGHT   FEMORAL 2+ palpable 2+ palpable  POPLITEAL  not palpable  not palpable   POSTERIOR TIBIAL  2+palpable  2+palpable   DORSALIS PEDIS  ANTERIOR TIBIAL  faintly palpable  1+ palpable    Gastrointestinal: soft, nontender, BS WNL, no r/g, no palpable masses.  Musculoskeletal: Negative muscle atrophy/wasting. M/S 4/5 throughout, Extremities without ischemic changes.  Neurologic: A&O X 3; Appropriate Affect ; SENSATION ;normal;  Speech is normal  CN 2-12 intact except right pupil does not constrict to light, tongue deviates slightly to the right, Pain and light touch intact in extremities, Motor exam as listed above.   Non-Invasive Vascular Imaging CAROTID DUPLEX 11/24/2014   CEREBROVASCULAR DUPLEX EVALUATION    INDICATION: Carotid artery disease    PREVIOUS  INTERVENTION(S): Left carotid endarterectomy with redo, left common carotid to subclavian artery bypass 2003    DUPLEX EXAM: Carotid duplex    RIGHT  LEFT  Peak Systolic Velocities (cm/s) End Diastolic Velocities (cm/s) Plaque LOCATION Peak Systolic Velocities (cm/s) End Diastolic Velocities (cm/s) Plaque  63 11 HT CCA PROXIMAL 77 14 HT  42 14 HT CCA MID 77 14 HT  51 17 HT CCA DISTAL 83 12  HT  N/V - - ECA 79 12 HT  80 24 CP ICA PROXIMAL 90 21 HT  70 22 - ICA MID 96 24 -  49 17 - ICA DISTAL 95 23 -    1.9 ICA / CCA Ratio (PSV) N/A  Antegrade Vertebral Flow Antegrade  140 Brachial Systolic Pressure (mmHg) 148  Triphasic Brachial Artery Waveforms Triphasic    Plaque Morphology:  HM = Homogeneous, HT = Heterogeneous, CP = Calcific Plaque, SP = Smooth Plaque, IP = Irregular Plaque     ADDITIONAL FINDINGS: Left cca / subclavian anastomosis 295 cms, distal graft 270 cms    IMPRESSION: 1. Less than 40% right internal carotid artery stenosis, calcific plaque may obscure higher velocity. 2. Patent left common carotid artery to subclavian artery bypass graft with less than 40% internal carotid artery stenosis. 3. Known occluded right external carotid artery     Compared to the previous exam:  No change      Assessment: Maria ClementMargaret Critzer is a 80 y.o. female who is s/p left CEA and left CCA to subclavian bypass in July 2003, and a redo of left CEA with Dr. Madilyn FiremanHayes in 2008.  She presents with asymptomatic minimal right ICA stenosis and a patent left common carotid artery to subclavian artery bypass graft with less than 40% internal carotid artery stenosis. The  ICA stenosis is  Unchanged from previous exam.  Plan: Follow-up in 1 year with Carotid Duplex.   I discussed in depth with the patient the nature of atherosclerosis, and emphasized the importance of maximal medical management including strict control of blood pressure, blood glucose, and lipid levels, obtaining regular exercise, and  continued cessation of smoking.  The patient is aware that without maximal medical management the underlying atherosclerotic disease process will progress, limiting the benefit of any interventions. The patient was given information about stroke prevention and what symptoms should prompt the patient to seek immediate medical care. Thank you for allowing us to participate in this patient's care.  Charisse MarchSuzanne Nickel, RN, MSN, FNP-C [facility] Office: [phone]  Clinic Physician: Edilia BoDickson  11/24/2014 12:05 PM

## 2014-12-28 ENCOUNTER — Ambulatory Visit (INDEPENDENT_AMBULATORY_CARE_PROVIDER_SITE_OTHER): Payer: Medicare Other | Admitting: Pharmacist Clinician (PhC)/ Clinical Pharmacy Specialist

## 2014-12-28 DIAGNOSIS — I714 Abdominal aortic aneurysm, without rupture, unspecified: Secondary | ICD-10-CM

## 2014-12-28 LAB — POCT INR: INR: 2.6

## 2015-01-10 ENCOUNTER — Encounter: Payer: Self-pay | Admitting: Nurse Practitioner

## 2015-01-10 ENCOUNTER — Ambulatory Visit (INDEPENDENT_AMBULATORY_CARE_PROVIDER_SITE_OTHER): Payer: Medicare Other | Admitting: Nurse Practitioner

## 2015-01-10 VITALS — BP 162/72 | HR 66 | Temp 97.4°F | Ht 65.0 in | Wt 144.2 lb

## 2015-01-10 DIAGNOSIS — I10 Essential (primary) hypertension: Secondary | ICD-10-CM | POA: Diagnosis not present

## 2015-01-10 DIAGNOSIS — I714 Abdominal aortic aneurysm, without rupture, unspecified: Secondary | ICD-10-CM

## 2015-01-10 DIAGNOSIS — I679 Cerebrovascular disease, unspecified: Secondary | ICD-10-CM

## 2015-01-10 DIAGNOSIS — K219 Gastro-esophageal reflux disease without esophagitis: Secondary | ICD-10-CM

## 2015-01-10 DIAGNOSIS — E785 Hyperlipidemia, unspecified: Secondary | ICD-10-CM | POA: Diagnosis not present

## 2015-01-10 DIAGNOSIS — I253 Aneurysm of heart: Secondary | ICD-10-CM

## 2015-01-10 DIAGNOSIS — J410 Simple chronic bronchitis: Secondary | ICD-10-CM

## 2015-01-10 DIAGNOSIS — I739 Peripheral vascular disease, unspecified: Secondary | ICD-10-CM

## 2015-01-10 DIAGNOSIS — K222 Esophageal obstruction: Secondary | ICD-10-CM

## 2015-01-10 DIAGNOSIS — Z9889 Other specified postprocedural states: Secondary | ICD-10-CM

## 2015-01-10 DIAGNOSIS — I2510 Atherosclerotic heart disease of native coronary artery without angina pectoris: Secondary | ICD-10-CM

## 2015-01-10 DIAGNOSIS — Z8719 Personal history of other diseases of the digestive system: Secondary | ICD-10-CM

## 2015-01-10 DIAGNOSIS — Z7901 Long term (current) use of anticoagulants: Secondary | ICD-10-CM

## 2015-01-10 MED ORDER — HYDROCODONE-ACETAMINOPHEN 5-325 MG PO TABS: 1.0000 | ORAL_TABLET | Freq: Four times a day (QID) | ORAL | Status: DC | PRN

## 2015-01-10 NOTE — Patient Instructions (Signed)

## 2015-01-10 NOTE — Progress Notes (Signed)
Subjective:    Patient ID: [name], female    DOB: 15-Dec-1931, 81 y.o.   MRN: [id number]  HPI   Patient here today for follow - up of chronic medical problems- She says she is doing well and has no complaints today: Patient is still very nervous since passing of her husband and will not stay in her house by herself. She sleep at her sister in-law at night.   Patient Active Problem List   Diagnosis Date Noted  . AAA (abdominal aortic aneurysm) without rupture 03/24/2013  . Chronic anticoagulation 08/07/2011  . Cerebrovascular disease 08/07/2011  . CAD (coronary artery disease) 08/07/2011  . PVD (peripheral vascular disease) 08/07/2011  . Benign hypertension 08/07/2011  . Hyperlipidemia 08/07/2011  . Status post dilatation of esophageal stricture 08/07/2011  . Ventricular aneurysm 08/07/2011  . GERD with stricture 08/07/2011  . COPD (chronic obstructive pulmonary disease) 08/07/2011   Outpatient Encounter Prescriptions as of 01/10/2015  Medication Sig  . amLODipine (NORVASC) 5 MG tablet Take 1 tablet (5 mg total) by mouth daily.  [name] atorvastatin (LIPITOR) 40 MG tablet TAKE ONE TABLET BY MOUTH ONE TIME DAILY  . Calcium Carbonate 1500 MG TABS Take 1 tablet by mouth every morning.  . fish oil-omega-3 fatty acids 1000 MG capsule Take 1 g by mouth every morning.   [name] HYDROcodone-acetaminophen (NORCO/VICODIN) 5-325 MG per tablet Take 1 tablet by mouth every 6 (six) hours as needed. For pain  . metoprolol (LOPRESSOR) 50 MG tablet TAKE ONE TABLET BY MOUTH TWICE DAILY  . triamterene-hydrochlorothiazide (MAXZIDE-25) 37.5-25 MG per tablet Take 1 tablet by mouth daily.  [name] warfarin (COUMADIN) 4 MG tablet TAKE ONE TABLET BY MOUTH EVERY DAY AS DIRECTED           Review of Systems  Constitutional: Negative.   HENT: Negative.   Eyes: Negative.   Respiratory: Negative.   Cardiovascular: Negative.   Gastrointestinal: Negative.   Musculoskeletal: Positive for arthralgias.  Skin: Negative.     Neurological: Negative.   Hematological: Negative.   Psychiatric/Behavioral: Negative.       Physical Exam  Cardiovascular:  Murmur heard.   Physical Exam  Constitutional: She is oriented to person, place, and time. She appears well-developed and well-nourished.  HENT:  Nose: Nose normal.  Mouth/Throat: Oropharynx is clear and moist.  Eyes: EOM are normal.  Neck: Trachea normal, normal range of motion and full passive range of motion without pain. Neck supple. No JVD present. Carotid bruit is not present. No thyromegaly present.  Cardiovascular: Normal rate, regular rhythm, normal heart sounds and intact distal pulses.  Exam reveals no gallop and no friction rub.   2/6 systoilic murmur Pulmonary/Chest: Effort normal and breath sounds normal.  Abdominal: Soft. Bowel sounds are normal. She exhibits no distension and no mass. There is no tenderness.  Musculoskeletal: Normal range of motion.  Lymphadenopathy:    She has no cervical adenopathy.  Neurological: She is alert and oriented to person, place, and time. She has normal reflexes.  Skin: Skin is warm and dry.  Psychiatric: She has a normal mood and affect. Her behavior is normal. Judgment and thought content normal.    BP 162/72 mmHg  Pulse 66  Temp(Src) 97.4 F (36.3 C) (Oral)  Ht $R'5\' 5"'YN$  (1.651 m)  Wt 144 lb 3.2 oz (65.409 kg)  BMI 24.00 kg/m2       Assessment & Plan:   1. Cerebrovascular disease   2. Coronary artery disease involving native coronary artery of native  heart without angina pectoris   3. PVD (peripheral vascular disease)   4. Benign hypertension   5. Ventricular aneurysm   6. AAA (abdominal aortic aneurysm) without rupture   7. Simple chronic bronchitis   8. GERD with stricture   9. Chronic anticoagulation   10. Hyperlipidemia   11. Status post dilatation of esophageal stricture    Orders Placed This Encounter  Procedures  . NMR, lipoprofile  . CMP14+EGFR   Meds ordered this encounter   Medications  . HYDROcodone-acetaminophen (NORCO/VICODIN) 5-325 MG per tablet    Sig: Take 1 tablet by mouth every 6 (six) hours as needed. For pain    Dispense:  60 tablet    Refill:  0    Order Specific Question:  Supervising Provider    Answer:  [place] [facility] [place] maintenance reviewed Refuses Dexa scan- weight bearing exercises encouraged Labs pending Follow up in 3 months  Walker, FNP

## 2015-01-11 LAB — CMP14+EGFR
ALBUMIN: 4.3 g/dL (ref 3.5–4.7)
ALT: 22 IU/L (ref 0–32)
AST: 30 IU/L (ref 0–40)
Albumin/Globulin Ratio: 2 (ref 1.1–2.5)
Alkaline Phosphatase: 95 IU/L (ref 39–117)
BUN/Creatinine Ratio: 9 — ABNORMAL LOW (ref 11–26)
BUN: 12 mg/dL (ref 8–27)
CHLORIDE: 105 mmol/L (ref 97–108)
CO2: 23 mmol/L (ref 18–29)
Calcium: 9.6 mg/dL (ref 8.7–10.3)
Creatinine, Ser: 1.38 mg/dL — ABNORMAL HIGH (ref 0.57–1.00)
GFR calc non Af Amer: 36 mL/min/{1.73_m2} — ABNORMAL LOW (ref >59)
GFR, EST AFRICAN AMERICAN: 42 mL/min/{1.73_m2} — AB (ref >59)
GLUCOSE: 99 mg/dL (ref 65–99)
Globulin, Total: 2.2 g/dL (ref 1.5–4.5)
Potassium: 4.7 mmol/L (ref 3.5–5.2)
Sodium: 142 mmol/L (ref 134–144)
Total Bilirubin: 0.9 mg/dL (ref 0.0–1.2)
Total Protein: 6.5 g/dL (ref 6.0–8.5)

## 2015-01-11 LAB — NMR, LIPOPROFILE
Cholesterol: 157 mg/dL (ref 100–199)
HDL CHOLESTEROL BY NMR: 45 mg/dL (ref >39)
HDL Particle Number: 38.7 umol/L (ref >=30.5)
LDL Particle Number: 742 nmol/L (ref <1000)
LDL SIZE: 19.8 nm (ref >20.5)
LDL-C: 72 mg/dL (ref 0–99)
LP-IR SCORE: 46 — AB (ref <=45)
Small LDL Particle Number: 538 nmol/L — ABNORMAL HIGH (ref <=527)
Triglycerides by NMR: 202 mg/dL — ABNORMAL HIGH (ref 0–149)

## 2015-02-08 ENCOUNTER — Ambulatory Visit (INDEPENDENT_AMBULATORY_CARE_PROVIDER_SITE_OTHER): Payer: Medicare Other | Admitting: Pharmacist Clinician (PhC)/ Clinical Pharmacy Specialist

## 2015-02-08 DIAGNOSIS — I714 Abdominal aortic aneurysm, without rupture, unspecified: Secondary | ICD-10-CM

## 2015-02-08 LAB — POCT INR: INR: 2.8

## 2015-03-15 DIAGNOSIS — I739 Peripheral vascular disease, unspecified: Secondary | ICD-10-CM | POA: Diagnosis not present

## 2015-03-15 DIAGNOSIS — I2510 Atherosclerotic heart disease of native coronary artery without angina pectoris: Secondary | ICD-10-CM | POA: Diagnosis not present

## 2015-03-15 DIAGNOSIS — I119 Hypertensive heart disease without heart failure: Secondary | ICD-10-CM | POA: Diagnosis not present

## 2015-03-15 DIAGNOSIS — E785 Hyperlipidemia, unspecified: Secondary | ICD-10-CM | POA: Diagnosis not present

## 2015-03-15 DIAGNOSIS — I252 Old myocardial infarction: Secondary | ICD-10-CM | POA: Diagnosis not present

## 2015-03-15 DIAGNOSIS — I349 Nonrheumatic mitral valve disorder, unspecified: Secondary | ICD-10-CM | POA: Diagnosis not present

## 2015-03-16 ENCOUNTER — Encounter: Payer: Self-pay | Admitting: Cardiology

## 2015-03-16 ENCOUNTER — Other Ambulatory Visit: Payer: Self-pay | Admitting: Family Medicine

## 2015-03-16 DIAGNOSIS — I252 Old myocardial infarction: Secondary | ICD-10-CM | POA: Insufficient documentation

## 2015-03-16 DIAGNOSIS — G629 Polyneuropathy, unspecified: Secondary | ICD-10-CM | POA: Insufficient documentation

## 2015-03-16 DIAGNOSIS — I6990 Unspecified sequelae of unspecified cerebrovascular disease: Secondary | ICD-10-CM | POA: Insufficient documentation

## 2015-03-28 DIAGNOSIS — R011 Cardiac murmur, unspecified: Secondary | ICD-10-CM | POA: Diagnosis not present

## 2015-03-29 ENCOUNTER — Ambulatory Visit (INDEPENDENT_AMBULATORY_CARE_PROVIDER_SITE_OTHER): Payer: Medicare Other | Admitting: Pharmacist Clinician (PhC)/ Clinical Pharmacy Specialist

## 2015-03-29 DIAGNOSIS — I714 Abdominal aortic aneurysm, without rupture, unspecified: Secondary | ICD-10-CM

## 2015-03-29 LAB — POCT INR: INR: 1.9

## 2015-04-14 ENCOUNTER — Other Ambulatory Visit: Payer: Self-pay | Admitting: Nurse Practitioner

## 2015-04-18 ENCOUNTER — Encounter: Payer: Self-pay | Admitting: Nurse Practitioner

## 2015-04-18 ENCOUNTER — Ambulatory Visit (INDEPENDENT_AMBULATORY_CARE_PROVIDER_SITE_OTHER): Payer: Medicare Other | Admitting: Nurse Practitioner

## 2015-04-18 VITALS — BP 136/65 | HR 69 | Temp 97.3°F | Ht 65.0 in | Wt 142.0 lb

## 2015-04-18 DIAGNOSIS — I2510 Atherosclerotic heart disease of native coronary artery without angina pectoris: Secondary | ICD-10-CM | POA: Diagnosis not present

## 2015-04-18 DIAGNOSIS — I119 Hypertensive heart disease without heart failure: Secondary | ICD-10-CM

## 2015-04-18 DIAGNOSIS — K219 Gastro-esophageal reflux disease without esophagitis: Secondary | ICD-10-CM

## 2015-04-18 DIAGNOSIS — M549 Dorsalgia, unspecified: Secondary | ICD-10-CM

## 2015-04-18 DIAGNOSIS — I10 Essential (primary) hypertension: Secondary | ICD-10-CM | POA: Diagnosis not present

## 2015-04-18 DIAGNOSIS — K222 Esophageal obstruction: Secondary | ICD-10-CM | POA: Diagnosis not present

## 2015-04-18 DIAGNOSIS — J410 Simple chronic bronchitis: Secondary | ICD-10-CM

## 2015-04-18 DIAGNOSIS — E785 Hyperlipidemia, unspecified: Secondary | ICD-10-CM

## 2015-04-18 DIAGNOSIS — Z7901 Long term (current) use of anticoagulants: Secondary | ICD-10-CM | POA: Diagnosis not present

## 2015-04-18 DIAGNOSIS — G8929 Other chronic pain: Secondary | ICD-10-CM | POA: Diagnosis not present

## 2015-04-18 MED ORDER — WARFARIN SODIUM 4 MG PO TABS: 4.0000 mg | ORAL_TABLET | Freq: Every day | ORAL | Status: DC

## 2015-04-18 MED ORDER — ATORVASTATIN CALCIUM 40 MG PO TABS: 40.0000 mg | ORAL_TABLET | Freq: Every day | ORAL | Status: DC

## 2015-04-18 MED ORDER — HYDROCODONE-ACETAMINOPHEN 5-325 MG PO TABS: 1.0000 | ORAL_TABLET | Freq: Four times a day (QID) | ORAL | Status: DC | PRN

## 2015-04-18 MED ORDER — AMLODIPINE BESYLATE 5 MG PO TABS: 5.0000 mg | ORAL_TABLET | Freq: Every day | ORAL | Status: DC

## 2015-04-18 MED ORDER — TRIAMTERENE-HCTZ 37.5-25 MG PO TABS: 1.0000 | ORAL_TABLET | Freq: Every day | ORAL | Status: DC

## 2015-04-18 MED ORDER — METOPROLOL TARTRATE 50 MG PO TABS: 50.0000 mg | ORAL_TABLET | Freq: Two times a day (BID) | ORAL | Status: DC

## 2015-04-18 NOTE — Progress Notes (Signed)
Subjective:    Patient ID: [name], female    DOB: 1932-04-15, 81 y.o.   MRN: [id number]  Hypertension This is a chronic problem. The current episode started more than 1 year ago. The problem is unchanged. The problem is controlled. Risk factors for coronary artery disease include dyslipidemia, post-menopausal state and sedentary lifestyle. Past treatments include calcium channel blockers and diuretics. The current treatment provides moderate improvement. Hypertensive end-organ damage includes CAD/MI.  Hyperlipidemia This is a chronic problem. The current episode started more than 1 year ago. Recent lipid tests were reviewed and are variable. She has no history of diabetes, hypothyroidism or obesity. Current antihyperlipidemic treatment includes statins. The current treatment provides moderate improvement of lipids. Compliance problems include adherence to diet and adherence to exercise.  Risk factors for coronary artery disease include dyslipidemia, hypertension, post-menopausal and a sedentary lifestyle.  CAD/CVA/AAA Patient doing well. No reisdual effects from stroke- sees cariologist every 6 months. Metroplol working weel. Had echo cardiogram in April which was unchanged from previous   Review of Systems  Constitutional: Negative.   HENT: Negative.   Respiratory: Negative.   Cardiovascular: Negative.   Gastrointestinal: Negative.   Genitourinary: Negative.   Neurological: Negative.   Psychiatric/Behavioral: Negative.   All other systems reviewed and are negative.      Objective:   Physical Exam  Constitutional: She is oriented to person, place, and time. She appears well-developed and well-nourished.  HENT:  Nose: Nose normal.  Mouth/Throat: Oropharynx is clear and moist.  Eyes: EOM are normal.  Neck: Trachea normal, normal range of motion and full passive range of motion without pain. Neck supple. No JVD present. Carotid bruit is not present. No thyromegaly present.    Cardiovascular: Normal rate, regular rhythm, normal heart sounds and intact distal pulses.  Exam reveals no gallop and no friction rub.   No murmur heard. Right carotid bruit 2/6   Pulmonary/Chest: Effort normal and breath sounds normal.  Abdominal: Soft. Bowel sounds are normal. She exhibits no distension and no mass. There is no tenderness.  Musculoskeletal: Normal range of motion.  Lymphadenopathy:    She has no cervical adenopathy.  Neurological: She is alert and oriented to person, place, and time. She has normal reflexes.  Skin: Skin is warm and dry.  Psychiatric: She has a normal mood and affect. Her behavior is normal. Judgment and thought content normal.   BP 136/65 mmHg  Pulse 69  Temp(Src) 97.3 F (36.3 C) (Oral)  Ht 5\' 5"  (1.651 m)  Wt 142 lb (64.411 kg)  BMI 23.63 kg/m2        Assessment & Plan:  1. Hypertensive heart disease without heart failure Do not add salt to diet - amLODipine (NORVASC) 5 MG tablet; Take 1 tablet (5 mg total) by mouth daily.  Dispense: 30 tablet; Refill: 5  2. Coronary artery disease involving native coronary artery of native heart without angina pectoris - metoprolol (LOPRESSOR) 50 MG tablet; Take 1 tablet (50 mg total) by mouth 2 (two) times daily.  Dispense: 60 tablet; Refill: 5  3. Simple chronic bronchitis  4. GERD with stricture Avoid spicy foods Do not eat 2 hours prior to bedtime   5. Hyperlipidemia Low fat diet - atorvastatin (LIPITOR) 40 MG tablet; Take 1 tablet (40 mg total) by mouth daily.  Dispense: 30 tablet; Refill: 5  6. Chronic anticoagulation - warfarin (COUMADIN) 4 MG tablet; Take 1 tablet (4 mg total) by mouth daily. as directed  Dispense: 30 tablet; Refill:  5  7. Benign hypertension  - triamterene-hydrochlorothiazide (MAXZIDE-25) 37.5-25 MG per tablet; Take 1 tablet by mouth daily.  Dispense: 30 tablet; Refill: 5  8. Chronic back pain - HYDROcodone-acetaminophen (NORCO/VICODIN) 5-325 MG per tablet; Take 1  tablet by mouth every 6 (six) hours as needed. For pain  Dispense: 60 tablet; Refill: 0    Labs pending Health maintenance reviewed Diet and exercise encouraged Continue all meds Follow up  In 3 month   Mary-Shatori Daphine DeutscherMartin, FNP

## 2015-04-18 NOTE — Patient Instructions (Signed)
Bone Health Our bones do many things. They provide structure, protect organs, anchor muscles, and store calcium. Adequate calcium in your diet and weight-bearing physical activity help build strong bones, improve bone amounts, and may reduce the risk of weakening of bones (osteoporosis) later in life. PEAK BONE MASS By age 79, the average woman has acquired most of her skeletal bone mass. A large decline occurs in older adults which increases the risk of osteoporosis. In women this occurs around the time of menopause. It is important for young girls to reach their peak bone mass in order to maintain bone health throughout life. A person with high bone mass as a young adult will be more likely to have a higher bone mass later in life. Not enough calcium consumption and physical activity early on could result in a failure to achieve optimum bone mass in adulthood. OSTEOPOROSIS Osteoporosis is a disease of the bones. It is defined as low bone mass with deterioration of bone structure. Osteoporosis leads to an increase risk of fractures with falls. These fractures commonly happen in the wrist, hip, and spine. While men and women of all ages and background can develop osteoporosis, some of the risk factors for osteoporosis are:  Female.  White.  Postmenopausal.  Older adults.  Small in body size.  Eating a diet low in calcium.  Physically inactive.  Smoking.  Use of some medications.  Family history. CALCIUM Calcium is a mineral needed by the body for healthy bones, teeth, and proper function of the heart, muscles, and nerves. The body cannot produce calcium so it must be absorbed through food. Good sources of calcium include:  Dairy products (low fat or nonfat milk, cheese, and yogurt).  Dark green leafy vegetables (bok choy and broccoli).  Calcium fortified foods (orange juice, cereal, bread, soy beverages, and tofu products).  Nuts (almonds). Recommended amounts of calcium vary  for individuals. RECOMMENDED CALCIUM INTAKES Age and Amount in mg per day  Children 1 to 3 years / 700 mg  Children 4 to 8 years / 1,000 mg  Children 9 to 13 years / 1,300 mg  Teens 14 to 18 years / 1,300 mg  Adults 19 to 50 years / 1,000 mg  Adult women 51 to 70 years / 1,200 mg  Adults 71 years and older / 1,200 mg  Pregnant and breastfeeding teens / 1,300 mg  Pregnant and breastfeeding adults / 1,000 mg Vitamin D also plays an important role in healthy bone development. Vitamin D helps in the absorption of calcium. WEIGHT-BEARING PHYSICAL ACTIVITY Regular physical activity has many positive health benefits. Benefits include strong bones. Weight-bearing physical activity early in life is important in reaching peak bone mass. Weight-bearing physical activities cause muscles and bones to work against gravity. Some examples of weight bearing physical activities include:  Walking, jogging, or running.  Field Hockey.  Jumping rope.  Dancing.  Soccer.  Tennis or Racquetball.  Stair climbing.  Basketball.  Hiking.  Weight lifting.  Aerobic fitness classes. Including weight-bearing physical activity into an exercise plan is a great way to keep bones healthy. Adults: Engage in at least 30 minutes of moderate physical activity on most, preferably all, days of the week. Children: Engage in at least 60 minutes of moderate physical activity on most, preferably all, days of the week. FOR MORE INFORMATION [place] Department of Agriculture, Center for Nutrition Policy and Promotion: www.cnpp.usda.gov National Osteoporosis Foundation: www.nof.org Document Released: 02/16/2004 Document Revised: 03/23/2013 Document Reviewed: 05/18/2009 [employer] Patient Information   2015 [employer]. This information is not intended to replace advice given to you by your health care provider. Make sure you discuss any questions you have with your health care provider.  

## 2015-05-03 ENCOUNTER — Ambulatory Visit (INDEPENDENT_AMBULATORY_CARE_PROVIDER_SITE_OTHER): Payer: Medicare Other | Admitting: Pharmacist Clinician (PhC)/ Clinical Pharmacy Specialist

## 2015-05-03 DIAGNOSIS — I714 Abdominal aortic aneurysm, without rupture, unspecified: Secondary | ICD-10-CM

## 2015-05-03 DIAGNOSIS — I718 Aortic aneurysm of unspecified site, ruptured: Secondary | ICD-10-CM

## 2015-05-03 LAB — POCT INR: INR: 2.4

## 2015-05-16 ENCOUNTER — Encounter: Payer: Self-pay | Admitting: Family Medicine

## 2015-06-21 ENCOUNTER — Ambulatory Visit (INDEPENDENT_AMBULATORY_CARE_PROVIDER_SITE_OTHER): Payer: Medicare Other | Admitting: Pharmacist Clinician (PhC)/ Clinical Pharmacy Specialist

## 2015-06-21 DIAGNOSIS — I718 Aortic aneurysm of unspecified site, ruptured: Secondary | ICD-10-CM | POA: Diagnosis not present

## 2015-06-21 DIAGNOSIS — I714 Abdominal aortic aneurysm, without rupture, unspecified: Secondary | ICD-10-CM

## 2015-06-21 LAB — POCT INR: INR: 2.5

## 2015-07-10 ENCOUNTER — Other Ambulatory Visit: Payer: Self-pay | Admitting: Nurse Practitioner

## 2015-07-11 ENCOUNTER — Observation Stay (HOSPITAL_COMMUNITY)
Admission: EM | Admit: 2015-07-11 | Discharge: 2015-07-12 | Disposition: A | Discharge status: Home / Self Care | Payer: Medicare Other | Attending: Internal Medicine | Admitting: Internal Medicine

## 2015-07-11 ENCOUNTER — Encounter (HOSPITAL_COMMUNITY): Payer: Self-pay

## 2015-07-11 DIAGNOSIS — Z951 Presence of aortocoronary bypass graft: Secondary | ICD-10-CM | POA: Diagnosis not present

## 2015-07-11 DIAGNOSIS — R1084 Generalized abdominal pain: Secondary | ICD-10-CM | POA: Diagnosis not present

## 2015-07-11 DIAGNOSIS — T18128A Food in esophagus causing other injury, initial encounter: Secondary | ICD-10-CM | POA: Diagnosis not present

## 2015-07-11 DIAGNOSIS — K2971 Gastritis, unspecified, with bleeding: Secondary | ICD-10-CM | POA: Insufficient documentation

## 2015-07-11 DIAGNOSIS — Z7901 Long term (current) use of anticoagulants: Secondary | ICD-10-CM | POA: Insufficient documentation

## 2015-07-11 DIAGNOSIS — K222 Esophageal obstruction: Secondary | ICD-10-CM | POA: Diagnosis present

## 2015-07-11 DIAGNOSIS — I253 Aneurysm of heart: Secondary | ICD-10-CM | POA: Diagnosis present

## 2015-07-11 DIAGNOSIS — K219 Gastro-esophageal reflux disease without esophagitis: Secondary | ICD-10-CM | POA: Diagnosis not present

## 2015-07-11 DIAGNOSIS — R1310 Dysphagia, unspecified: Secondary | ICD-10-CM | POA: Diagnosis not present

## 2015-07-11 DIAGNOSIS — Z79899 Other long term (current) drug therapy: Secondary | ICD-10-CM | POA: Diagnosis not present

## 2015-07-11 DIAGNOSIS — R079 Chest pain, unspecified: Secondary | ICD-10-CM | POA: Diagnosis not present

## 2015-07-11 DIAGNOSIS — I739 Peripheral vascular disease, unspecified: Secondary | ICD-10-CM

## 2015-07-11 NOTE — Consult Note (Signed)
Referring Provider:   Dr. Pecola Leisure (edp) Primary Care Physician:  Bennie Pierini, FNP Primary Gastroenterologist:  None (unassigned)--formerly saw Dr. Cheryln Manly  Reason for Consultation:  Food impaction  HPI: [name] is a 81 y.o. female who developed a food impaction (chicken) approximately 30 hours ago, following a one-year history of progressively frequent transient dysphagia episodes, not associated with weight loss.  The patient indicates she has had several esophageal dilatations in the past, although the most recent one was about 10 years ago.  The patient has been on chronic anticoagulation with coumadin since her ventricular aneurysm repair about 16 years ago, and today has a therapeutic INR.  She has some chest discomfort and was unable to sleep well last night.  A BaS at [facility] earlier this evening showed a complete food impaction (by telephone report from the ER PA there) and the patient asked to be transferred here for treatment in view of her cardiac history.  Subjectively, she feels like the impaction is still present.   Past Medical History  Diagnosis Date  . Migraines   . Lower back pain     disc   . Osteoporosis   . Hyperlipidemia   . Hypertension   . Urethral stenosis   . Aortic aneurysm   . Cardiac aneurysm     ventrral - bypass   . History of aorto-femoral bypass   . Pancreatitis   . Stricture esophagus     Hx  . GERD (gastroesophageal reflux disease)   . Carotid bruit     bil carotid bruits   . Peptic ulcer     Hx   . Gout   . H/O blood clots   . Carotid artery occlusion   . Myocardial infarction   . Stroke     Right eye stroke    Past Surgical History  Procedure Laterality Date  . Esophageal dilation  1996  . Lumbar disc surgery  1993  . Partial hysterectomy  1966    single oopherectomy (right)   . Aortic & cardiac aneurysm rupture  04/1998  . Aorta - bilateral femoral artery bypass graft  7/99    Dr. Madilyn Fireman   . Carotid  endarterectomy  July 2003    Left  . Carotid endarterectomy  11/18/2007    Left  . Abdominal hysterectomy      Partial  . Coronary artery bypass graft      16 yrs ago  . Arch aortogram N/A 11/17/2012    Procedure: ARCH AORTOGRAM;  Surgeon: Chuck Hint, MD;  Location: [facility] CATH LAB;  Service: Cardiovascular;  Laterality: N/A;    Prior to Admission medications   Medication Sig Start Date End Date Taking? Authorizing Provider  amLODipine (NORVASC) 5 MG tablet Take 1 tablet (5 mg total) by mouth daily. 04/18/15  Yes Mary-Shatina Daphine Deutscher, FNP  atorvastatin (LIPITOR) 40 MG tablet Take 1 tablet (40 mg total) by mouth daily. 04/18/15  Yes Mary-Georgean Daphine Deutscher, FNP  Calcium Carbonate 1500 MG TABS Take 1 tablet by mouth every morning. 08/09/11  Yes Henderson Cloud, MD  fish oil-omega-3 fatty acids 1000 MG capsule Take 1 g by mouth every morning.    Yes Historical Provider, MD  metoprolol (LOPRESSOR) 50 MG tablet Take 1 tablet (50 mg total) by mouth 2 (two) times daily. 04/18/15  Yes Mary-Jonathan Daphine Deutscher, FNP  triamterene-hydrochlorothiazide (MAXZIDE-25) 37.5-25 MG per tablet TAKE ONE TABLET BY MOUTH ONE TIME DAILY 07/11/15  Yes Mary-Elissa Daphine Deutscher, FNP  warfarin (COUMADIN) 4  MG tablet Take 1 tablet (4 mg total) by mouth daily. as directed Patient taking differently: Take 2-4 mg by mouth See admin instructions. Alternates taking 1 tablet one day then 1/2 tablet the next day. 04/18/15  Yes Mary-Lashauna Daphine Deutscher, FNP  HYDROcodone-acetaminophen (NORCO/VICODIN) 5-325 MG per tablet Take 1 tablet by mouth every 6 (six) hours as needed. For pain Patient not taking: Reported on 07/11/2015 04/18/15   Mary-Jett Daphine Deutscher, FNP    No current facility-administered medications for this encounter.   Current Outpatient Prescriptions  Medication Sig Dispense Refill  . amLODipine (NORVASC) 5 MG tablet Take 1 tablet (5 mg total) by mouth daily. 30 tablet 5  . atorvastatin (LIPITOR) 40 MG tablet Take 1 tablet (40  mg total) by mouth daily. 30 tablet 5  . Calcium Carbonate 1500 MG TABS Take 1 tablet by mouth every morning.    . fish oil-omega-3 fatty acids 1000 MG capsule Take 1 g by mouth every morning.     . metoprolol (LOPRESSOR) 50 MG tablet Take 1 tablet (50 mg total) by mouth 2 (two) times daily. 60 tablet 5  . triamterene-hydrochlorothiazide (MAXZIDE-25) 37.5-25 MG per tablet TAKE ONE TABLET BY MOUTH ONE TIME DAILY 30 tablet 2  . warfarin (COUMADIN) 4 MG tablet Take 1 tablet (4 mg total) by mouth daily. as directed (Patient taking differently: Take 2-4 mg by mouth See admin instructions. Alternates taking 1 tablet one day then 1/2 tablet the next day.) 30 tablet 5  . HYDROcodone-acetaminophen (NORCO/VICODIN) 5-325 MG per tablet Take 1 tablet by mouth every 6 (six) hours as needed. For pain (Patient not taking: Reported on 07/11/2015) 60 tablet 0    Allergies as of 07/11/2015 - Review Complete 07/11/2015  Allergen Reaction Noted  . Alendronate sodium Other (See Comments) 04/26/2011  . Buffered aspirin Other (See Comments) 04/26/2011  . Ibuprofen Other (See Comments) 04/26/2011  . Prednisone Other (See Comments) 04/26/2011  . Vioxx [rofecoxib] Other (See Comments) 04/26/2011    Family History  Problem Relation Age of Onset  . CAD Father     died age 54+  . Heart disease Father     Older than 60  . Heart attack Father   . Osteoarthritis Sister     paraplegic due to disk rupture  . CAD Sister   . Arthritis Sister   . Colon cancer Neg Hx   . Varicose Veins Mother     History   Social History  . Marital Status: Widowed    Spouse Name: N/A  . Number of Children: N/A  . Years of Education: N/A   Occupational History  . retired     [name] in [employer], [place]   Social History Main Topics  . Smoking status: Former Smoker    Quit date: 04/06/1998  . Smokeless tobacco: Never Used  . Alcohol Use: No  . Drug Use: No  . Sexual Activity: Not on file   Other Topics Concern  . Not on file    Social History Narrative    Review of Systems: negative for anorexia, weight loss, other GI tract symptoms other than the above-mentioned dysphagia, specifically no heartburn or constipation, chest pain, orthopnea, shortness of breath (can walk moderate distances, for example, around a department store), rectal bleeding or hematuria, dizziness or leg swelling.  Physical Exam: Vital signs in last 24 hours:     General:   Alert,  Well-developed, well-nourished, pleasant and cooperative in NAD Head:  Normocephalic and atraumatic. Eyes:  Sclera clear, no  icterus.   Conjunctiva pink. Mouth:   No ulcerations or lesions.  Oropharynx pink & moist. Neck:   No masses or thyromegaly. Lungs:  Clear throughout to auscultation.   No wheezes, crackles, or rhonchi. No evident respiratory distress. Heart:   Regular rate and rhythm; 3/6 systolic murmur but no clicks, rubs,  or gallops. Abdomen:  Soft, nontender, nontympanitic, and nondistended. No masses, hepatosplenomegaly or ventral hernias noted, without bruits, guarding, or rebound.   Msk:   Symmetrical without gross deformities. Extremities:   Without clubbing, cyanosis, or edema. Neurologic:  Alert and coherent;  grossly normal neurologically. Skin:  Intact without significant lesions or rashes. Cervical Nodes:  No significant cervical adenopathy. Psych:   Alert and cooperative. Normal mood and affect.  Intake/Output from previous day:   Intake/Output this shift:    Lab Results: No results for input(s): WBC, HGB, HCT, PLT in the last 72 hours. BMET No results for input(s): NA, K, CL, CO2, GLUCOSE, BUN, CREATININE, CALCIUM in the last 72 hours. LFT No results for input(s): PROT, ALBUMIN, AST, ALT, ALKPHOS, BILITOT, BILIDIR, IBILI in the last 72 hours. PT/INR No results for input(s): LABPROT, INR in the last 72 hours.  Studies/Results: No results found.  Impression: 1.  Food impaction 2.  Chronic dysphagia, progressive, s/p previous  dilatations 3.  Chronic anticoagulation  Plan: Recommend:   1.  Generous fluids, since the patient has been unable to drink for over 24 hrs 2.  FFP (2 units) to temporarily reverse her anticoagulation 3.  EGD tomorrow (Tuesday), after FFP is in, for removal of food impaction--ideally with Anesthesia support and possibly endotracheal intubation for airway protection 4.  Consideration for possible esophageal dilatation (conservatively) at time of EGD, if the esophageal mucosa doesn't look too raw, to hopefully decrease the chance of recurrence of food impaction.  Risks reviewed, pt agreeable. 5.  IV PPI to help control acid overnight     Alysah Carton V  07/11/2015, 11:57 PM   Pager [phone] If no answer or after 5 PM call [phone]

## 2015-07-11 NOTE — ED Notes (Signed)
Per [facility]: Pt hx of epigastric strictures. States she ate a piece of chicken and has been unable to keep anything down yet. GI already consulted. ED MD at bedside. Pt a/ox4 NAD.

## 2015-07-11 NOTE — ED Provider Notes (Signed)
CSN: [id number]     Arrival date & time 07/11/15  2325 History   This chart was scribed for Tilden Fossa, MD by Evon Slack, ED Scribe. This patient was seen in room A04C/A04C and the patient's care was started at 11:27 PM.    Chief Complaint  Patient presents with  . Abdominal Pain   Patient is a 81 y.o. female presenting with abdominal pain. The history is provided by the patient. No language interpreter was used.  Abdominal Pain Pain location:  Epigastric  HPI Comments: [name] is a 81 y.o. female brought in by ambulance, who presents to the Emergency Department as a transfer from [facility] hospital. Pt presents with epigastric pain onset 1 day prior at 5 PM. Pt has a Hx of esophagus stricture. Pt states that her pain feels like previous pain when she had a previous food impaction. Pt state she feels as if she has chicken stuck in her esophagus from 1 day prior at 5 PM. Pt states she is still able to produce saliva. Pt doesn't report ay medications tried PTA. EMS states that [facility] hospital  has determined that the food impaction is in the distal esophagus. Pt states that she is on coumadin. Pt doesn't report any other symptoms.   Past Medical History  Diagnosis Date  . Migraines   . Lower back pain     disc   . Osteoporosis   . Hyperlipidemia   . Hypertension   . Urethral stenosis   . Aortic aneurysm   . Cardiac aneurysm     ventrral - bypass   . History of aorto-femoral bypass   . Pancreatitis   . Stricture esophagus     Hx  . GERD (gastroesophageal reflux disease)   . Carotid bruit     bil carotid bruits   . Peptic ulcer     Hx   . Gout   . H/O blood clots   . Carotid artery occlusion   . Myocardial infarction   . Stroke     Right eye stroke   Past Surgical History  Procedure Laterality Date  . Esophageal dilation  1996  . Lumbar disc surgery  1993  . Partial hysterectomy  1966    single oopherectomy (right)   . Aortic & cardiac aneurysm rupture   04/1998  . Aorta - bilateral femoral artery bypass graft  7/99    Dr. Madilyn Fireman   . Carotid endarterectomy  July 2003    Left  . Carotid endarterectomy  11/18/2007    Left  . Abdominal hysterectomy      Partial  . Coronary artery bypass graft      16 yrs ago  . Arch aortogram N/A 11/17/2012    Procedure: ARCH AORTOGRAM;  Surgeon: Chuck Hint, MD;  Location: [facility] CATH LAB;  Service: Cardiovascular;  Laterality: N/A;   Family History  Problem Relation Age of Onset  . CAD Father     died age 35+  . Heart disease Father     Older than 60  . Heart attack Father   . Osteoarthritis Sister     paraplegic due to disk rupture  . CAD Sister   . Arthritis Sister   . Colon cancer Neg Hx   . Varicose Veins Mother    History  Substance Use Topics  . Smoking status: Former Smoker    Quit date: 04/06/1998  . Smokeless tobacco: Never Used  . Alcohol Use: No   OB History  No data available      Review of Systems  Gastrointestinal: Positive for abdominal pain.  All other systems reviewed and are negative.    Allergies  Alendronate sodium; Buffered aspirin; Ibuprofen; Prednisone; and Vioxx  Home Medications   Prior to Admission medications   Medication Sig Start Date End Date Taking? Authorizing Provider  amLODipine (NORVASC) 5 MG tablet Take 1 tablet (5 mg total) by mouth daily. 04/18/15   Mary-Ardis Daphine Deutscher, FNP  atorvastatin (LIPITOR) 40 MG tablet Take 1 tablet (40 mg total) by mouth daily. 04/18/15   Mary-Alyzza Daphine Deutscher, FNP  Calcium Carbonate 1500 MG TABS Take 1 tablet by mouth every morning. 08/09/11   Henderson Cloud, MD  fish oil-omega-3 fatty acids 1000 MG capsule Take 1 g by mouth every morning.     Historical Provider, MD  HYDROcodone-acetaminophen (NORCO/VICODIN) 5-325 MG per tablet Take 1 tablet by mouth every 6 (six) hours as needed. For pain 04/18/15   Mary-Evelen Daphine Deutscher, FNP  metoprolol (LOPRESSOR) 50 MG tablet Take 1 tablet (50 mg total) by mouth 2  (two) times daily. 04/18/15   Mary-Damia Daphine Deutscher, FNP  triamterene-hydrochlorothiazide (MAXZIDE-25) 37.5-25 MG per tablet TAKE ONE TABLET BY MOUTH ONE TIME DAILY 07/11/15   Mary-Raul Daphine Deutscher, FNP  warfarin (COUMADIN) 4 MG tablet Take 1 tablet (4 mg total) by mouth daily. as directed 04/18/15   Mary-Kinda Daphine Deutscher, FNP   There were no vitals taken for this visit.   Physical Exam  Constitutional: She is oriented to person, place, and time. She appears well-developed and well-nourished.  HENT:  Head: Normocephalic and atraumatic.  Cardiovascular: Normal rate and regular rhythm.   Murmur heard.  Systolic murmur is present  Systolic injection murmur.   Pulmonary/Chest: Effort normal and breath sounds normal. No respiratory distress.  Abdominal: Soft. There is no tenderness. There is no rebound and no guarding.  Musculoskeletal: She exhibits no edema or tenderness.  Neurological: She is alert and oriented to person, place, and time.  Skin: Skin is warm and dry.  Psychiatric: She has a normal mood and affect. Her behavior is normal.  Nursing note and vitals reviewed.   ED Course  Procedures (including critical care time) DIAGNOSTIC STUDIES:     COORDINATION OF CARE: 11:34 PM-Discussed treatment plan with pt at bedside and pt agreed to plan.     Labs Review Labs Reviewed  PROTIME-INR - Abnormal; Notable for the following:    Prothrombin Time 22.0 (*)    INR 1.93 (*)    All other components within normal limits  BASIC METABOLIC PANEL  CBC  TYPE AND SCREEN  PREPARE FRESH FROZEN PLASMA    Imaging Review No results found.   EKG Interpretation None      MDM   Final diagnoses:  Esophageal obstruction due to food impaction    Patient here for esophageal food impaction, on chronic Coumadin therapy. She has been transferred for evaluation by gastroenterology. Labs reviewed from prior facility, INR 2.3, creatinine 1.26. Discussed with Dr. Matthias Hughs with gastroenterology who  recommends admission to hospitalist service with reversal of Coumadin therapy for scope in the morning. Discussed with patient plan for admission for further evaluation and management. Hospitalist consulted for admission.  I personally performed the services described in this documentation, which was scribed in my presence. The recorded information has been reviewed and is accurate.      Tilden Fossa, MD 07/12/15 [phone]

## 2015-07-12 ENCOUNTER — Encounter (HOSPITAL_COMMUNITY): Payer: Self-pay | Admitting: Family Medicine

## 2015-07-12 ENCOUNTER — Encounter (HOSPITAL_COMMUNITY)
Admission: EM | Disposition: A | Discharge status: Home / Self Care | Payer: Self-pay | Source: Home / Self Care | Attending: Emergency Medicine

## 2015-07-12 ENCOUNTER — Observation Stay (HOSPITAL_COMMUNITY): Payer: Medicare Other | Admitting: Anesthesiology

## 2015-07-12 DIAGNOSIS — Z7901 Long term (current) use of anticoagulants: Secondary | ICD-10-CM | POA: Diagnosis not present

## 2015-07-12 DIAGNOSIS — T18120A Food in esophagus causing compression of trachea, initial encounter: Secondary | ICD-10-CM | POA: Diagnosis not present

## 2015-07-12 DIAGNOSIS — K219 Gastro-esophageal reflux disease without esophagitis: Secondary | ICD-10-CM | POA: Diagnosis not present

## 2015-07-12 DIAGNOSIS — R1310 Dysphagia, unspecified: Secondary | ICD-10-CM | POA: Diagnosis not present

## 2015-07-12 DIAGNOSIS — K222 Esophageal obstruction: Secondary | ICD-10-CM

## 2015-07-12 DIAGNOSIS — T18128A Food in esophagus causing other injury, initial encounter: Secondary | ICD-10-CM | POA: Diagnosis not present

## 2015-07-12 DIAGNOSIS — I739 Peripheral vascular disease, unspecified: Secondary | ICD-10-CM

## 2015-07-12 DIAGNOSIS — K208 Other esophagitis: Secondary | ICD-10-CM | POA: Diagnosis not present

## 2015-07-12 DIAGNOSIS — K449 Diaphragmatic hernia without obstruction or gangrene: Secondary | ICD-10-CM | POA: Diagnosis not present

## 2015-07-12 DIAGNOSIS — K2971 Gastritis, unspecified, with bleeding: Secondary | ICD-10-CM | POA: Diagnosis not present

## 2015-07-12 HISTORY — PX: ESOPHAGOGASTRODUODENOSCOPY (EGD) WITH PROPOFOL: SHX5813

## 2015-07-12 LAB — BASIC METABOLIC PANEL
Anion gap: 9 (ref 5–15)
BUN: 20 mg/dL (ref 6–20)
CO2: 25 mmol/L (ref 22–32)
CREATININE: 1.12 mg/dL — AB (ref 0.44–1.00)
Calcium: 9 mg/dL (ref 8.9–10.3)
Chloride: 108 mmol/L (ref 101–111)
GFR calc Af Amer: 52 mL/min — ABNORMAL LOW (ref >60)
GFR, EST NON AFRICAN AMERICAN: 45 mL/min — AB (ref >60)
Glucose, Bld: 124 mg/dL — ABNORMAL HIGH (ref 65–99)
Potassium: 3.7 mmol/L (ref 3.5–5.1)
SODIUM: 142 mmol/L (ref 135–145)

## 2015-07-12 LAB — CBC
HEMATOCRIT: 35.2 % — AB (ref 36.0–46.0)
Hemoglobin: 11.6 g/dL — ABNORMAL LOW (ref 12.0–15.0)
MCH: 30.4 pg (ref 26.0–34.0)
MCHC: 33 g/dL (ref 30.0–36.0)
MCV: 92.4 fL (ref 78.0–100.0)
Platelets: 207 10*3/uL (ref 150–400)
RBC: 3.81 MIL/uL — AB (ref 3.87–5.11)
RDW: 13.5 % (ref 11.5–15.5)
WBC: 11.4 10*3/uL — ABNORMAL HIGH (ref 4.0–10.5)

## 2015-07-12 LAB — TYPE AND SCREEN
ABO/RH(D): O POS
ANTIBODY SCREEN: NEGATIVE

## 2015-07-12 LAB — PROTIME-INR
INR: 1.93 — AB (ref 0.00–1.49)
Prothrombin Time: 22 seconds — ABNORMAL HIGH (ref 11.6–15.2)

## 2015-07-12 SURGERY — ESOPHAGOGASTRODUODENOSCOPY (EGD) WITH PROPOFOL
Anesthesia: Monitor Anesthesia Care

## 2015-07-12 MED ORDER — ONDANSETRON HCL 4 MG PO TABS: 4.0000 mg | ORAL_TABLET | Freq: Four times a day (QID) | ORAL | Status: DC | PRN

## 2015-07-12 MED ORDER — LACTATED RINGERS IV SOLN
INTRAVENOUS | Status: DC | PRN
  Administered 2015-07-12: 10:00:00 via INTRAVENOUS

## 2015-07-12 MED ORDER — SODIUM CHLORIDE 0.9 % IV SOLN
INTRAVENOUS | Status: DC
  Administered 2015-07-12: 06:00:00 via INTRAVENOUS

## 2015-07-12 MED ORDER — PROPOFOL 10 MG/ML IV BOLUS
INTRAVENOUS | Status: DC | PRN
  Administered 2015-07-12: 80 mg via INTRAVENOUS

## 2015-07-12 MED ORDER — WARFARIN - PHARMACIST DOSING INPATIENT: Freq: Every day | Status: DC

## 2015-07-12 MED ORDER — FENTANYL CITRATE (PF) 100 MCG/2ML IJ SOLN
INTRAMUSCULAR | Status: DC | PRN
  Administered 2015-07-12: 50 ug via INTRAVENOUS

## 2015-07-12 MED ORDER — ONDANSETRON HCL 4 MG/2ML IJ SOLN
INTRAMUSCULAR | Status: DC | PRN
  Administered 2015-07-12: 4 mg via INTRAVENOUS

## 2015-07-12 MED ORDER — ONDANSETRON HCL 4 MG/2ML IJ SOLN: 4.0000 mg | Freq: Four times a day (QID) | INTRAMUSCULAR | Status: DC | PRN

## 2015-07-12 MED ORDER — SUCCINYLCHOLINE CHLORIDE 20 MG/ML IJ SOLN
INTRAMUSCULAR | Status: DC | PRN
  Administered 2015-07-12: 60 mg via INTRAVENOUS

## 2015-07-12 MED ORDER — SODIUM CHLORIDE 0.9 % IV SOLN
INTRAVENOUS | Status: DC
  Administered 2015-07-12: 500 mL via INTRAVENOUS

## 2015-07-12 MED ORDER — PANTOPRAZOLE SODIUM 40 MG PO TBEC: 40.0000 mg | DELAYED_RELEASE_TABLET | Freq: Every day | ORAL | Status: DC

## 2015-07-12 MED ORDER — FENTANYL CITRATE (PF) 250 MCG/5ML IJ SOLN
INTRAMUSCULAR | Status: AC
  Filled 2015-07-12: qty 5

## 2015-07-12 MED ORDER — WARFARIN SODIUM 4 MG PO TABS
4.0000 mg | ORAL_TABLET | Freq: Once | ORAL | Status: DC
  Filled 2015-07-12: qty 1

## 2015-07-12 MED ORDER — PANTOPRAZOLE SODIUM 40 MG IV SOLR
40.0000 mg | INTRAVENOUS | Status: DC
  Administered 2015-07-12: 40 mg via INTRAVENOUS

## 2015-07-12 MED ORDER — LIDOCAINE HCL (CARDIAC) 20 MG/ML IV SOLN
INTRAVENOUS | Status: DC | PRN
  Administered 2015-07-12: 40 mg via INTRAVENOUS

## 2015-07-12 MED ORDER — PHENYLEPHRINE HCL 10 MG/ML IJ SOLN
INTRAMUSCULAR | Status: DC | PRN
  Administered 2015-07-12 (×4): 80 ug via INTRAVENOUS

## 2015-07-12 MED ORDER — SODIUM CHLORIDE 0.9 % IV SOLN
Freq: Once | INTRAVENOUS | Status: AC
  Administered 2015-07-12: 02:00:00 via INTRAVENOUS

## 2015-07-12 NOTE — H&P (Signed)
History and Physical  [name] ZOX:[id number] DOB: 1932-11-22 DOA: 07/11/2015  Referring physician: Dr. Tilden Fossa in ED PCP: Bennie Pierini, FNP  Cardiologist Dr. Donnie Aho  Chief Complaint: food stuck in throat  HPI:  83yow presented to [facility] with h/o food impaction since 7/31 PM. Workup at [facility] accompanies patient. Esophagram revealed complete obstruction of distal esophagus by foreign body. Transfer to [facility] was requested and GI Dr. Matthias Hughs agreed to see patient in consult.  Patient with h/o esophageal stricture and dilatation in past. Has had trouble swallowing 1 month but getting worse. Ate chicken 7/31 for dinner and could feel it get stuck. Has not been able to eat or drink since. No specific aggravating or alleviating factors.    On warfarin for vascular disease. Endoscopy planned, but Dr. Matthias Hughs recommends reversal of anticoagulation first.  Review of Systems:  Negative for fever, visual changes, sore throat, rash, new muscle aches, chest pain, SOB, dysuria, bleeding, n/v  Past Medical History  Diagnosis Date  . Migraines   . Lower back pain     disc   . Osteoporosis   . Hyperlipidemia   . Hypertension   . Urethral stenosis   . Aortic aneurysm   . Cardiac aneurysm     ventrral - bypass   . History of aorto-femoral bypass   . Pancreatitis   . Stricture esophagus     Hx  . GERD (gastroesophageal reflux disease)   . Carotid bruit     bil carotid bruits   . Peptic ulcer     Hx   . Gout   . H/O blood clots   . Carotid artery occlusion   . Myocardial infarction   . Stroke     Right eye stroke    Past Surgical History  Procedure Laterality Date  . Esophageal dilation  1996  . Lumbar disc surgery  1993  . Partial hysterectomy  1966    single oopherectomy (right)   . Aortic & cardiac aneurysm rupture  04/1998  . Aorta - bilateral femoral artery bypass graft  7/99    Dr. Madilyn Fireman   . Carotid endarterectomy  July 2003    Left  . Carotid  endarterectomy  11/18/2007    Left  . Abdominal hysterectomy      Partial  . Coronary artery bypass graft      16 yrs ago  . Arch aortogram N/A 11/17/2012    Procedure: ARCH AORTOGRAM;  Surgeon: Chuck Hint, MD;  Location: [facility] CATH LAB;  Service: Cardiovascular;  Laterality: N/A;    Social History:  reports that she quit smoking about 17 years ago. She has never used smokeless tobacco. She reports that she does not drink alcohol or use illicit drugs. lives alone Self-care  Allergies  Allergen Reactions  . Alendronate Sodium Other (See Comments)    Irritates esophagus and ulcers  . Buffered Aspirin Other (See Comments)    On blood thinners but doctor has started aspirin as stroke therapy  . Ibuprofen Other (See Comments)    Blood thinner therapy  . Prednisone Other (See Comments)    Patient states due to osteoporosis  . Vioxx [Rofecoxib] Other (See Comments)    Unknown by patient    Family History  Problem Relation Age of Onset  . CAD Father     died age 19+  . Heart disease Father     Older than 60  . Heart attack Father   . Osteoarthritis Sister     paraplegic  due to disk rupture  . CAD Sister   . Arthritis Sister   . Colon cancer Neg Hx   . Varicose Veins Mother      Prior to Admission medications   Medication Sig Start Date End Date Taking? Authorizing Provider  amLODipine (NORVASC) 5 MG tablet Take 1 tablet (5 mg total) by mouth daily. 04/18/15  Yes Mary-Yashika Daphine Deutscher, FNP  atorvastatin (LIPITOR) 40 MG tablet Take 1 tablet (40 mg total) by mouth daily. 04/18/15  Yes Mary-Yaritza Daphine Deutscher, FNP  Calcium Carbonate 1500 MG TABS Take 1 tablet by mouth every morning. 08/09/11  Yes Henderson Cloud, MD  fish oil-omega-3 fatty acids 1000 MG capsule Take 1 g by mouth every morning.    Yes Historical Provider, MD  metoprolol (LOPRESSOR) 50 MG tablet Take 1 tablet (50 mg total) by mouth 2 (two) times daily. 04/18/15  Yes Mary-Teira Daphine Deutscher, FNP    triamterene-hydrochlorothiazide (MAXZIDE-25) 37.5-25 MG per tablet TAKE ONE TABLET BY MOUTH ONE TIME DAILY 07/11/15  Yes Mary-Farheen Daphine Deutscher, FNP  warfarin (COUMADIN) 4 MG tablet Take 1 tablet (4 mg total) by mouth daily. as directed Patient taking differently: Take 2-4 mg by mouth See admin instructions. Alternates taking 1 tablet one day then 1/2 tablet the next day. 04/18/15  Yes Mary-Leena Daphine Deutscher, FNP  HYDROcodone-acetaminophen (NORCO/VICODIN) 5-325 MG per tablet Take 1 tablet by mouth every 6 (six) hours as needed. For pain Patient not taking: Reported on 07/11/2015 04/18/15   Mary-Mason Daphine Deutscher, FNP   Physical Exam: Filed Vitals:   07/11/15 2345 07/12/15 0000  BP: 157/58 157/61  Pulse: 94 99  Resp:  16  SpO2: 97% 97%    General: Appears calm and comfortable Eyes: PERRL, normal lids, irises ENT: grossly normal hearing, lips Neck: no LAD, masses or thyromegaly Cardiovascular: RRR, 2/6 systolic murmur, no r/g. No LE edema. Respiratory: CTA bilaterally, no w/r/r. Normal respiratory effort. Abdomen: soft, ntnd Skin: no rash or induration noted Musculoskeletal: grossly normal tone BUE/BLE Psychiatric: grossly normal mood and affect, speech fluent and appropriate Neurologic: grossly non-focal.  Wt Readings from Last 3 Encounters:  04/18/15 64.411 kg (142 lb)  01/10/15 65.409 kg (144 lb 3.2 oz)  11/24/14 64.411 kg (142 lb)    Labs on Admission:  (paper copy from [place], labs 8/1)  INR 2.3 Creatinine 1.26, BUN 19 Hgb 14.3, platelet count 261 WBC 12.3  Radiological Exams on Admission: Barium swallow revealed complete obstruction of the distal esophagus   Principal Problem:   Esophageal obstruction due to food impaction Active Problems:   Chronic anticoagulation   Ventricular aneurysm   GERD with stricture   Peripheral vascular disease   Assessment/Plan 1. Esophageal food impaction, history of esophageal strictures. 2. Vascular disease: S/p aortobifemoral bypass  secondary to saddle embolus, sacroiliac bifurcation 1999, s/p left CEA 2003, left CCA, redo left CEA 2008, h/o occluded right ECA, MI, stroke (right eye blindness), subclavian steal 2003, left subclavian bypass. On warfarin. 3. History of ananterior myocardial infarction complicated by ventricular rupture treated withaneurysmectomy in 1999 as well as a  per Dr. York Spaniel note 10/2004 4. GERD 5. Gout   Admit for observation  Discussed with Dr. Matthias Hughs by telephone, he feels it in patient's best interest to delay endoscopy until INR reversed. To that end, he recommends rapid reversal to expedite EGD impaction removal (now >24 hours) and decrease risk of bleeding/hematoma complications, and also possible dilatation to prevent recurrence.   IVF  Protonix   Resume anticoagulation ASAP when ok with GI  Code Status: DNR DVT prophylaxis: SCDs Family Communication: none present. Patient alert, understands  Disposition Plan/Anticipated LOS: 24 hours  Time spent: 60 minutes  Brendia Sacks, MD  [facility] Pager [phone] 07/12/2015, 12:01 AM

## 2015-07-12 NOTE — Progress Notes (Signed)
Pt discharged to home.  Discharge instructions explained to pt.  Pt has no questions at the time of discharge.  Pt states she has all belongings.  IV removed.  Pt ambulated out of hospital on her own.

## 2015-07-12 NOTE — Progress Notes (Signed)
Patient's endoscopy was well tolerated. The patient had endotracheal intubation for airway protection during the procedure.  There was a small amount of food impacted in the distal esophagus, successfully pushed into the stomach with minimal resistance.  She does have some benign-appearing inflammation and stenosis at the GE junction; please see dictated procedure report for details and recommendations.  Please let me know if you have questions, or if I can be of further assistance in this patient's care.  Florencia Reasons, M.D. Pager [phone] If no answer or after 5 PM call [phone]

## 2015-07-12 NOTE — Discharge Summary (Signed)
Physician Discharge Summary  [name] ZOX:[id number] DOB: 1932/08/24 DOA: 07/11/2015  PCP: Bennie Pierini, FNP  Admit date: 07/11/2015 Discharge date: 07/12/2015  Time spent: 45 minutes  Recommendations for Outpatient Follow-up:  1. Dr.Buccini in 2-3weeks, FU Path from EGD  Discharge Diagnoses:  Principal Problem:   Esophageal obstruction due to food impaction Active Problems:   Chronic anticoagulation   Ventricular aneurysm   GERD with stricture   Peripheral vascular disease   Discharge Condition: Stable  Diet recommendation: Heart healthy, but finally cut and chopped up  [facility] Weights   07/12/15 0208  Weight: 64.4 kg (141 lb 15.6 oz)    History of present illness:  Chief Complaint: food stuck in throat HPI: 83yow presented to [facility] with h/o food impaction since 7/31 PM. Workup at [facility] accompanied patient. Esophagram revealed complete obstruction of distal esophagus by foreign body. Transfer to [facility] was requested and GI Dr. Matthias Hughs agreed to see patient in consult. Patient with h/o esophageal stricture and dilatation in past. Has had trouble swallowing 1 month but getting worse. Ate chicken 7/31 for dinner and could feel it get stuck. Has not been able to eat or drink since. No specific aggravating or alleviating factors.  [facility] Course:  1. Food Impaction:   -She was admitted overnight, started on IV PPI,, warfarin was held and was given 2 units of fresh frozen plasma to reverse her anticoagulation. Subsequently this morning underwent EGD with successful relief of impaction EGD findings are detailed below: 1. Successful relief of food impaction by pushing food in the distal esophagus down into the stomach 2. Mild stenosis at the GE junction, associated with somewhat irregular mucosa which I believe is probably benign granulation tissue; classic reflux esophagitis is not present. 3. Hemorrhagic gastritis -She is symptomatically improved and just  tolerated her lunch postprocedure she will be discharged home on high-dose PPI and will resume her Coumadin tonight . She will follow-up with [facility] for repeat elective endoscopy . -She was given specific instructions on diet by myself and Dr.Buccini  2. 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 Last INR is 1.9. Needs repeat INR check within 1 week   Procedures: ENDOSCOPIC IMPRESSION: 1. Successful relief of food impaction by pushing food in the distal esophagus down into the stomach 2. Mild stenosis at the GE junction, associated with somewhat irregular mucosa which I believe is probably benign granulation tissue; classic reflux esophagitis is not present. 3. Hemorrhagic gastritis   Consultations:  [facility]  Discharge Exam: Filed Vitals:   07/12/15 1140  BP:   Pulse: 80  Temp:   Resp: 15    General: AAOx3 Cardiovascular: S1S2/RRR Respiratory: CTAB  Discharge Instructions   Discharge Instructions    Discharge instructions    Complete by:  As directed   Soft diet, fine cut/chopped up     Increase activity slowly    Complete by:  As directed           Current Discharge Medication List    CONTINUE these medications which have NOT CHANGED   Details  amLODipine (NORVASC) 5 MG tablet Take 1 tablet (5 mg total) by mouth daily. Qty: 30 tablet, Refills: 5  Associated Diagnoses: Hypertensive heart disease without heart failure    atorvastatin (LIPITOR) 40 MG tablet Take 1 tablet (40 mg total) by mouth daily. Qty: 30 tablet, Refills: 5   Associated Diagnoses: Hyperlipidemia    Calcium Carbonate 1500 MG TABS  Take 1 tablet by mouth every morning.    fish oil-omega-3 fatty acids 1000 MG capsule Take 1 g by mouth every morning.     metoprolol (LOPRESSOR) 50 MG tablet Take 1 tablet (50 mg total) by mouth 2 (two) times daily. Qty: 60 tablet, Refills: 5   Associated Diagnoses: Coronary artery disease involving native coronary artery of native heart without angina pectoris    triamterene-hydrochlorothiazide (MAXZIDE-25) 37.5-25 MG per tablet TAKE ONE TABLET BY MOUTH ONE TIME DAILY Qty: 30 tablet, Refills: 2    warfarin (COUMADIN) 4 MG tablet Take 1 tablet (4 mg total) by mouth daily. as directed Qty: 30 tablet, Refills: 5   Associated Diagnoses: Chronic anticoagulation    HYDROcodone-acetaminophen (NORCO/VICODIN) 5-325 MG per tablet Take 1 tablet by mouth every 6 (six) hours as needed. For pain Qty: 60 tablet, Refills: 0   Associated Diagnoses: Chronic back pain       Allergies  Allergen Reactions  . Alendronate Sodium Other (See Comments)    Irritates esophagus and ulcers  . Buffered Aspirin Other (See Comments)    On blood thinners but doctor has started aspirin as stroke therapy  . Ibuprofen Other (See Comments)    Blood thinner therapy  . Prednisone Other (See Comments)    Patient states due to osteoporosis  . Vioxx [Rofecoxib] Other (See Comments)    Unknown by patient   Follow-up Information    Follow up with Bennie Pierini, FNP. Schedule an appointment as soon as possible for a visit in 1 week.   Specialty:  Nurse Practitioner   Contact information:   [address] [place] [address] [phone]        The results of significant diagnostics from this hospitalization (including imaging, microbiology, ancillary and laboratory) are listed below for reference.    Significant Diagnostic Studies: No results found.  Microbiology: No results found for this or any previous visit (from the past 240 hour(s)).   Labs: Basic Metabolic Panel: No results for  input(s): NA, K, CL, CO2, GLUCOSE, BUN, CREATININE, CALCIUM, MG, PHOS in the last 168 hours. Liver Function Tests: No results for input(s): AST, ALT, ALKPHOS, BILITOT, PROT, ALBUMIN in the last 168 hours. No results for input(s): LIPASE, AMYLASE in the last 168 hours. No results for input(s): AMMONIA in the last 168 hours. CBC: No results for input(s): WBC, NEUTROABS, HGB, HCT, MCV, PLT in the last 168 hours. Cardiac Enzymes: No results for input(s): CKTOTAL, CKMB, CKMBINDEX, TROPONINI in the last 168 hours. BNP: BNP (last 3 results) No results for input(s): BNP in the last 8760 hours.  ProBNP (last 3 results) No results for input(s): PROBNP in the last 8760 hours.  CBG: No results for input(s): GLUCAP in the last 168 hours.     SignedZannie Cove  [facility] 07/12/2015, 1:38 PM

## 2015-07-12 NOTE — Progress Notes (Signed)
ANTICOAGULATION CONSULT NOTE - Initial Consult  Pharmacy Consult:  Coumadin Indication:  History of PE and vascular disease  Allergies  Allergen Reactions  . Alendronate Sodium Other (See Comments)    Irritates esophagus and ulcers  . Buffered Aspirin Other (See Comments)    On blood thinners but doctor has started aspirin as stroke therapy  . Ibuprofen Other (See Comments)    Blood thinner therapy  . Prednisone Other (See Comments)    Patient states due to osteoporosis  . Vioxx [Rofecoxib] Other (See Comments)    Unknown by patient    Patient Measurements: Height:  (167.6 cm) Weight: 141 lb 15.6 oz (64.4 kg) IBW/kg (Calculated) : 59.3  Vital Signs: Temp: 98.4 F (36.9 C) (08/02 1115) Temp Source: Oral (08/02 1115) BP: 106/42 mmHg (08/02 1135) Pulse Rate: 80 (08/02 1140)  Labs:  Recent Labs  07/12/15 0013  LABPROT 22.0*  INR 1.93*    CrCl cannot be calculated (Patient has no serum creatinine result on file.).   Medical History: Past Medical History  Diagnosis Date  . Migraines   . Lower back pain     disc   . Osteoporosis   . Hyperlipidemia   . Hypertension   . Urethral stenosis   . Aortic aneurysm   . Cardiac aneurysm     ventrral - bypass   . History of aorto-femoral bypass   . Pancreatitis   . Stricture esophagus     Hx  . GERD (gastroesophageal reflux disease)   . Carotid bruit     bil carotid bruits   . Peptic ulcer     Hx   . Gout   . H/O blood clots   . Carotid artery occlusion   . Myocardial infarction   . Stroke     Right eye stroke      Assessment: 78 YOF with history of PE and vascular disease on Coumadin PTA.  Patient presented with food stuck in her throat, now s/p disimpaction.  Spoke to Dr. Matthias Hughs who recommended resuming Coumadin today.  Patient's INR on admit was 1.93.  No bleeding reported.  There was mentioning of reversing INR; however, I do not see any med given while looking at the patient's MAR.  Home Coumadin  dose:  alternating with  daily, last dose 07/10/15   Goal of Therapy:  INR 2-3    Plan:  - Coumadin  PO today - Daily PT / INR - F/U baseline labs, resume home meds    Jesus Poplin D. Laney Potash, PharmD, BCPS Pager:  [phone] 07/12/2015, 1:36 PM

## 2015-07-12 NOTE — Op Note (Signed)
[facility] [address] [place], [address]   ENDOSCOPY PROCEDURE REPORT  PATIENT: [name], [name]  MR#: [id number] BIRTHDATE: [birth year] , 83  yrs. old GENDER: female ENDOSCOPIST:Allaina Brotzman, MD REFERRED BY: Dr. Vernon Prey PROCEDURE DATE:  07/12/2015 PROCEDURE:   Upper endoscopy with biopsies ASA CLASS:    per anesthesia INDICATIONS: esophageal food impaction of 36 hours duration, in patient with a one-year history of progressive intermittent dysphagia, no weight loss, and a prior history of multiple esophageal dilatations by Dr. Sheryn Bison, most recently about 10 years ago MEDICATION: Gen. endotracheal anesthesia TOPICAL ANESTHETIC:   none  DESCRIPTION OF PROCEDURE:   After the risks and benefits of the procedure were explained, informed consent was obtained.  The patient was brought from her hospital room to the [facility] cone endoscopy unit.  This procedure was done following administration of 2 units of FFP, 2 temporarily reverse the patient's Coumadin anticoagulation.  The Pentax adult video  endoscope was introduced through the mouth and advanced to the second portion of the duodenum .  The instrument was slowly withdrawn as the mucosa was fully examined. Estimated blood loss is zero unless otherwise noted in this procedure report.    The scope was passed under direct vision alongside the patient's endotracheal tube, entering the esophagus without difficulty.   in the distal esophagus, there was a small remaining piece of chicken which was pushed without difficulty into the stomach. This revealed a circumferential narrowing of the distal esophagus at the GE junction, with irregular mucosa most compatible with granulation tissue. There was no discrete mass or eccentric accentuation of this mucosal irregularity to suggest a primary tumor. Nonetheless, multiple biopsies were obtained from this area. There was no tight stenosis; the 10  mm endoscope passed through the area without resistance. However, this abnormal tissue certainly encroached on the esophageal lumen. The extent of this stenosis was approximately 1 cm in longitudinal dimension. There appeared to be a small, 1-2 cm hiatal hernia present.  No reflux esophagitis, varices, or infection were noted in the esophagus itself.  The stomach was entered. It contained a small amount of food debris and a bilious residual which was, for the most part, suctioned up. The stomach had striped erythema and multiple mucosal hemorrhages, consistent with the patient's prolonged fasting state. However, no focal lesions such as erosions, ulcers, polyps, or masses were seen in the stomach.  Retroflexion showed a normal cardia, without any evidence of tumor.  The pylorus was normal, the duodenal bulb was a little bit foreshortened suggestive of possible previous peptic disease, the second duodenum looked normal.  The scope was then withdrawn from the patient and the procedure completed.  COMPLICATIONS: There were no immediate complications.  ENDOSCOPIC IMPRESSION: 1. Successful relief of food impaction by pushing food in the distal esophagus down into the stomach 2. Mild stenosis at the GE junction, associated with somewhat irregular mucosa which I believe is probably benign granulation tissue; classic reflux esophagitis is not present. 3. Hemorrhagic gastritis   RECOMMENDATIONS: 1. Await pathology on today's biopsies 2. Initiate high-dose PPI therapy 3. Okay for the patient to be discharged from overnight observation status to home, resuming her Coumadin tonight 4. We will contact the patient to make arrangements for follow-up with me in the office and probably elective repeat endoscopy in a couple of months for the purpose of esophageal dilatation, if the patient still has residual dysphagia symptoms despite PPI therapy. 5. In the meantime, the patient needs  to  eat extremely cautiously. I have given detailed instructions to the patient and her niece, Eunice Blase, concerning this. Specifically, she should not swallow any particle of food larger than a pea, and she should have small boluses of food interspersed with swallows of liquid to wash the food down the esophagus into the stomach.   _______________________________ eSigned:  Bernette Redbird, MD 08-03-15 11:30 AM     cc:  CPT CODES: ICD CODES:  The ICD and CPT codes recommended by this software are interpretations from the data that the clinical staff has captured with the software.  The verification of the translation of this report to the ICD and CPT codes and modifiers is the sole responsibility of the health care institution and practicing physician where this report was generated.  [employer] will not be held responsible for the validity of the ICD and CPT codes included on this report.  AMA assumes no liability for data contained or not contained herein. CPT is a Publishing rights manager of the [employer].  PATIENT NAME:  [name], [name] MR#: [id number]

## 2015-07-12 NOTE — Transfer of Care (Signed)
Immediate Anesthesia Transfer of Care Note  Patient: [name]  Procedure(s) Performed: Procedure(s) with comments: ESOPHAGOGASTRODUODENOSCOPY (EGD) WITH PROPOFOL with possible dilatation (N/A) - Consideration of endotracheal intubation for airway protection  Patient Location: Endoscopy Unit  Anesthesia Type:General  Level of Consciousness: awake, alert  and oriented  Airway & Oxygen Therapy: Patient Spontanous Breathing and Patient connected to nasal cannula oxygen  Post-op Assessment: Report given to RN, Post -op Vital signs reviewed and stable and Patient moving all extremities X 4  Post vital signs: Reviewed and stable  Last Vitals:  Filed Vitals:   07/12/15 0943  BP: 133/99  Pulse:   Temp: 37.1 C  Resp: 15    Complications: No apparent anesthesia complications

## 2015-07-12 NOTE — Anesthesia Postprocedure Evaluation (Signed)
  Anesthesia Post-op Note  Patient: [name]  Procedure(s) Performed: Procedure(s) (LRB): ESOPHAGOGASTRODUODENOSCOPY (EGD) WITH PROPOFOL with possible dilatation (N/A)  Patient Location: PACU  Anesthesia Type: MAC  Level of Consciousness: awake and alert   Airway and Oxygen Therapy: Patient Spontanous Breathing  Post-op Pain: mild  Post-op Assessment: Post-op Vital signs reviewed, Patient's Cardiovascular Status Stable, Respiratory Function Stable, Patent Airway and No signs of Nausea or vomiting  Last Vitals:  Filed Vitals:   07/12/15 1430  BP: 137/50  Pulse: 89  Temp:   Resp: 16    Post-op Vital Signs: stable   Complications: No apparent anesthesia complications

## 2015-07-12 NOTE — Anesthesia Preprocedure Evaluation (Addendum)
Anesthesia Evaluation  Patient identified by MRN, date of birth, ID band Patient awake    Reviewed: Allergy & Precautions, H&P , NPO status , Patient's Chart, lab work & pertinent test results  History of Anesthesia Complications Negative for: history of anesthetic complications  Airway Mallampati: II  TM Distance: >3 FB Neck ROM: full    Dental no notable dental hx.    Pulmonary COPDformer smoker,  breath sounds clear to auscultation  Pulmonary exam normal       Cardiovascular hypertension, + Peripheral Vascular Disease negative cardio ROS Normal cardiovascular examRhythm:regular Rate:Normal     Neuro/Psych TIACVA negative psych ROS   GI/Hepatic Neg liver ROS,   Endo/Other  negative endocrine ROS  Renal/GU negative Renal ROS     Musculoskeletal   Abdominal   Peds  Hematology negative hematology ROS (+)   Anesthesia Other Findings Significant PVD history with aorto-bifem bypass, multiple CVA events with CEA, CABG as well.. However all is currently stable, METS >4 and has no syncope, no chest pain, no chest pressure  03/28/15 Echo - EF 60%, mod AS, mild MR  Patient on coumadin, therapeutic given significant PVD.[name] FFP given at request of GI surgery to safely perform procedure   Reproductive/Obstetrics negative OB ROS                          Anesthesia Physical Anesthesia Plan  ASA: III  Anesthesia Plan: MAC   Post-op Pain Management:    Induction:   Airway Management Planned: Natural Airway  Additional Equipment:   Intra-op Plan:   Post-operative Plan:   Informed Consent: I have reviewed the patients History and Physical, chart, labs and discussed the procedure including the risks, benefits and alternatives for the proposed anesthesia with the patient or authorized representative who has indicated his/her understanding and acceptance.   Dental Advisory Given  Plan Discussed  with: Anesthesiologist and CRNA  Anesthesia Plan Comments: (Propofol gtt, phenylephrine support as needed)       Anesthesia Quick Evaluation

## 2015-07-12 NOTE — Anesthesia Procedure Notes (Signed)
Procedure Name: Intubation Date/Time: 07/12/2015 10:44 AM Performed by: Rise Patience T Pre-anesthesia Checklist: Patient identified, Emergency Drugs available, Suction available and Patient being monitored Patient Re-evaluated:Patient Re-evaluated prior to inductionOxygen Delivery Method: Circle system utilized Preoxygenation: Pre-oxygenation with 100% oxygen Intubation Type: IV induction and Rapid sequence Laryngoscope Size: Miller and 2 Grade View: Grade I Tube type: Oral Tube size: 7.5 mm Number of attempts: 1 Airway Equipment and Method: Stylet Placement Confirmation: ETT inserted through vocal cords under direct vision,  positive ETCO2 and breath sounds checked- equal and bilateral Secured at: 21 cm Tube secured with: Tape Dental Injury: Teeth and Oropharynx as per pre-operative assessment

## 2015-07-12 NOTE — Progress Notes (Signed)
Pt seen and examined, continues to have Upper Abd discomfort from suspected food impaction,  Vascular disease and thrombosis on warfarin. EGD today, PPI Resume anticoagulation when cleared by GI for this  Zannie Cove, MD [phone]

## 2015-07-13 ENCOUNTER — Encounter (HOSPITAL_COMMUNITY): Payer: Self-pay | Admitting: Gastroenterology

## 2015-07-13 LAB — PREPARE FRESH FROZEN PLASMA
UNIT DIVISION: 0
Unit division: 0

## 2015-07-22 ENCOUNTER — Encounter: Payer: Self-pay | Admitting: Nurse Practitioner

## 2015-07-22 ENCOUNTER — Ambulatory Visit (INDEPENDENT_AMBULATORY_CARE_PROVIDER_SITE_OTHER): Payer: Medicare Other | Admitting: Nurse Practitioner

## 2015-07-22 VITALS — BP 136/72 | HR 74 | Temp 97.9°F | Ht 66.0 in | Wt 143.0 lb

## 2015-07-22 DIAGNOSIS — Z87821 Personal history of retained foreign body fully removed: Secondary | ICD-10-CM

## 2015-07-22 DIAGNOSIS — G8929 Other chronic pain: Secondary | ICD-10-CM | POA: Diagnosis not present

## 2015-07-22 DIAGNOSIS — I2510 Atherosclerotic heart disease of native coronary artery without angina pectoris: Secondary | ICD-10-CM

## 2015-07-22 DIAGNOSIS — Z09 Encounter for follow-up examination after completed treatment for conditions other than malignant neoplasm: Secondary | ICD-10-CM

## 2015-07-22 DIAGNOSIS — M549 Dorsalgia, unspecified: Secondary | ICD-10-CM | POA: Diagnosis not present

## 2015-07-22 MED ORDER — HYDROCODONE-ACETAMINOPHEN 5-325 MG PO TABS: 1.0000 | ORAL_TABLET | Freq: Four times a day (QID) | ORAL | Status: DC | PRN

## 2015-07-22 NOTE — Progress Notes (Signed)
   Subjective:    Patient ID: [name], female    DOB: 1931/12/15, 81 y.o.   MRN: [id number]  HPI Patient in for follow up- she came in and said that she had something hung in her throat- she eventually went to ER- had a piece of chicken stuck in her throat- they had to go in and remove it. SHe is doing better but needs esophagus stretched. Is scheduled to have stretched next week.    Review of Systems  Constitutional: Negative.   HENT: Negative.   Respiratory: Negative.   Cardiovascular: Negative.   Genitourinary: Negative.   Neurological: Negative.   Psychiatric/Behavioral: Negative.   All other systems reviewed and are negative.      Objective:   Physical Exam  Constitutional: She is oriented to person, place, and time. She appears well-developed and well-nourished.  HENT:  Right Ear: External ear normal.  Left Ear: External ear normal.  Nose: Nose normal.  Mouth/Throat: Oropharynx is clear and moist.  Cardiovascular: Normal rate, regular rhythm and normal heart sounds.   Pulmonary/Chest: Effort normal and breath sounds normal.  Abdominal: Soft. Bowel sounds are normal.  Neurological: She is alert and oriented to person, place, and time.  Skin: Skin is warm and dry.  Psychiatric: She has a normal mood and affect. Her behavior is normal. Thought content normal.   BP 136/72 mmHg  Pulse 74  Temp(Src) 97.9 F (36.6 C) (Oral)  Ht  (1.676 m)  Wt 143 lb (64.864 kg)  BMI 23.09 kg/m2        Assessment & Plan:  1. Hospital discharge follow-up 2. Hx of retained foreign body fully removed Hospital records reviewed Cleared for esophagus stretching  3. Chronic back pain No heavy lifting rest - HYDROcodone-acetaminophen (NORCO/VICODIN) 5-325 MG per tablet; Take 1 tablet by mouth every 6 (six) hours as needed. For pain  Dispense: 60 tablet; Refill: 0   Mary-Cythina Daphine Deutscher, FNP

## 2015-07-22 NOTE — Patient Instructions (Signed)
Esophageal Stricture °The esophagus is the long, narrow tube which carries food and liquid from the mouth to the stomach. Sometimes a part of the esophagus becomes narrow and makes it difficult, painful, or even impossible to swallow. This is called an esophageal stricture.  °CAUSES  °Common causes of blockage or strictures of the esophagus are: °· Exposure of the lower esophagus to the acid from the stomach may cause narrowing. °· Hiatal hernia in which a small part of the stomach bulges up through the diaphragm can cause a narrowing in the bottom of the esophagus. °· Scleroderma is a tissue disorder that affects the esophagus and makes swallowing difficult. °· Achalasia is an absence of nerves in the lower esophagus and to the esophageal sphincter. This absence of nerves may be congenital (present since birth). This can cause irregular spasms which do not allow food and fluid through. °· Strictures may develop from swallowing materials which damage the esophagus. Examples are acids or alkalis such as lye. °· Schatzki's Ring is a narrow ring of non-cancerous tissue which narrows the lower esophagus. The cause of this is unknown. °· Growths can block the esophagus. °SYMPTOMS  °Some of the problems are difficulty swallowing or pain with swallowing. °DIAGNOSIS  °Your caregiver often suspects this problem by taking a medical history. They will also do a physical exam. They may then take X-rays and/or perform an endoscopy. Endoscopy is an exam in which a tube like a small flexible telescope is used to look at your esophagus.  °TREATMENT °· One form of treatment is to dilate the narrow area. This means to stretch it. °· When this is not successful, chest surgery may be required. This is a much more extensive form of treatment with a longer recovery time. °Both of the above treatments make the passage of food and water into the stomach easier. They also make it easier for stomach contents to bubble back into the  esophagus. Special medications may be used following the procedure to help prevent further narrowing. Medications may be used to lower the amount of acid in the stomach juice.  °SEEK IMMEDIATE MEDICAL CARE IF:  °· Your swallowing is becoming more painful, difficult, or you are unable to swallow. °· You vomit up blood. °· You develop black tarry stools. °· You develop chills. °· You have a fever. °· You develop chest or abdominal pain. °· You develop shortness of breath, feel lightheaded, or faint. °Follow up with medical care as your caregiver suggests. °Document Released: 08/06/2006 Document Revised: 02/18/2012 Document Reviewed: 04/07/2014 °ExitCare® Patient Information ©2015 [employer]. This information is not intended to replace advice given to you by your health care provider. Make sure you discuss any questions you have with your health care provider. ° °

## 2015-08-08 ENCOUNTER — Other Ambulatory Visit: Payer: Self-pay | Admitting: Gastroenterology

## 2015-08-08 ENCOUNTER — Telehealth: Payer: Self-pay | Admitting: Nurse Practitioner

## 2015-08-08 DIAGNOSIS — R1310 Dysphagia, unspecified: Secondary | ICD-10-CM | POA: Diagnosis not present

## 2015-08-08 DIAGNOSIS — K222 Esophageal obstruction: Secondary | ICD-10-CM | POA: Diagnosis not present

## 2015-08-09 ENCOUNTER — Encounter: Payer: Medicare Other | Admitting: Pharmacist Clinician (PhC)/ Clinical Pharmacy Specialist

## 2015-08-10 ENCOUNTER — Telehealth: Payer: Self-pay

## 2015-08-10 ENCOUNTER — Encounter (INDEPENDENT_AMBULATORY_CARE_PROVIDER_SITE_OTHER): Payer: Self-pay

## 2015-08-10 ENCOUNTER — Ambulatory Visit (INDEPENDENT_AMBULATORY_CARE_PROVIDER_SITE_OTHER): Payer: Medicare Other | Admitting: Pharmacist

## 2015-08-10 DIAGNOSIS — I714 Abdominal aortic aneurysm, without rupture, unspecified: Secondary | ICD-10-CM

## 2015-08-10 DIAGNOSIS — I718 Aortic aneurysm of unspecified site, ruptured: Secondary | ICD-10-CM | POA: Diagnosis not present

## 2015-08-10 LAB — POCT INR: INR: 3.9

## 2015-08-10 NOTE — Patient Instructions (Signed)
Anticoagulation Dose Instructions as of 08/10/2015      [name] Tue Wed Thu Fri Sat   New Dose 2 mg 4 mg 2 mg 4 mg 2 mg 4 mg 2 mg    Description        Skip next two dose of warfarin - Thursday, September 1st and Friday, September 2nd then decrease dose to warfarin  tablets - take 1/2 tablet daily except take 1 tablet mondays, wednesdays and fridays.       INR was 3.9 today

## 2015-08-10 NOTE — Telephone Encounter (Signed)
Dr Matthias Hughs wants to know why -patient is on Coumadin and if he has to do a esophageal dilatation can she be off of it for 1 wk?

## 2015-08-10 NOTE — Telephone Encounter (Signed)
Patient has PVD, history of 2 strokes- is ok  To come off 3 days prior and start back after procedure- come in 1 week after procedure so we can check INR

## 2015-08-11 ENCOUNTER — Ambulatory Visit
Admission: RE | Admit: 2015-08-11 | Discharge: 2015-08-11 | Disposition: A | Discharge status: Home / Self Care | Payer: Medicare Other | Source: Ambulatory Visit | Attending: Gastroenterology | Admitting: Gastroenterology

## 2015-08-11 DIAGNOSIS — R1310 Dysphagia, unspecified: Secondary | ICD-10-CM

## 2015-08-11 DIAGNOSIS — K222 Esophageal obstruction: Secondary | ICD-10-CM | POA: Diagnosis not present

## 2015-08-17 NOTE — Telephone Encounter (Signed)
Call handled in another telephone encounter  

## 2015-08-25 ENCOUNTER — Ambulatory Visit (INDEPENDENT_AMBULATORY_CARE_PROVIDER_SITE_OTHER): Payer: Medicare Other | Admitting: Pharmacist

## 2015-08-25 ENCOUNTER — Encounter: Payer: Self-pay | Admitting: Pharmacist

## 2015-08-25 VITALS — BP 132/62 | HR 70 | Ht 64.0 in | Wt 143.5 lb

## 2015-08-25 DIAGNOSIS — R7309 Other abnormal glucose: Secondary | ICD-10-CM

## 2015-08-25 DIAGNOSIS — Z0000 Encounter for general adult medical examination without abnormal findings: Secondary | ICD-10-CM | POA: Diagnosis not present

## 2015-08-25 DIAGNOSIS — I714 Abdominal aortic aneurysm, without rupture, unspecified: Secondary | ICD-10-CM

## 2015-08-25 DIAGNOSIS — E8881 Metabolic syndrome: Secondary | ICD-10-CM | POA: Insufficient documentation

## 2015-08-25 DIAGNOSIS — I718 Aortic aneurysm of unspecified site, ruptured: Secondary | ICD-10-CM

## 2015-08-25 LAB — POCT INR: INR: 2.2

## 2015-08-25 NOTE — Patient Instructions (Addendum)
[name] , Thank you for taking time to come for your Medicare Wellness Visit. I appreciate your ongoing commitment to your health goals. Please review the following plan we discussed and let me know if I can assist you in the future.   These are the goals we discussed:  Try to increase physical activity / exercise - walking or Silver Sneaker program.  Also can do chair exercises at home to help with balance and muscle strength.  Increase non-starchy vegetables - carrots, green bean, squash, zucchini, tomatoes, onions, peppers, spinach and other green leafy vegetables, cabbage, lettuce, cucumbers, asparagus, okra (not fried), eggplant limit sugar and processed foods (cakes, cookies, ice cream, crackers and chips) Increase fresh fruit but limit serving sizes 1/2 cup or about the size of tennis or baseball limit red meat to no more than 1-2 times per week (serving size about the size of your palm) Choose whole grains / lean proteins - whole wheat bread, quinoa, whole grain rice (1/2 cup), fish, chicken, [place]   This is a list of the screening recommended for you and due dates:  Health Maintenance  Topic Date Due  . DEXA scan (bone density measurement)  Due now  . Flu Shot  07/11/2015  . Colon Cancer Screening  Given today  . Shingles Vaccine  completed  . Pneumonia vaccines (2 of 2 - PPSV23) 04/17/2016  . Tetanus Vaccine  12/10/2018  *Topic was postponed. The date shown is not the original due date.     Anticoagulation Dose Instructions as of 08/25/2015      [name] Tue Wed Thu Fri Sat   New Dose 2 mg 4 mg 2 mg 4 mg 2 mg 4 mg 2 mg    Description        Continue current dose of warfarin 4mg  tablets - take 1/2 tablet daily except take 1 tablet mondays, wednesdays and fridays.       INR was 2.2 today Fall Prevention and Home Safety Falls cause injuries and can affect all age groups. It is possible to use preventive measures to significantly decrease the likelihood of falls. There  are many simple measures which can make your home safer and prevent falls. OUTDOORS  Repair cracks and edges of walkways and driveways.  Remove high doorway thresholds.  Trim shrubbery on the main path into your home.  Have good outside lighting.  Clear walkways of tools, rocks, debris, and clutter.  Check that handrails are not broken and are securely fastened. Both sides of steps should have handrails.  Have leaves, snow, and ice cleared regularly.  Use sand or salt on walkways during winter months.  In the garage, clean up grease or oil spills. BATHROOM  Install night lights.  Install grab bars by the toilet and in the tub and shower.  Use non-skid mats or decals in the tub or shower.  Place a plastic non-slip stool in the shower to sit on, if needed.  Keep floors dry and clean up all water on the floor immediately.  Remove soap buildup in the tub or shower on a regular basis.  Secure bath mats with non-slip, double-sided rug tape.  Remove throw rugs and tripping hazards from the floors. BEDROOMS  Install night lights.  Make sure a bedside light is easy to reach.  Do not use oversized bedding.  Keep a telephone by your bedside.  Have a firm chair with side arms to use for getting dressed.  Remove throw rugs and tripping hazards  from the floor. KITCHEN  Keep handles on pots and pans turned toward the center of the stove. Use back burners when possible.  Clean up spills quickly and allow time for drying.  Avoid walking on wet floors.  Avoid hot utensils and knives.  Position shelves so they are not too high or low.  Place commonly used objects within easy reach.  If necessary, use a sturdy step stool with a grab bar when reaching.  Keep electrical cables out of the way.  Do not use floor polish or wax that makes floors slippery. If you must use wax, use non-skid floor wax.  Remove throw rugs and tripping hazards from the  floor. STAIRWAYS  Never leave objects on stairs.  Place handrails on both sides of stairways and use them. Fix any loose handrails. Make sure handrails on both sides of the stairways are as long as the stairs.  Check carpeting to make sure it is firmly attached along stairs. Make repairs to worn or loose carpet promptly.  Avoid placing throw rugs at the top or bottom of stairways, or properly secure the rug with carpet tape to prevent slippage. Get rid of throw rugs, if possible.  Have an electrician put in a light switch at the top and bottom of the stairs. OTHER FALL PREVENTION TIPS  Wear low-heel or rubber-soled shoes that are supportive and fit well. Wear closed toe shoes.  When using a stepladder, make sure it is fully opened and both spreaders are firmly locked. Do not climb a closed stepladder.  Add color or contrast paint or tape to grab bars and handrails in your home. Place contrasting color strips on first and last steps.  Learn and use mobility aids as needed. Install an electrical emergency response system.  Turn on lights to avoid dark areas. Replace light bulbs that burn out immediately. Get light switches that glow.  Arrange furniture to create clear pathways. Keep furniture in the same place.  Firmly attach carpet with non-skid or double-sided tape.  Eliminate uneven floor surfaces.  Select a carpet pattern that does not visually hide the edge of steps.  Be aware of all pets. OTHER HOME SAFETY TIPS  Set the water temperature for 120 F (48.8 C).  Keep emergency numbers on or near the telephone.  Keep smoke detectors on every level of the home and near sleeping areas. Document Released: 11/16/2002 Document Revised: 05/27/2012 Document Reviewed: 02/15/2012 [facility] Patient Information 2015 [place], [place]. This information is not intended to replace advice given to you by your health care provider. Make sure you discuss any questions you have with your health  care provider.

## 2015-08-25 NOTE — Progress Notes (Signed)
Patient ID: [name], female   DOB: [birth year], 81 y.o.   MRN: [id number]    Subjective:   [name] is a 81 y.o. female who presents for an Initial Medicare Annual Wellness Visit and recheck protime  [name] is widowed for 18 months.  Her husband died of cancer in 12/25/2013.  She still has her home and lives there during the day but she goes to her sister in laws at night.  She does not have any health related compliants today.   She is due to have INR rechecked and her last FBG and triglycerides were elevated when last checked.    Current Medications (verified) Outpatient Encounter Prescriptions as of 08/25/2015  Medication Sig  . amLODipine (NORVASC) 5 MG tablet Take 1 tablet (5 mg total) by mouth daily.  [name] atorvastatin (LIPITOR) 40 MG tablet Take 1 tablet (40 mg total) by mouth daily.  [name] HYDROcodone-acetaminophen (NORCO/VICODIN) 5-325 MG per tablet Take 1 tablet by mouth every 6 (six) hours as needed. For pain  . metoprolol (LOPRESSOR) 50 MG tablet Take 1 tablet (50 mg total) by mouth 2 (two) times daily.  . pantoprazole (PROTONIX) 40 MG tablet Take 1 tablet (40 mg total) by mouth daily.  [name] triamterene-hydrochlorothiazide (MAXZIDE-25) 37.5-25 MG per tablet TAKE ONE TABLET BY MOUTH ONE TIME DAILY  . warfarin (COUMADIN) 4 MG tablet Take 1 tablet (4 mg total) by mouth daily. as directed (Patient taking differently: Take 2-4 mg by mouth See admin instructions. Alternates taking 1 tablet one day then 1/2 tablet the next day.)   No facility-administered encounter medications on file as of 08/25/2015.    Allergies (verified) Alendronate sodium; Buffered aspirin; Ibuprofen; Prednisone; and Vioxx   History: Past Medical History  Diagnosis Date  . Migraines   . Lower back pain     disc   . Osteoporosis   . Hyperlipidemia   . Hypertension   . Urethral stenosis   . Aortic aneurysm   . Cardiac aneurysm     ventrral - bypass   . History of aorto-femoral bypass   .  Pancreatitis   . Stricture esophagus     Hx  . GERD (gastroesophageal reflux disease)   . Carotid bruit     bil carotid bruits   . Peptic ulcer     Hx   . Gout   . H/O blood clots   . Carotid artery occlusion   . Myocardial infarction   . Stroke     Right eye stroke  . Cataract   . 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 Esophageal dilation  1996  . Lumbar disc surgery  1993  . Partial hysterectomy  1966    single oopherectomy (right)   . Aortic & cardiac aneurysm rupture  04/1998  . Aorta - bilateral femoral artery bypass graft  7/99    Dr. Madilyn Fireman   . Carotid endarterectomy  July 2003    Left  . Carotid endarterectomy  11/18/2007    Left  . Coronary artery bypass graft      16 yrs ago  . Arch aortogram N/A 11/17/2012    Procedure: ARCH AORTOGRAM;  Surgeon: Chuck Hint, MD;  Location: [facility] CATH LAB;  Service: Cardiovascular;  Laterality: N/A;  . Appendectomy    . Stomach surgery      ruptured ulcer  . Esophagogastroduodenoscopy (egd) with propofol N/A 07/12/2015  Procedure: ESOPHAGOGASTRODUODENOSCOPY (EGD) WITH PROPOFOL with possible dilatation;  Surgeon: Bernette Redbird, MD;  Location: [facility] ENDOSCOPY;  Service: Endoscopy;  Laterality: N/A;  Consideration of endotracheal intubation for airway protection  . Eye surgery    . Abdominal hysterectomy     Family History  Problem Relation Age of Onset  . CAD Father     died age 21+  . Heart disease Father     Older than 60  . Heart attack Father   . Osteoarthritis Sister     paraplegic due to disk rupture  . CAD Sister   . Arthritis Sister   . Colon cancer Neg Hx   . Varicose Veins Mother    Social History   Occupational History  . retired     [name] in [place], [place]   Social History Main Topics  . Smoking status: Former Smoker    Quit date: 04/06/1998  . Smokeless tobacco: Never Used  . Alcohol Use: No  . Drug Use: No  .  Sexual Activity: No    Do you feel safe at home?  Yes - during day but not at night.  Patient sleeps at her sister in laws house.   Dietary issues and exercise activities: Current Exercise Habits:: The patient does not participate in regular exercise at present  Current Dietary habits:  Not following any particular diet. She does state that she does not cook as much as it is just her at home.  She eats more frozen meals.   Objective:    Today's Vitals   08/25/15 1234  BP: 132/62  Pulse: 70  Height: 5\' 4"  (1.626 m)  Weight: 143 lb 8 oz (65.091 kg)  PainSc: 0-No pain   Body mass index is 24.62 kg/(m^2).   INR = 2.2 today  Activities of Daily Living In your present state of health, do you have any difficulty performing the following activities: 08/25/2015 01/10/2015  Hearing? N N  Vision? Y N  Difficulty concentrating or making decisions? N N  Walking or climbing stairs? N N  Dressing or bathing? N N  Doing errands, shopping? N N  Preparing Food and eating ? N -  Using the Toilet? N -  In the past six months, have you accidently leaked urine? N -  Do you have problems with loss of bowel control? N -  Managing your Medications? N -  Managing your Finances? N -  Housekeeping or managing your Housekeeping? N -    Are there smokers in your home (other than you)? No   Cardiac Risk Factors include: advanced age (>54men, >84 women);dyslipidemia;hypertension;sedentary lifestyle  Depression Screen PHQ 2/9 Scores 08/25/2015 04/18/2015 01/10/2015 10/07/2014  PHQ - 2 Score 1 1 2  0  PHQ- 9 Score - - 5 -    Fall Risk Fall Risk  08/25/2015 04/18/2015 01/10/2015 10/07/2014 04/30/2014  Falls in the past year? No No No No No    Cognitive Function: MMSE - Mini Mental State Exam 08/25/2015  Orientation to time 5  Orientation to Place 5  Registration 3  Attention/ Calculation 5  Recall 2  Language- name 2 objects 2  Language- repeat 1  Language- follow 3 step command 3  Language- read &  follow direction 1  Write a sentence 1  Copy design 0  Total score 28    Immunizations and Health Maintenance Immunization History  Administered Date(s) Administered  . Influenza, High Dose Seasonal PF 08/21/2014   Health Maintenance Due  Topic Date Due  .  DEXA SCAN  07/06/1999  . INFLUENZA VACCINE  07/11/2015    Patient Care Team: Bennie Pierini, FNP as PCP - General (Nurse Practitioner) Mary-Janylah Daphine Deutscher, FNP as Nurse Practitioner (Nurse Practitioner) Susa Simmonds, MD as Consulting Physician (Ophthalmology) Othella Boyer, MD as Consulting Physician (Cardiology) Bernette Redbird, MD as Consulting Physician (Gastroenterology)  Indicate any recent Medical Services you may have received from other than [facility] providers in the past year (date may be approximate).    Assessment:    Annual Wellness Visit    Screening Tests Health Maintenance  Topic Date Due  . DEXA SCAN  07/06/1999  . INFLUENZA VACCINE  07/11/2015  . COLONOSCOPY  10/19/2015 (Originally 12/10/2012)  . ZOSTAVAX  10/19/2015 (Originally 07/05/1994)  . PNA vac Low Risk Adult (2 of 2 - PPSV23) 04/17/2016  . TETANUS/TDAP  12/10/2018        Plan:   During the course of the visit Yanai was educated and counseled about the following appropriate screening and preventive services:   Vaccines to include Pneumoccal, Influenza, Hepatitis B, Td, Zostavax - patient believes that she has had Pre[facility]ar [facility] at [facility] - will follow up with them about administration as [facility] did not show any record of pneumonia vaccines.    Colorectal cancer screening - no requirement / need for colonoscopy currently.  Patient did take FOBT home for evaluation  Cardiovascular disease screening - UTD.  BP was improved today compare to last visit when BP was 154/82. Continue current medications  Diabetes screening - last FBG wa elevated. Ordered A1c today.  Bone Denisty / Osteoporosis Screening - patient declined  Mammogram -  patient declined  Glaucoma screening /  Eye Exam - UTD  Nutrition counseling - discussed limiting CHO intake to help with BG and elevated triglycerides.  Recommended try single serve frozen vegetables.  Advanced Directives - UTD  Increase physical acivity - given handout about chair exercises.  Anticoagulation Dose Instructions as of 08/25/2015      [name] Tue Wed Thu Fri Sat   New Dose 2 mg 4 mg 2 mg 4 mg 2 mg 4 mg 2 mg    Description        Continue current dose of warfarin  tablets - take 1/2 tablet daily except take 1 tablet mondays, wednesdays and fridays.       Recheck Protime / INR in 4 weeks   Patient Instructions (the written plan) were given to the patient.   Henrene Pastor, [facility]   08/25/2015

## 2015-09-12 DIAGNOSIS — I349 Nonrheumatic mitral valve disorder, unspecified: Secondary | ICD-10-CM | POA: Diagnosis not present

## 2015-09-12 DIAGNOSIS — I739 Peripheral vascular disease, unspecified: Secondary | ICD-10-CM | POA: Diagnosis not present

## 2015-09-12 DIAGNOSIS — J449 Chronic obstructive pulmonary disease, unspecified: Secondary | ICD-10-CM | POA: Diagnosis not present

## 2015-09-12 DIAGNOSIS — I252 Old myocardial infarction: Secondary | ICD-10-CM | POA: Diagnosis not present

## 2015-09-12 DIAGNOSIS — I350 Nonrheumatic aortic (valve) stenosis: Secondary | ICD-10-CM | POA: Diagnosis not present

## 2015-09-12 DIAGNOSIS — I2510 Atherosclerotic heart disease of native coronary artery without angina pectoris: Secondary | ICD-10-CM | POA: Diagnosis not present

## 2015-09-12 DIAGNOSIS — E785 Hyperlipidemia, unspecified: Secondary | ICD-10-CM | POA: Diagnosis not present

## 2015-09-12 DIAGNOSIS — I119 Hypertensive heart disease without heart failure: Secondary | ICD-10-CM | POA: Diagnosis not present

## 2015-09-27 ENCOUNTER — Ambulatory Visit (INDEPENDENT_AMBULATORY_CARE_PROVIDER_SITE_OTHER): Payer: Medicare Other | Admitting: Pharmacist Clinician (PhC)/ Clinical Pharmacy Specialist

## 2015-09-27 DIAGNOSIS — I714 Abdominal aortic aneurysm, without rupture, unspecified: Secondary | ICD-10-CM

## 2015-09-27 DIAGNOSIS — Z23 Encounter for immunization: Secondary | ICD-10-CM

## 2015-09-27 DIAGNOSIS — I718 Aortic aneurysm of unspecified site, ruptured: Secondary | ICD-10-CM

## 2015-09-27 LAB — POCT INR: INR: 2.5

## 2015-10-04 DIAGNOSIS — R1314 Dysphagia, pharyngoesophageal phase: Secondary | ICD-10-CM | POA: Diagnosis not present

## 2015-10-17 ENCOUNTER — Other Ambulatory Visit: Payer: Self-pay | Admitting: Nurse Practitioner

## 2015-11-11 ENCOUNTER — Ambulatory Visit (INDEPENDENT_AMBULATORY_CARE_PROVIDER_SITE_OTHER): Payer: Medicare Other | Admitting: Nurse Practitioner

## 2015-11-11 ENCOUNTER — Encounter: Payer: Self-pay | Admitting: Nurse Practitioner

## 2015-11-11 VITALS — BP 122/62 | HR 77 | Temp 97.2°F | Ht 64.0 in | Wt 144.0 lb

## 2015-11-11 DIAGNOSIS — M545 Low back pain, unspecified: Secondary | ICD-10-CM

## 2015-11-11 DIAGNOSIS — G8929 Other chronic pain: Secondary | ICD-10-CM

## 2015-11-11 DIAGNOSIS — M25512 Pain in left shoulder: Secondary | ICD-10-CM | POA: Diagnosis not present

## 2015-11-11 DIAGNOSIS — M549 Dorsalgia, unspecified: Secondary | ICD-10-CM

## 2015-11-11 DIAGNOSIS — I2510 Atherosclerotic heart disease of native coronary artery without angina pectoris: Secondary | ICD-10-CM | POA: Diagnosis not present

## 2015-11-11 MED ORDER — HYDROCODONE-ACETAMINOPHEN 5-325 MG PO TABS: 1.0000 | ORAL_TABLET | Freq: Four times a day (QID) | ORAL | Status: DC | PRN

## 2015-11-11 NOTE — Patient Instructions (Signed)

## 2015-11-11 NOTE — Progress Notes (Signed)
   Subjective:    Patient ID: [name], female    DOB: [birth year], 81 y.o.   MRN: [id number]  HPI Patient in today c/o of back and shoulder pain- flares up frequently- has ran out of pain pills. Shots usually do not help. Rates shoulder pain 7/10 intermittently- increases with constant movement. Rates back pain 7/10 and increases with lots of walking or standing. Denies any lower ext weakness.   Review of Systems  Constitutional: Negative.   HENT: Negative.   Respiratory: Negative.   Cardiovascular: Negative.   Genitourinary: Negative.   Neurological: Negative.   Psychiatric/Behavioral: Negative.   All other systems reviewed and are negative.      Objective:   Physical Exam  Constitutional: She appears well-developed and well-nourished.  Cardiovascular: Normal rate, regular rhythm and normal heart sounds.   Pulmonary/Chest: Effort normal and breath sounds normal.  Musculoskeletal:  FROM of left shoulder with pain on full abduction. FROM of lumbar spine with pain of full flexion and extension. Motor strength and sensation of all extremities intact  Neurological: She is alert.  Skin: Skin is warm.  Psychiatric: She has a normal mood and affect. Her behavior is normal. Judgment and thought content normal.   BP 122/62 mmHg  Pulse 77  Temp(Src) 97.2 F (36.2 C) (Oral)  Ht 5\' 4"  (1.626 m)  Wt 144 lb (65.318 kg)  BMI 24.71 kg/m2        Assessment & Plan:   1. Left shoulder pain   2. Bilateral low back pain without sciatica   3. Chronic back pain    Meds ordered this encounter  Medications  . HYDROcodone-acetaminophen (NORCO/VICODIN) 5-325 MG tablet    Sig: Take 1 tablet by mouth every 6 (six) hours as needed. For pain    Dispense:  60 tablet    Refill:  0    Order Specific Question:  Supervising Provider    Answer:  Ernestina PennaMOORE, DONALD W [[id number]]   Moist heat  Rest RTO prn  Mary-Aiesha Daphine DeutscherMartin, FNP

## 2015-11-15 ENCOUNTER — Ambulatory Visit (INDEPENDENT_AMBULATORY_CARE_PROVIDER_SITE_OTHER): Payer: Medicare Other | Admitting: Pharmacist Clinician (PhC)/ Clinical Pharmacy Specialist

## 2015-11-15 DIAGNOSIS — I718 Aortic aneurysm of unspecified site, ruptured: Secondary | ICD-10-CM | POA: Diagnosis not present

## 2015-11-15 DIAGNOSIS — I714 Abdominal aortic aneurysm, without rupture, unspecified: Secondary | ICD-10-CM

## 2015-11-15 LAB — POCT INR: INR: 2.9

## 2015-11-15 NOTE — Patient Instructions (Signed)
Anticoagulation Dose Instructions as of 11/15/2015      Maria SmilesSun Mon Tue Wed Thu Fri Sat   New Dose 2 mg 4 mg 2 mg 4 mg 2 mg 4 mg 2 mg    Description        Continue current dose of warfarin 4mg  tablets - take 1/2 tablet daily except take 1 tablet mondays, wednesdays and fridays.       INR was 2.9

## 2015-11-30 ENCOUNTER — Encounter (HOSPITAL_COMMUNITY): Payer: Medicare Other

## 2015-11-30 ENCOUNTER — Ambulatory Visit: Payer: Medicare Other | Admitting: Family

## 2015-12-23 ENCOUNTER — Other Ambulatory Visit: Payer: Self-pay | Admitting: Nurse Practitioner

## 2015-12-27 ENCOUNTER — Ambulatory Visit (INDEPENDENT_AMBULATORY_CARE_PROVIDER_SITE_OTHER): Payer: Medicare Other | Admitting: Pharmacist Clinician (PhC)/ Clinical Pharmacy Specialist

## 2015-12-27 DIAGNOSIS — I718 Aortic aneurysm of unspecified site, ruptured: Secondary | ICD-10-CM

## 2015-12-27 DIAGNOSIS — I714 Abdominal aortic aneurysm, without rupture, unspecified: Secondary | ICD-10-CM

## 2015-12-27 LAB — POCT INR: INR: 2

## 2015-12-27 NOTE — Patient Instructions (Signed)
Anticoagulation Dose Instructions as of 12/27/2015      [name] Tue Wed Thu Fri Sat   New Dose 2 mg 4 mg 2 mg 4 mg 2 mg 4 mg 2 mg    Description        Continue current dose of warfarin  tablets - take 1/2 tablet daily except take 1 tablet mondays, wednesdays and fridays.

## 2016-01-04 ENCOUNTER — Other Ambulatory Visit: Payer: Self-pay | Admitting: *Deleted

## 2016-01-04 MED ORDER — TRIAMTERENE-HCTZ 37.5-25 MG PO TABS: ORAL_TABLET | ORAL | Status: DC

## 2016-02-07 ENCOUNTER — Ambulatory Visit (INDEPENDENT_AMBULATORY_CARE_PROVIDER_SITE_OTHER): Payer: Medicare Other | Admitting: Pharmacist Clinician (PhC)/ Clinical Pharmacy Specialist

## 2016-02-07 DIAGNOSIS — I718 Aortic aneurysm of unspecified site, ruptured: Secondary | ICD-10-CM

## 2016-02-07 DIAGNOSIS — I714 Abdominal aortic aneurysm, without rupture, unspecified: Secondary | ICD-10-CM

## 2016-02-07 LAB — POCT INR: INR: 2.5

## 2016-03-13 ENCOUNTER — Encounter: Payer: Self-pay | Admitting: Cardiology

## 2016-03-13 DIAGNOSIS — I252 Old myocardial infarction: Secondary | ICD-10-CM | POA: Diagnosis not present

## 2016-03-13 DIAGNOSIS — I739 Peripheral vascular disease, unspecified: Secondary | ICD-10-CM | POA: Diagnosis not present

## 2016-03-13 DIAGNOSIS — J449 Chronic obstructive pulmonary disease, unspecified: Secondary | ICD-10-CM | POA: Diagnosis not present

## 2016-03-13 DIAGNOSIS — E785 Hyperlipidemia, unspecified: Secondary | ICD-10-CM | POA: Diagnosis not present

## 2016-03-13 DIAGNOSIS — I350 Nonrheumatic aortic (valve) stenosis: Secondary | ICD-10-CM | POA: Diagnosis not present

## 2016-03-13 DIAGNOSIS — I2510 Atherosclerotic heart disease of native coronary artery without angina pectoris: Secondary | ICD-10-CM | POA: Diagnosis not present

## 2016-03-13 DIAGNOSIS — I349 Nonrheumatic mitral valve disorder, unspecified: Secondary | ICD-10-CM | POA: Diagnosis not present

## 2016-03-13 DIAGNOSIS — I119 Hypertensive heart disease without heart failure: Secondary | ICD-10-CM | POA: Diagnosis not present

## 2016-03-13 NOTE — Progress Notes (Signed)
Patient ID: [name], female   DOB: 02-10-1932, 82 y.o.   MRN: [id number]  Maria, [name]  Date of visit:  03/13/2016 DOB:  11-Dec-1931    Age:  83 yrs. Medical record number:  [id number]     Account number:  [id number] Primary Care Provider: Rudi Heap ____________________________ CURRENT DIAGNOSES  1. CAD Native without angina  2. Hypertensive heart disease without heart failure  3. Nonrheumatic mitral valve disorder, unspecified  4. Aortic valve stenosis  5. Hyperlipidemia  6. Old myocardial infarction  7. COPD  8. Peripheral vascular disease, unspecified  9. Cerebral infarction, unspecified  10. Hereditary and idiopathic neuropathy, unspecified ____________________________ ALLERGIES  No Known Drug Allergies ____________________________ MEDICATIONS  1. metoprolol tartrate 50 mg Tablet, BID  2. warfarin 4 mg Tablet, Take as directed  3. Fish Oil 1,000 mg capsule, 1 p.o. daily  4. atorvastatin 40 mg tablet, 1 p.o. daily  5. multivitamin tablet, 1 p.o. daily  6. amlodipine 5 mg tablet, 1 p.o. daily  7. triamterene-hydrochlorothiazid 37.5-25 mg tablet, 1 p.o. daily  8. nitroglycerin 0.4 mg tablet, sublingual, PRN  9. pantoprazole 40 mg tablet,delayed release, BID ____________________________ CHIEF COMPLAINTS  Followup of Hypertensive heart disease without heart failure ____________________________ HISTORY OF PRESENT ILLNESS  Patient seen for cardiac followup. She has been doing well since she was previously here. She denies angina and has no PND, orthopnea, syncope, palpitations, or claudication. Her lipids were reviewed today and are under good control. She has had no bleeding complications from anticoagulation. She is now staying with a niece at night since her sister that she stay with previously has died on her last visit. ____________________________ PAST HISTORY  Past Medical Illnesses:  hypertension, hyperlipidemia, peripheral vascular disease, osteoporosis, esophageal  stricture with dilatations, history of GERD, peripheral neuropathy, E. Coli Sepsis 12/05, CVA with visual Ioss on right 5/08, gallstone pancreatitis;  Cardiovascular Illnesses:  CAD, peripheral vascular disease, anterior MI with rupture 1999, Carotid artery disease, aortic stenosis;  Surgical Procedures:  ventricular aneursymectomy 04/15/98 Dr. Cornelius Moras, aortofemoral bypass July 1999, left CEA and left subclavian bypass 7/03 Dr. Madilyn Fireman, cholecystectomy, hysterectomy, laparotomy for perforated ulcer, lumbar laminectomy, appendectomy, redo L CEA 2009;  NYHA Classification:  I;  Canadian Angina Classification:  Class 0: Asymptomatic;  Cardiology Procedures-Invasive:  cardiac cath (left) May 1999, pericardiocentesis May 1999, cardiac cath (left) September 2004;  Cardiology Procedures-Noninvasive:  adenosine cardiolite December 2009, echocardiogram June 2010, echocardiogram March 2012, echocardiogram April 2016;  Cardiac Cath Results:  normal Left main, 30% stenosis proximal LAD, 40% stenosis mid CFX, calcified RCA, 40% stenosis RCA;  Peripheral Vascular Procedures:  MRA renal arteries July 2004, carotid doppler May 2008;  LVEF of 60% documented via echocardiogram on 03/28/2015,   ____________________________ CARDIO-PULMONARY TEST DATES EKG Date:  08/19/2012;   Cardiac Cath Date:  08/26/2003;  CABG: 04/15/1998;  Nuclear Study Date:  11/18/2008;  Echocardiography Date: 03/28/2015;  Chest Xray Date: 05/16/2009;   ____________________________ FAMILY HISTORY Brother -- Brother dead Brother -- Brother alive and well Brother -- Brother alive and well Brother -- Alcoholism, Brother alive with problem Brother -- Coronary Artery Disease, Brother dead Father -- Congestive heart failure, Father dead Mother -- Congestive heart failure, Mother dead Sister -- Sister alive and well Sister -- Coronary Artery Disease Sister -- Sister alive and well Sister -- Sister alive and well ____________________________ SOCIAL  HISTORY Alcohol Use:  no alcohol use;  Smoking:  used to smoke but quit 1999;  Diet:  regular diet;  Lifestyle:  married, 2  sons, one died accidently  and ne died suddenly;  Exercise:  some exercise;  Occupation:  retired and [employer];  Residence:  lives with husband;   ____________________________ REVIEW OF SYSTEMS General:  denies recent weight change, fatique or change in exercise tolerance. Eyes: wears eye glasses/contact lenses, Stroke right eye Respiratory: mild dyspnea with exertion Cardiovascular:  please review HPI Abdominal: denies dyspepsia, GI bleeding, constipation, or diarrheaGenitourinary-Female: frequency, stress incontinence Musculoskeletal:  osteoporosis, compression fractures, chronic low back pain Psychiatric:  anxiety  ____________________________ PHYSICAL EXAMINATION VITAL SIGNS  Blood Pressure:  148/68 Sitting, Left arm, regular cuff  , 140/60 Standing, Left arm and regular cuff   Pulse:  72/min. Weight:  146.00 lbs. Height:  65"BMI: 24  Constitutional:  pleasant white female, in no acute distress Skin:  warm and dry to touch, no apparent skin lesions, or masses noted. Head:  normocephalic, normal hair pattern, no masses or tenderness ENT:  ears, nose and throat reveal no gross abnormalities.  Dentition good. Neck:  bilateral carotid bruits, healed left carotid endarterectomy scar, no JVD, no masses, non-tender Chest:  clear to auscultation, healed median sternotomy scar, healed scar in left clavicular area Cardiac:  regular rhythm, normal S1 and S2, No S3 or S4, harsh grade 2/6 systolic murmur at aortic area and left sternal border Peripheral Pulses:  posterior tibial pulses 2+, dorsalis pedis pulses diminished, femoral pulses 2+, bilateral femoral bruits present Extremities & Back:  no deformities, clubbing, cyanosis, erythema or edema observed. Normal muscle strength and tone. Neurological:  no gross motor or sensory deficits noted, affect appropriate, oriented  x3. ____________________________ MOST RECENT LIPID PANEL 01/10/15  CHOL TOTL 157 mg/dl, LDL 72 NM, HDL 45 mg/dl, TRIGLYCER 161202 mg/dl and CHOL/HDL 3.9 (Calc) ____________________________ IMPRESSIONS/PLAN 1. Coronary artery disease with no angina 2. Hypertensive heart disease currently well controlled 3. Peripheral vascular disease 4. Long-term use of anticoagulation without complications  Recommendations:  No bleeding complications from anticoagulation. She is able to be relatively active without much shortness of breath and without much in the way of cardiac symptoms. Recommended followup in 6 months. Recent lab work reviewed from primary doctor. ____________________________ Christianne DolinDAYS ORDERS  1. Return Visit: 6 months  2. 12 Lead EKG: 6 months                       ____________________________ Cardiology Physician:  Darden PalmerW. Spencer Tilley, Jr. MD [facility] IndianapolisFACC

## 2016-03-16 DIAGNOSIS — R1314 Dysphagia, pharyngoesophageal phase: Secondary | ICD-10-CM | POA: Diagnosis not present

## 2016-03-27 ENCOUNTER — Ambulatory Visit (INDEPENDENT_AMBULATORY_CARE_PROVIDER_SITE_OTHER): Payer: Medicare Other | Admitting: Pharmacist Clinician (PhC)/ Clinical Pharmacy Specialist

## 2016-03-27 DIAGNOSIS — I714 Abdominal aortic aneurysm, without rupture, unspecified: Secondary | ICD-10-CM

## 2016-03-27 DIAGNOSIS — I119 Hypertensive heart disease without heart failure: Secondary | ICD-10-CM

## 2016-03-27 DIAGNOSIS — I482 Chronic atrial fibrillation, unspecified: Secondary | ICD-10-CM

## 2016-03-27 DIAGNOSIS — E785 Hyperlipidemia, unspecified: Secondary | ICD-10-CM | POA: Diagnosis not present

## 2016-03-27 LAB — COAGUCHEK XS/INR WAIVED
INR: 1.6 — ABNORMAL HIGH (ref 0.9–1.1)
Prothrombin Time: 19.5 s

## 2016-03-27 MED ORDER — TRIAMTERENE-HCTZ 37.5-25 MG PO TABS: ORAL_TABLET | ORAL | Status: DC

## 2016-03-27 MED ORDER — AMLODIPINE BESYLATE 5 MG PO TABS: 5.0000 mg | ORAL_TABLET | Freq: Every day | ORAL | Status: DC

## 2016-03-27 MED ORDER — ATORVASTATIN CALCIUM 40 MG PO TABS: 40.0000 mg | ORAL_TABLET | Freq: Every day | ORAL | Status: DC

## 2016-03-27 NOTE — Patient Instructions (Signed)
Anticoagulation Dose Instructions as of 03/27/2016      Maria SmilesSun Mon Tue Wed Thu Fri Sat   New Dose 2 mg 4 mg 2 mg 4 mg 2 mg 4 mg 2 mg    Description        Take another 0.5 tablet when you get home today. Take 1.5 tablets tomorrow (Wednesday). Then continue regular schedule.  INR today was 1.6 (Goal is 2-3)

## 2016-04-10 ENCOUNTER — Encounter: Payer: Self-pay | Admitting: Pharmacist Clinician (PhC)/ Clinical Pharmacy Specialist

## 2016-04-17 ENCOUNTER — Ambulatory Visit (INDEPENDENT_AMBULATORY_CARE_PROVIDER_SITE_OTHER): Payer: Medicare Other | Admitting: Pharmacist Clinician (PhC)/ Clinical Pharmacy Specialist

## 2016-04-17 DIAGNOSIS — I714 Abdominal aortic aneurysm, without rupture, unspecified: Secondary | ICD-10-CM

## 2016-04-17 LAB — COAGUCHEK XS/INR WAIVED
INR: 3.1 — ABNORMAL HIGH (ref 0.9–1.1)
Prothrombin Time: 36.7 s

## 2016-04-17 NOTE — Patient Instructions (Signed)
Anticoagulation Dose Instructions as of 04/17/2016      Glynis SmilesSun Mon Tue Wed Thu Fri Sat   New Dose 2 mg 4 mg 2 mg 4 mg 2 mg 4 mg 2 mg    Description        Continue above schedule.  Have a high vitamin K food today and tomorrow.  INR today was 3.1 (Goal is 2-3)

## 2016-05-11 ENCOUNTER — Other Ambulatory Visit: Payer: Self-pay | Admitting: Nurse Practitioner

## 2016-06-01 ENCOUNTER — Ambulatory Visit (INDEPENDENT_AMBULATORY_CARE_PROVIDER_SITE_OTHER): Payer: Medicare Other | Admitting: Pharmacist Clinician (PhC)/ Clinical Pharmacy Specialist

## 2016-06-01 DIAGNOSIS — I714 Abdominal aortic aneurysm, without rupture, unspecified: Secondary | ICD-10-CM

## 2016-06-01 LAB — COAGUCHEK XS/INR WAIVED
INR: 2 — AB (ref 0.9–1.1)
PROTHROMBIN TIME: 24.3 s

## 2016-06-01 NOTE — Patient Instructions (Signed)
Anticoagulation Dose Instructions as of 06/01/2016      Glynis SmilesSun Mon Tue Wed Thu Fri Sat   New Dose 2 mg 4 mg 2 mg 4 mg 2 mg 4 mg 2 mg    Description        Continue above schedule.  Have a high vitamin K food today and tomorrow.  INR 2.0 today

## 2016-07-06 ENCOUNTER — Encounter: Payer: Self-pay | Admitting: *Deleted

## 2016-07-13 ENCOUNTER — Encounter: Payer: Self-pay | Admitting: Pharmacist Clinician (PhC)/ Clinical Pharmacy Specialist

## 2016-07-20 ENCOUNTER — Ambulatory Visit (INDEPENDENT_AMBULATORY_CARE_PROVIDER_SITE_OTHER): Payer: Medicare Other | Admitting: Pharmacist Clinician (PhC)/ Clinical Pharmacy Specialist

## 2016-07-20 DIAGNOSIS — I714 Abdominal aortic aneurysm, without rupture, unspecified: Secondary | ICD-10-CM

## 2016-07-20 LAB — COAGUCHEK XS/INR WAIVED
INR: 2.8 — AB (ref 0.9–1.1)
PROTHROMBIN TIME: 34.1 s

## 2016-07-20 NOTE — Patient Instructions (Signed)
Anticoagulation Dose Instructions as of 07/20/2016      Maria SmilesSun Mon Tue Wed Thu Fri Sat   New Dose 2 mg 4 mg 2 mg 4 mg 2 mg 4 mg 2 mg    Description   Continue above schedule.  Have a high vitamin K food today and tomorrow.  INR 2.8 today

## 2016-07-28 ENCOUNTER — Other Ambulatory Visit: Payer: Self-pay | Admitting: Nurse Practitioner

## 2016-08-31 ENCOUNTER — Ambulatory Visit (INDEPENDENT_AMBULATORY_CARE_PROVIDER_SITE_OTHER): Payer: Medicare Other | Admitting: Pharmacist Clinician (PhC)/ Clinical Pharmacy Specialist

## 2016-08-31 DIAGNOSIS — I714 Abdominal aortic aneurysm, without rupture, unspecified: Secondary | ICD-10-CM

## 2016-08-31 DIAGNOSIS — Z23 Encounter for immunization: Secondary | ICD-10-CM | POA: Diagnosis not present

## 2016-08-31 LAB — COAGUCHEK XS/INR WAIVED
INR: 2.2 — ABNORMAL HIGH (ref 0.9–1.1)
PROTHROMBIN TIME: 26.9 s

## 2016-08-31 NOTE — Patient Instructions (Signed)
Anticoagulation Dose Instructions as of 08/31/2016      Glynis SmilesSun Mon Tue Wed Thu Fri Sat   New Dose 2 mg 4 mg 2 mg 4 mg 2 mg 4 mg 2 mg    Description   Continue above schedule.  Have a high vitamin K food today and tomorrow.  INR 2.2 today

## 2016-09-14 DIAGNOSIS — I349 Nonrheumatic mitral valve disorder, unspecified: Secondary | ICD-10-CM | POA: Diagnosis not present

## 2016-09-14 DIAGNOSIS — I739 Peripheral vascular disease, unspecified: Secondary | ICD-10-CM | POA: Diagnosis not present

## 2016-09-14 DIAGNOSIS — I252 Old myocardial infarction: Secondary | ICD-10-CM | POA: Diagnosis not present

## 2016-09-14 DIAGNOSIS — I350 Nonrheumatic aortic (valve) stenosis: Secondary | ICD-10-CM | POA: Diagnosis not present

## 2016-09-14 DIAGNOSIS — I119 Hypertensive heart disease without heart failure: Secondary | ICD-10-CM | POA: Diagnosis not present

## 2016-09-14 DIAGNOSIS — I2510 Atherosclerotic heart disease of native coronary artery without angina pectoris: Secondary | ICD-10-CM | POA: Diagnosis not present

## 2016-09-14 DIAGNOSIS — J449 Chronic obstructive pulmonary disease, unspecified: Secondary | ICD-10-CM | POA: Diagnosis not present

## 2016-09-14 DIAGNOSIS — E785 Hyperlipidemia, unspecified: Secondary | ICD-10-CM | POA: Diagnosis not present

## 2016-09-19 ENCOUNTER — Other Ambulatory Visit: Payer: Self-pay | Admitting: Nurse Practitioner

## 2016-10-12 ENCOUNTER — Ambulatory Visit (INDEPENDENT_AMBULATORY_CARE_PROVIDER_SITE_OTHER): Payer: Medicare Other | Admitting: Pharmacist Clinician (PhC)/ Clinical Pharmacy Specialist

## 2016-10-12 DIAGNOSIS — I714 Abdominal aortic aneurysm, without rupture, unspecified: Secondary | ICD-10-CM

## 2016-10-12 LAB — COAGUCHEK XS/INR WAIVED
INR: 2.4 — AB (ref 0.9–1.1)
Prothrombin Time: 28.5 s

## 2016-10-12 NOTE — Patient Instructions (Signed)
Anticoagulation Dose Instructions as of 10/12/2016      Maria SmilesSun Mon Tue Wed Thu Fri Sat   New Dose 2 mg 4 mg 2 mg 4 mg 2 mg 4 mg 2 mg    Description   Continue above schedule  INR today 2.4

## 2016-10-29 ENCOUNTER — Other Ambulatory Visit: Payer: Self-pay | Admitting: Pharmacist Clinician (PhC)/ Clinical Pharmacy Specialist

## 2016-10-29 ENCOUNTER — Other Ambulatory Visit: Payer: Self-pay | Admitting: Nurse Practitioner

## 2016-10-29 DIAGNOSIS — E785 Hyperlipidemia, unspecified: Secondary | ICD-10-CM

## 2016-10-29 DIAGNOSIS — I119 Hypertensive heart disease without heart failure: Secondary | ICD-10-CM

## 2016-10-30 NOTE — Telephone Encounter (Signed)
Last refill without being seen 

## 2016-11-23 ENCOUNTER — Ambulatory Visit (INDEPENDENT_AMBULATORY_CARE_PROVIDER_SITE_OTHER): Payer: Medicare Other | Admitting: Pharmacist Clinician (PhC)/ Clinical Pharmacy Specialist

## 2016-11-23 DIAGNOSIS — I714 Abdominal aortic aneurysm, without rupture, unspecified: Secondary | ICD-10-CM

## 2016-11-23 LAB — COAGUCHEK XS/INR WAIVED
INR: 2.2 — AB (ref 0.9–1.1)
Prothrombin Time: 25.9 s

## 2016-11-23 NOTE — Patient Instructions (Signed)
Anticoagulation Dose Instructions as of 11/23/2016      Maria SmilesSun Mon Tue Wed Thu Fri Sat   New Dose 2 mg 4 mg 2 mg 4 mg 2 mg 4 mg 2 mg    Description   Continue above schedule  INR today 2.2

## 2016-12-06 ENCOUNTER — Other Ambulatory Visit: Payer: Self-pay | Admitting: Nurse Practitioner

## 2016-12-06 DIAGNOSIS — I119 Hypertensive heart disease without heart failure: Secondary | ICD-10-CM

## 2016-12-06 DIAGNOSIS — E785 Hyperlipidemia, unspecified: Secondary | ICD-10-CM

## 2017-01-04 ENCOUNTER — Ambulatory Visit (INDEPENDENT_AMBULATORY_CARE_PROVIDER_SITE_OTHER): Payer: Medicare Other | Admitting: Pharmacist

## 2017-01-04 DIAGNOSIS — I714 Abdominal aortic aneurysm, without rupture, unspecified: Secondary | ICD-10-CM

## 2017-01-04 LAB — COAGUCHEK XS/INR WAIVED
INR: 2.7 — ABNORMAL HIGH (ref 0.9–1.1)
PROTHROMBIN TIME: 32.2 s

## 2017-01-04 NOTE — Patient Instructions (Addendum)
Anticoagulation Dose Instructions as of 01/04/2017      Maria SmilesSun Mon Tue Wed Thu Fri Sat   New Dose 2 mg 4 mg 2 mg 4 mg 2 mg 4 mg 2 mg    Description   Continue above schedule  INR today 2.7 (goal: 2.0-3.0)

## 2017-01-07 ENCOUNTER — Other Ambulatory Visit: Payer: Self-pay | Admitting: Nurse Practitioner

## 2017-01-08 NOTE — Telephone Encounter (Signed)
Pt aware. Appt made for 02/08/17

## 2017-01-08 NOTE — Telephone Encounter (Signed)
Last refill without being seen 

## 2017-02-08 ENCOUNTER — Encounter: Payer: Self-pay | Admitting: Nurse Practitioner

## 2017-02-08 ENCOUNTER — Ambulatory Visit (INDEPENDENT_AMBULATORY_CARE_PROVIDER_SITE_OTHER): Payer: Medicare Other | Admitting: Nurse Practitioner

## 2017-02-08 VITALS — BP 139/70 | HR 69 | Temp 97.5°F | Ht 64.0 in | Wt 150.0 lb

## 2017-02-08 DIAGNOSIS — E7800 Pure hypercholesterolemia, unspecified: Secondary | ICD-10-CM

## 2017-02-08 DIAGNOSIS — M5136 Other intervertebral disc degeneration, lumbar region: Secondary | ICD-10-CM | POA: Diagnosis not present

## 2017-02-08 DIAGNOSIS — K222 Esophageal obstruction: Secondary | ICD-10-CM

## 2017-02-08 DIAGNOSIS — E782 Mixed hyperlipidemia: Secondary | ICD-10-CM | POA: Diagnosis not present

## 2017-02-08 DIAGNOSIS — E8881 Metabolic syndrome: Secondary | ICD-10-CM

## 2017-02-08 DIAGNOSIS — K219 Gastro-esophageal reflux disease without esophagitis: Secondary | ICD-10-CM | POA: Diagnosis not present

## 2017-02-08 DIAGNOSIS — G629 Polyneuropathy, unspecified: Secondary | ICD-10-CM | POA: Diagnosis not present

## 2017-02-08 DIAGNOSIS — I714 Abdominal aortic aneurysm, without rupture, unspecified: Secondary | ICD-10-CM

## 2017-02-08 DIAGNOSIS — Z7901 Long term (current) use of anticoagulants: Secondary | ICD-10-CM | POA: Diagnosis not present

## 2017-02-08 DIAGNOSIS — I119 Hypertensive heart disease without heart failure: Secondary | ICD-10-CM

## 2017-02-08 DIAGNOSIS — I2510 Atherosclerotic heart disease of native coronary artery without angina pectoris: Secondary | ICD-10-CM | POA: Diagnosis not present

## 2017-02-08 DIAGNOSIS — M545 Low back pain: Secondary | ICD-10-CM

## 2017-02-08 DIAGNOSIS — G8929 Other chronic pain: Secondary | ICD-10-CM

## 2017-02-08 DIAGNOSIS — M51369 Other intervertebral disc degeneration, lumbar region without mention of lumbar back pain or lower extremity pain: Secondary | ICD-10-CM

## 2017-02-08 LAB — COAGUCHEK XS/INR WAIVED
INR: 2.9 — ABNORMAL HIGH (ref 0.9–1.1)
PROTHROMBIN TIME: 34.9 s

## 2017-02-08 MED ORDER — HYDROCODONE-ACETAMINOPHEN 5-325 MG PO TABS: 1.0000 | ORAL_TABLET | Freq: Four times a day (QID) | ORAL | 0 refills | Status: DC | PRN

## 2017-02-08 MED ORDER — METOPROLOL TARTRATE 50 MG PO TABS: 50.0000 mg | ORAL_TABLET | Freq: Two times a day (BID) | ORAL | 5 refills | Status: DC

## 2017-02-08 MED ORDER — AMLODIPINE BESYLATE 5 MG PO TABS: 5.0000 mg | ORAL_TABLET | Freq: Every day | ORAL | 5 refills | Status: DC

## 2017-02-08 MED ORDER — PANTOPRAZOLE SODIUM 40 MG PO TBEC: 40.0000 mg | DELAYED_RELEASE_TABLET | Freq: Every day | ORAL | 5 refills | Status: AC

## 2017-02-08 MED ORDER — TRIAMTERENE-HCTZ 37.5-25 MG PO TABS: 1.0000 | ORAL_TABLET | Freq: Every day | ORAL | 5 refills | Status: DC

## 2017-02-08 MED ORDER — ATORVASTATIN CALCIUM 40 MG PO TABS: 40.0000 mg | ORAL_TABLET | Freq: Every day | ORAL | 5 refills | Status: AC

## 2017-02-08 MED ORDER — WARFARIN SODIUM 4 MG PO TABS: 4.0000 mg | ORAL_TABLET | Freq: Every day | ORAL | 5 refills | Status: DC

## 2017-02-08 NOTE — Patient Instructions (Signed)
Anticoagulation Dose Instructions as of 02/08/2017      Maria SmilesSun Mon Tue Wed Thu Fri Sat   New Dose 2 mg 4 mg 2 mg 4 mg 2 mg 4 mg 2 mg    Description   Continue above schedule  INR today 2.9 (goal: 2.0-3.0)    Recheck in 4 weeks

## 2017-02-08 NOTE — Progress Notes (Signed)
Subjective:    Patient ID: [name], female    DOB: 07-07-1932, 83 y.o.   MRN: [id number] Patient here today for follow up of chronic medical problems.  Outpatient Encounter Prescriptions as of 02/08/2017  Medication Sig  . amLODipine (NORVASC) 5 MG tablet TAKE 1 TABLET DAILY  . atorvastatin (LIPITOR) 40 MG tablet TAKE 1 TABLET DAILY  . HYDROcodone-acetaminophen (NORCO/VICODIN) 5-325 MG tablet Take 1 tablet by mouth every 6 (six) hours as needed. For pain  . metoprolol (LOPRESSOR) 50 MG tablet TAKE (1) TABLET TWICE A DAY.  . pantoprazole (PROTONIX) 40 MG tablet Take 1 tablet (40 mg total) by mouth daily.  [name] triamterene-hydrochlorothiazide (MAXZIDE-25) 37.5-25 MG tablet TAKE ONE TABLET BY MOUTH ONE TIME DAILY  . warfarin (COUMADIN) 4 MG tablet TAKE 1 TABLET DAILY AS DIRECTED   No facility-administered encounter medications on file as of 02/08/2017.     Hypertension  This is a chronic problem. The current episode started more than 1 year ago. The problem is unchanged. The problem is controlled. Risk factors for coronary artery disease include dyslipidemia, post-menopausal state and sedentary lifestyle. Past treatments include calcium channel blockers and diuretics. The current treatment provides moderate improvement. Hypertensive end-organ damage includes CAD/MI.  Hyperlipidemia  This is a chronic problem. The current episode started more than 1 year ago. Recent lipid tests were reviewed and are variable. She has no history of diabetes, hypothyroidism or obesity. Current antihyperlipidemic treatment includes statins. The current treatment provides moderate improvement of lipids. Compliance problems include adherence to diet and adherence to exercise.  Risk factors for coronary artery disease include dyslipidemia, hypertension, post-menopausal and a sedentary lifestyle.  CAD/CVA/AAA Patient doing well. No reisdual effects from stroke- sees cariologist every 6 months. Metroplol working weel.  Had echo cardiogram in April which was unchanged from previous Metabolic syndrome Patient is suppose to be watching diet and checking blood sugars but she does not do either. DDD/chronic back pain Use norco 5/325- has been taking for years- tries not to take everyday- discussed need for pain management work up if she wants to continue to get pain meds.      Review of Systems  Constitutional: Negative.   HENT: Negative.   Respiratory: Negative.   Cardiovascular: Negative.   Gastrointestinal: Negative.   Genitourinary: Negative.   Neurological: Negative.   Psychiatric/Behavioral: Negative.   All other systems reviewed and are negative.      Objective:   Physical Exam  Constitutional: She is oriented to person, place, and time. She appears well-developed and well-nourished.  HENT:  Nose: Nose normal.  Mouth/Throat: Oropharynx is clear and moist.  Eyes: EOM are normal.  Neck: Trachea normal, normal range of motion and full passive range of motion without pain. Neck supple. No JVD present. Carotid bruit is not present. No thyromegaly present.  Cardiovascular: Normal rate, regular rhythm, normal heart sounds and intact distal pulses.  Exam reveals no gallop and no friction rub.   No murmur heard. Right carotid bruit 2/6   Pulmonary/Chest: Effort normal and breath sounds normal.  Abdominal: Soft. Bowel sounds are normal. She exhibits no distension and no mass. There is no tenderness.  Musculoskeletal: Normal range of motion.  Lymphadenopathy:    She has no cervical adenopathy.  Neurological: She is alert and oriented to person, place, and time. She has normal reflexes.  Skin: Skin is warm and dry.  Psychiatric: She has a normal mood and affect. Her behavior is normal. Judgment and thought content normal.  BP 139/70   Pulse 69   Temp 97.5 F (36.4 C) (Oral)   Ht '5\' 4"'$  (1.626 m)   Wt 150 lb (68 kg)   BMI 25.75 kg/m      Assessment & Plan:   1. Coronary artery disease  involving native coronary artery of native heart without angina pectoris Keep follow up with cardiology - metoprolol (LOPRESSOR) 50 MG tablet; Take 1 tablet (50 mg total) by mouth 2 (two) times daily.  Dispense: 60 tablet; Refill: 5  2. AAA (abdominal aortic aneurysm) without rupture (HCC) - CoaguChek XS/INR Waived  3. Hypertensive heart disease without heart failure Low fat diet - amLODipine (NORVASC) 5 MG tablet; Take 1 tablet (5 mg total) by mouth daily.  Dispense: 30 tablet; Refill: 5 - triamterene-hydrochlorothiazide (MAXZIDE-25) 37.5-25 MG tablet; Take 1 tablet by mouth daily.  Dispense: 30 tablet; Refill: 5 - CMP14+EGFR  4. Peripheral polyneuropathy ([place]) Do not go barefooted  5. Chronic anticoagulation - warfarin (COUMADIN) 4 MG tablet; Take 1 tablet (4 mg total) by mouth daily. as directed  Dispense: 30 tablet; Refill: 5  6. Pure hypercholesterolemia Low fat diet - Lipid panel  7. Metabolic syndrome Watch carbs in diet  8. DDD (degenerative disc disease), lumbar  9. Chronic midline low back pain without sciatica - HYDROcodone-acetaminophen (NORCO/VICODIN) 5-325 MG tablet; Take 1 tablet by mouth every 6 (six) hours as needed. For pain  Dispense: 60 tablet; Refill: 0  10. Mixed hyperlipidemia - atorvastatin (LIPITOR) 40 MG tablet; Take 1 tablet (40 mg total) by mouth daily.  Dispense: 30 tablet; Refill: 5  11. GERD with stricture Avoid spicy foods Do not eat 2 hours prior to bedtime - pantoprazole (PROTONIX) 40 MG tablet; Take 1 tablet (40 mg total) by mouth daily.  Dispense: 60 tablet; Refill: 5    Labs pending Health maintenance reviewed Diet and exercise encouraged Continue all meds Follow up  In 3 months   [place], FNP

## 2017-02-09 LAB — CMP14+EGFR
ALBUMIN: 3.9 g/dL (ref 3.5–4.7)
ALT: 12 IU/L (ref 0–32)
AST: 18 IU/L (ref 0–40)
Albumin/Globulin Ratio: 1.4 (ref 1.2–2.2)
Alkaline Phosphatase: 91 IU/L (ref 39–117)
BILIRUBIN TOTAL: 0.7 mg/dL (ref 0.0–1.2)
BUN / CREAT RATIO: 18 (ref 12–28)
BUN: 29 mg/dL — AB (ref 8–27)
CHLORIDE: 97 mmol/L (ref 96–106)
CO2: 23 mmol/L (ref 18–29)
Calcium: 9.2 mg/dL (ref 8.7–10.3)
Creatinine, Ser: 1.61 mg/dL — ABNORMAL HIGH (ref 0.57–1.00)
GFR calc non Af Amer: 30 mL/min/{1.73_m2} — ABNORMAL LOW (ref >59)
GFR, EST AFRICAN AMERICAN: 34 mL/min/{1.73_m2} — AB (ref >59)
GLUCOSE: 104 mg/dL — AB (ref 65–99)
Globulin, Total: 2.7 g/dL (ref 1.5–4.5)
Potassium: 4.1 mmol/L (ref 3.5–5.2)
Sodium: 137 mmol/L (ref 134–144)
TOTAL PROTEIN: 6.6 g/dL (ref 6.0–8.5)

## 2017-02-09 LAB — LIPID PANEL
CHOL/HDL RATIO: 3.6 ratio (ref 0.0–4.4)
CHOLESTEROL TOTAL: 153 mg/dL (ref 100–199)
HDL: 42 mg/dL (ref >39)
LDL CALC: 78 mg/dL (ref 0–99)
TRIGLYCERIDES: 163 mg/dL — AB (ref 0–149)
VLDL CHOLESTEROL CAL: 33 mg/dL (ref 5–40)

## 2017-02-15 ENCOUNTER — Ambulatory Visit (INDEPENDENT_AMBULATORY_CARE_PROVIDER_SITE_OTHER): Payer: Medicare Other | Admitting: Pharmacist

## 2017-02-15 DIAGNOSIS — I714 Abdominal aortic aneurysm, without rupture, unspecified: Secondary | ICD-10-CM

## 2017-02-15 DIAGNOSIS — I2510 Atherosclerotic heart disease of native coronary artery without angina pectoris: Secondary | ICD-10-CM

## 2017-02-15 LAB — COAGUCHEK XS/INR WAIVED
INR: 4.4 — ABNORMAL HIGH (ref 0.9–1.1)
INR: 4.3 — ABNORMAL HIGH (ref 0.9–1.1)
Prothrombin Time: 52.4 s
Prothrombin Time: 51.5 s

## 2017-02-15 NOTE — Patient Instructions (Signed)
Anticoagulation Dose Instructions as of 02/15/2017      [name]   New Dose 2 mg 4 mg 2 mg 4 mg 2 mg 4 mg 2 mg    Description   Try to eat some Vitamin K today. Do not take dose tomorrow. Be careful not to cut yourself or fall since blood is thin.  INR today 4.3 (goal: 2.0-3.0)

## 2017-02-22 ENCOUNTER — Ambulatory Visit (INDEPENDENT_AMBULATORY_CARE_PROVIDER_SITE_OTHER): Payer: Medicare Other | Admitting: Pharmacist

## 2017-02-22 DIAGNOSIS — I714 Abdominal aortic aneurysm, without rupture, unspecified: Secondary | ICD-10-CM

## 2017-02-22 DIAGNOSIS — I2510 Atherosclerotic heart disease of native coronary artery without angina pectoris: Secondary | ICD-10-CM

## 2017-02-22 LAB — COAGUCHEK XS/INR WAIVED
INR: 2.4 — ABNORMAL HIGH (ref 0.9–1.1)
Prothrombin Time: 28.8 s

## 2017-02-22 NOTE — Patient Instructions (Signed)
Anticoagulation Dose Instructions as of 02/22/2017      Glynis SmilesSun Mon Tue Wed Thu Fri Sat   New Dose 2 mg 4 mg 2 mg 4 mg 2 mg 4 mg 2 mg    Description   Continue to take warfarin the same way you have been. 1 tablet on Mon, Wed, Friday, and 1/2 tablet on Sun, Tues, Thu, and Saturday.  INR today 2.4 (goal: 2.0-3.0)

## 2017-04-02 DIAGNOSIS — I349 Nonrheumatic mitral valve disorder, unspecified: Secondary | ICD-10-CM | POA: Diagnosis not present

## 2017-04-02 DIAGNOSIS — I350 Nonrheumatic aortic (valve) stenosis: Secondary | ICD-10-CM | POA: Diagnosis not present

## 2017-04-02 DIAGNOSIS — J449 Chronic obstructive pulmonary disease, unspecified: Secondary | ICD-10-CM | POA: Diagnosis not present

## 2017-04-02 DIAGNOSIS — I739 Peripheral vascular disease, unspecified: Secondary | ICD-10-CM | POA: Diagnosis not present

## 2017-04-02 DIAGNOSIS — I119 Hypertensive heart disease without heart failure: Secondary | ICD-10-CM | POA: Diagnosis not present

## 2017-04-02 DIAGNOSIS — I252 Old myocardial infarction: Secondary | ICD-10-CM | POA: Diagnosis not present

## 2017-04-02 DIAGNOSIS — I2510 Atherosclerotic heart disease of native coronary artery without angina pectoris: Secondary | ICD-10-CM | POA: Diagnosis not present

## 2017-04-02 DIAGNOSIS — E785 Hyperlipidemia, unspecified: Secondary | ICD-10-CM | POA: Diagnosis not present

## 2017-04-05 DIAGNOSIS — R011 Cardiac murmur, unspecified: Secondary | ICD-10-CM | POA: Diagnosis not present

## 2017-04-19 ENCOUNTER — Ambulatory Visit (INDEPENDENT_AMBULATORY_CARE_PROVIDER_SITE_OTHER): Payer: Medicare Other | Admitting: Pharmacist

## 2017-04-19 DIAGNOSIS — I714 Abdominal aortic aneurysm, without rupture, unspecified: Secondary | ICD-10-CM

## 2017-04-19 DIAGNOSIS — I2510 Atherosclerotic heart disease of native coronary artery without angina pectoris: Secondary | ICD-10-CM

## 2017-04-19 LAB — COAGUCHEK XS/INR WAIVED
INR: 3.5 — AB (ref 0.9–1.1)
Prothrombin Time: 41.6 s

## 2017-04-19 NOTE — Patient Instructions (Signed)
Anticoagulation Warfarin Dose Instructions as of 04/19/2017      Maria SmilesSun Mon Tue Wed Thu Fri Sat   New Dose 2 mg 4 mg 2 mg 4 mg 2 mg 4 mg 2 mg    Description   Skip dose tomorrow 5/12. Then continue to take warfarin the same way you have been. 1 tablet on Mon, Wed, Friday, and 1/2 tablet on Sun, Tues, Thu, and Saturday.  INR today 3.5 (goal: 2.0-3.0) "blood is a little thin today"

## 2017-05-07 ENCOUNTER — Encounter: Payer: Self-pay | Admitting: Nurse Practitioner

## 2017-05-07 ENCOUNTER — Ambulatory Visit (INDEPENDENT_AMBULATORY_CARE_PROVIDER_SITE_OTHER): Payer: Medicare Other | Admitting: Nurse Practitioner

## 2017-05-07 VITALS — BP 133/65 | HR 77 | Temp 97.4°F | Ht 64.0 in | Wt 150.0 lb

## 2017-05-07 DIAGNOSIS — G8929 Other chronic pain: Secondary | ICD-10-CM | POA: Diagnosis not present

## 2017-05-07 DIAGNOSIS — I2510 Atherosclerotic heart disease of native coronary artery without angina pectoris: Secondary | ICD-10-CM

## 2017-05-07 DIAGNOSIS — M545 Low back pain, unspecified: Secondary | ICD-10-CM

## 2017-05-07 DIAGNOSIS — L209 Atopic dermatitis, unspecified: Secondary | ICD-10-CM

## 2017-05-07 MED ORDER — HYDROXYZINE HCL 10 MG PO TABS: 10.0000 mg | ORAL_TABLET | Freq: Three times a day (TID) | ORAL | 0 refills | Status: DC | PRN

## 2017-05-07 MED ORDER — RANITIDINE HCL 150 MG PO TABS: 150.0000 mg | ORAL_TABLET | Freq: Two times a day (BID) | ORAL | 3 refills | Status: DC

## 2017-05-07 MED ORDER — HYDROCODONE-ACETAMINOPHEN 5-325 MG PO TABS: 1.0000 | ORAL_TABLET | Freq: Four times a day (QID) | ORAL | 0 refills | Status: AC | PRN

## 2017-05-07 NOTE — Addendum Note (Signed)
Addended by: Bennie PieriniMARTIN, MARY-Faren on: 05/07/2017 02:41 PM   Modules accepted: Orders

## 2017-05-07 NOTE — Progress Notes (Addendum)
Subjective:    Patient ID: [name], female    DOB: August 03, 1932, 83 y.o.   MRN: [id number]  HPI Patient comes into the office complaining of a rash.  Patient states she noticed the rash a couple days ago and that it is very itchy.  Rash is on bilateral anterior thighs and back.  Patient has avoided scratching when possible, but states she has to scratch sometimes because the itch is unbearable.  Patient has tried covering the rash with lotion, but this just soothed the itching for a short amount of time.   Review of Systems  Constitutional: Negative for activity change, appetite change and fatigue.  Respiratory: Negative for shortness of breath and wheezing.   Cardiovascular: Negative for chest pain and palpitations.  Gastrointestinal: Negative for abdominal distention and abdominal pain.  Musculoskeletal: Negative for arthralgias and neck pain.  Skin: Positive for rash (bilateral upper thighs and back).  Neurological: Negative for dizziness and headaches.  All other systems reviewed and are negative.      Objective:   Physical Exam  Constitutional: She is oriented to person, place, and time. She appears well-developed and well-nourished. No distress.  HENT:  Head: Normocephalic.  Right Ear: External ear normal.  Left Ear: External ear normal.  Nose: Nose normal.  Mouth/Throat: Oropharynx is clear and moist.  Eyes: Pupils are equal, round, and reactive to light.  Neck: Normal range of motion. Neck supple.  Cardiovascular: Normal rate, regular rhythm, normal heart sounds and intact distal pulses.   No murmur heard. Pulmonary/Chest: Effort normal and breath sounds normal.  Abdominal: Soft. Bowel sounds are normal. She exhibits no distension. There is no tenderness.  Musculoskeletal: Normal range of motion.  Neurological: She is alert and oriented to person, place, and time.  Skin: Skin is warm and dry. Rash: bilateral upper thighs and groin, upper arms, chest, and back.    Psychiatric: She has a normal mood and affect. Her behavior is normal. Judgment and thought content normal.      BP 133/65   Pulse 77   Temp 97.4 F (36.3 C) (Oral)   Ht 5\' 4"  (1.626 m)   Wt 150 lb (68 kg)   BMI 25.75 kg/m      Assessment & Plan:   1. Atopic dermatitis, unspecified type    Patient is allergic to prednisone Meds ordered this encounter  Medications  . ranitidine (ZANTAC) 150 MG tablet    Sig: Take 1 tablet (150 mg total) by mouth 2 (two) times daily.    Dispense:  30 tablet    Refill:  3    Order Specific Question:   Supervising Provider    Answer:   VINCENT, CAROL L [[id number]]  . hydrOXYzine (ATARAX/VISTARIL) 10 MG tablet    Sig: Take 1 tablet (10 mg total) by mouth 3 (three) times daily as needed.    Dispense:  30 tablet    Refill:  0    Order Specific Question:   Supervising Provider    Answer:   VINCENT, CAROL L [[id number]]   Allegra in morning and zytrec at night Avoid scratching Wash clothers on ivory sop, dreft or dove RTO if not improving or worsens  * Consulted with Prudy Feeler, PA Mary-Wanna Daphine Deutscher, FNP  * UPon discharging patient she sad she needed some pain medication- I told he if she continues to take pain meds on regular basis then she need sto be seen for pain management- patient understands. [name] HYDROcodone-acetaminophen (NORCO/VICODIN) 5-325 MG tablet  Sig: Take 1 tablet by mouth every 6 (six) hours as needed. For pain    Dispense:  60 tablet    Refill:  0    Order Specific Question:   Supervising Provider    Answer:   Rex KrasVINCENT, CAROL L [[id number]]

## 2017-05-07 NOTE — Patient Instructions (Signed)

## 2017-05-10 ENCOUNTER — Ambulatory Visit (INDEPENDENT_AMBULATORY_CARE_PROVIDER_SITE_OTHER): Payer: Medicare Other | Admitting: Pharmacist Clinician (PhC)/ Clinical Pharmacy Specialist

## 2017-05-10 DIAGNOSIS — I2510 Atherosclerotic heart disease of native coronary artery without angina pectoris: Secondary | ICD-10-CM

## 2017-05-10 DIAGNOSIS — I714 Abdominal aortic aneurysm, without rupture, unspecified: Secondary | ICD-10-CM

## 2017-05-10 LAB — COAGUCHEK XS/INR WAIVED
INR: 3.8 — AB (ref 0.9–1.1)
PROTHROMBIN TIME: 45.5 s

## 2017-05-10 NOTE — Patient Instructions (Signed)
Anticoagulation Warfarin Dose Instructions as of 05/10/2017      Glynis SmilesSun Mon Tue Wed Thu Fri Sat   New Dose 2 mg 2 mg 2 mg 4 mg 2 mg 2 mg 2 mg    Description   Skip warfarin dose Saturday then follow above directions.  INR today 3.8 (goal: 2.0-3.0) "blood is a little thin today"

## 2017-05-24 ENCOUNTER — Ambulatory Visit (INDEPENDENT_AMBULATORY_CARE_PROVIDER_SITE_OTHER): Payer: Medicare Other | Admitting: Pharmacist Clinician (PhC)/ Clinical Pharmacy Specialist

## 2017-05-24 DIAGNOSIS — I714 Abdominal aortic aneurysm, without rupture, unspecified: Secondary | ICD-10-CM

## 2017-05-24 DIAGNOSIS — I2510 Atherosclerotic heart disease of native coronary artery without angina pectoris: Secondary | ICD-10-CM

## 2017-05-24 LAB — COAGUCHEK XS/INR WAIVED
INR: 4.8 — AB (ref 0.9–1.1)
Prothrombin Time: 57.6 s

## 2017-05-24 NOTE — Patient Instructions (Signed)
Anticoagulation Warfarin Dose Instructions as of 05/24/2017      Maria SmilesSun Mon Tue Wed Thu Fri Sat   New Dose 2 mg 2 mg 2 mg 4 mg 2 mg 2 mg 2 mg    Description   Skip warfarin dose today, tomorrow, and Sunday.  Then take 1/2 tablet a day until your next appointment.  INR today 4.8 (goal: 2.0-3.0)

## 2017-05-28 ENCOUNTER — Ambulatory Visit (INDEPENDENT_AMBULATORY_CARE_PROVIDER_SITE_OTHER): Payer: Medicare Other | Admitting: Pharmacist

## 2017-05-28 DIAGNOSIS — I714 Abdominal aortic aneurysm, without rupture, unspecified: Secondary | ICD-10-CM

## 2017-05-28 DIAGNOSIS — I2510 Atherosclerotic heart disease of native coronary artery without angina pectoris: Secondary | ICD-10-CM

## 2017-05-28 DIAGNOSIS — Z7901 Long term (current) use of anticoagulants: Secondary | ICD-10-CM

## 2017-05-28 LAB — COAGUCHEK XS/INR WAIVED
INR: 4 — AB (ref 0.9–1.1)
Prothrombin Time: 48.2 s

## 2017-05-28 MED ORDER — WARFARIN SODIUM 1 MG PO TABS: 1.0000 mg | ORAL_TABLET | Freq: Every day | ORAL | 1 refills | Status: AC

## 2017-05-28 NOTE — Patient Instructions (Signed)
Anticoagulation Warfarin Dose Instructions as of 05/28/2017      Maria SmilesSun Mon Tue Wed Thu Fri Sat   This week (05/28/2017 thru 06/01/2017) 0 mg 2 mg 2 mg 0 mg 0 mg 2 mg 1 mg    Next week  (06/02/2017 through 06/08/2017) 1 mg 2 mg 1 mg 2 mg 1 mg 2 mg 1 mg    Description   No warfarin tomorrow (05/29/17) and Thursday (05/30/2017).  Then decrease warfarin dose to 2mg  on mondays, wednesdays and fridays. Take 1mg  all other days.  INR today 4.0 (goal: 2.0-3.0)

## 2017-06-05 ENCOUNTER — Ambulatory Visit (INDEPENDENT_AMBULATORY_CARE_PROVIDER_SITE_OTHER): Payer: Medicare Other | Admitting: Pharmacist

## 2017-06-05 DIAGNOSIS — I714 Abdominal aortic aneurysm, without rupture, unspecified: Secondary | ICD-10-CM

## 2017-06-05 DIAGNOSIS — Z7901 Long term (current) use of anticoagulants: Secondary | ICD-10-CM

## 2017-06-05 LAB — COAGUCHEK XS/INR WAIVED
INR: 2.3 — ABNORMAL HIGH (ref 0.9–1.1)
Prothrombin Time: 27.2 s

## 2017-06-05 NOTE — Addendum Note (Signed)
Addended by: Henrene PastorECKARD, Wesleigh Markovic B on: 06/05/2017 12:16 PM   Modules accepted: Level of Service

## 2017-06-14 ENCOUNTER — Ambulatory Visit (INDEPENDENT_AMBULATORY_CARE_PROVIDER_SITE_OTHER): Payer: Medicare Other | Admitting: Pharmacist Clinician (PhC)/ Clinical Pharmacy Specialist

## 2017-06-14 DIAGNOSIS — I714 Abdominal aortic aneurysm, without rupture, unspecified: Secondary | ICD-10-CM

## 2017-06-14 LAB — COAGUCHEK XS/INR WAIVED
INR: 7.6 (ref 0.9–1.1)
Prothrombin Time: 90.6 s

## 2017-06-14 LAB — HEMOGLOBIN, FINGERSTICK: HEMOGLOBIN: 12.5 g/dL (ref 11.1–15.9)

## 2017-06-14 NOTE — Patient Instructions (Signed)
Anticoagulation Warfarin Dose Instructions as of 06/14/2017      Maria SmilesSun Mon Tue Wed Thu Fri Sat   New Dose 0 mg 0 mg 1 mg 2 mg 1 mg 2 mg 0 mg    Description   No warfarin Saturday and Sunday.  Eat high vitamin K foods today through the weekend.  Call if you have any bleeding or bruising.  INR today 7.6  (goal: 2.0-3.0)  Very thin today

## 2017-06-15 ENCOUNTER — Telehealth: Payer: Self-pay | Admitting: Pharmacist Clinician (PhC)/ Clinical Pharmacy Specialist

## 2017-06-15 LAB — PROTIME-INR
INR: 5.1 — ABNORMAL HIGH (ref 0.8–1.2)
Prothrombin Time: 48.1 s — ABNORMAL HIGH (ref 9.1–12.0)

## 2017-06-15 NOTE — Telephone Encounter (Signed)
Patient was called this morning with INR results that were sent out.  INR was considerably lower than in office.  5.1 versus 7.6.  Reason for elevated INR is unknown.  I have ordered labs for Monday to determine if there are any underlying causes to this fluctuation.  [name] has no symptoms of bleeding or bruising.  I told patient to call if any arise.  She is holding warfarin until her INR is re-checked on Monday.  She is consuming high vitamin K foods over the weekend and is taking precautions against falling.  The only change in her health status is a rash that is being treated by Gennette PacMary Ridley with hydroxyzine and is improving per patient.

## 2017-06-17 ENCOUNTER — Ambulatory Visit (INDEPENDENT_AMBULATORY_CARE_PROVIDER_SITE_OTHER): Payer: Medicare Other | Admitting: Pharmacist

## 2017-06-17 DIAGNOSIS — R791 Abnormal coagulation profile: Secondary | ICD-10-CM

## 2017-06-17 DIAGNOSIS — I714 Abdominal aortic aneurysm, without rupture, unspecified: Secondary | ICD-10-CM

## 2017-06-17 DIAGNOSIS — I2510 Atherosclerotic heart disease of native coronary artery without angina pectoris: Secondary | ICD-10-CM

## 2017-06-17 LAB — COAGUCHEK XS/INR WAIVED
INR: 3.1 — ABNORMAL HIGH (ref 0.9–1.1)
PROTHROMBIN TIME: 36.8 s

## 2017-06-17 NOTE — Patient Instructions (Signed)
Anticoagulation Warfarin Dose Instructions as of 06/17/2017      Maria SmilesSun Mon Tue Wed Thu Fri Sat   New Dose   0 mg  none 1 mg  1 tablet (pink) 1 mg  1 tablet (pink) 1 mg  1 tablet (pink) 1 mg  1 tablet (pink)    Description   Skip warfarin tomorrow (06/18/2017).  Then start new warfarin dose - take warfarin 1mg  tablets (pink tablets).  Take 1 tablet daily  INR today 3.1  (goal: 2.0-3.0)

## 2017-06-17 NOTE — Progress Notes (Signed)
Patient ID: Gordy ClementMargaret Wiesman, female   DOB: [birth year], 83 y.o.   MRN: [id number]

## 2017-06-18 LAB — CMP14+EGFR
A/G RATIO: 1.3 (ref 1.2–2.2)
ALK PHOS: 96 IU/L (ref 39–117)
ALT: 15 IU/L (ref 0–32)
AST: 21 IU/L (ref 0–40)
Albumin: 3.6 g/dL (ref 3.5–4.7)
BUN / CREAT RATIO: 12 (ref 12–28)
BUN: 21 mg/dL (ref 8–27)
Bilirubin Total: 1.1 mg/dL (ref 0.0–1.2)
CALCIUM: 9.2 mg/dL (ref 8.7–10.3)
CO2: 20 mmol/L (ref 20–29)
Chloride: 100 mmol/L (ref 96–106)
Creatinine, Ser: 1.74 mg/dL — ABNORMAL HIGH (ref 0.57–1.00)
GFR calc Af Amer: 31 mL/min/{1.73_m2} — ABNORMAL LOW (ref >59)
GFR, EST NON AFRICAN AMERICAN: 27 mL/min/{1.73_m2} — AB (ref >59)
GLOBULIN, TOTAL: 2.7 g/dL (ref 1.5–4.5)
Glucose: 95 mg/dL (ref 65–99)
Potassium: 5.2 mmol/L (ref 3.5–5.2)
Sodium: 137 mmol/L (ref 134–144)
Total Protein: 6.3 g/dL (ref 6.0–8.5)

## 2017-06-18 LAB — THYROID PANEL WITH TSH
Free Thyroxine Index: 1.9 (ref 1.2–4.9)
T3 Uptake Ratio: 26 % (ref 24–39)
T4 TOTAL: 7.4 ug/dL (ref 4.5–12.0)
TSH: 2.01 u[IU]/mL (ref 0.450–4.500)

## 2017-06-21 ENCOUNTER — Other Ambulatory Visit: Payer: Self-pay | Admitting: Pharmacist

## 2017-06-21 ENCOUNTER — Ambulatory Visit (INDEPENDENT_AMBULATORY_CARE_PROVIDER_SITE_OTHER): Payer: Medicare Other | Admitting: Pharmacist Clinician (PhC)/ Clinical Pharmacy Specialist

## 2017-06-21 DIAGNOSIS — I2510 Atherosclerotic heart disease of native coronary artery without angina pectoris: Secondary | ICD-10-CM

## 2017-06-21 DIAGNOSIS — I714 Abdominal aortic aneurysm, without rupture, unspecified: Secondary | ICD-10-CM

## 2017-06-21 DIAGNOSIS — N184 Chronic kidney disease, stage 4 (severe): Secondary | ICD-10-CM

## 2017-06-21 LAB — COAGUCHEK XS/INR WAIVED
INR: 1.4 — AB (ref 0.9–1.1)
Prothrombin Time: 17.4 s

## 2017-06-21 NOTE — Patient Instructions (Signed)
Anticoagulation Warfarin Dose Instructions as of 06/21/2017      Maria SmilesSun Mon Tue Wed Thu Fri Sat   New Dose 1 mg 2 mg 1 mg 2 mg 1 mg 2 mg 1 mg    Description   Take 2 tablets(2mg ) mondays, Wednesdays, and Fridays and 1 tablet (1mg ) all other days of the week.  Only use pink 1mg  tablets.  INR today 1.4  (goal: 2.0-3.0)

## 2017-07-05 ENCOUNTER — Encounter: Payer: Self-pay | Admitting: Family Medicine

## 2017-07-05 ENCOUNTER — Ambulatory Visit (INDEPENDENT_AMBULATORY_CARE_PROVIDER_SITE_OTHER): Payer: Medicare Other | Admitting: Family Medicine

## 2017-07-05 ENCOUNTER — Ambulatory Visit (INDEPENDENT_AMBULATORY_CARE_PROVIDER_SITE_OTHER): Payer: Medicare Other | Admitting: Pharmacist Clinician (PhC)/ Clinical Pharmacy Specialist

## 2017-07-05 VITALS — BP 112/67 | HR 114 | Temp 97.9°F | Ht 64.0 in | Wt 144.0 lb

## 2017-07-05 DIAGNOSIS — L280 Lichen simplex chronicus: Secondary | ICD-10-CM | POA: Diagnosis not present

## 2017-07-05 DIAGNOSIS — I714 Abdominal aortic aneurysm, without rupture, unspecified: Secondary | ICD-10-CM

## 2017-07-05 DIAGNOSIS — I2510 Atherosclerotic heart disease of native coronary artery without angina pectoris: Secondary | ICD-10-CM | POA: Diagnosis not present

## 2017-07-05 DIAGNOSIS — T50905D Adverse effect of unspecified drugs, medicaments and biological substances, subsequent encounter: Secondary | ICD-10-CM

## 2017-07-05 DIAGNOSIS — T887XXD Unspecified adverse effect of drug or medicament, subsequent encounter: Secondary | ICD-10-CM | POA: Diagnosis not present

## 2017-07-05 LAB — COAGUCHEK XS/INR WAIVED
INR: 1.1 (ref 0.9–1.1)
Prothrombin Time: 12.8 s

## 2017-07-05 MED ORDER — METHYLPREDNISOLONE ACETATE 80 MG/ML IJ SUSP
80.0000 mg | Freq: Once | INTRAMUSCULAR | Status: AC
  Administered 2017-07-05: 80 mg via INTRAMUSCULAR

## 2017-07-05 MED ORDER — TRIAMTERENE 50 MG PO CAPS: 50.0000 mg | ORAL_CAPSULE | Freq: Every day | ORAL | 1 refills | Status: DC

## 2017-07-05 MED ORDER — PREDNISONE 20 MG PO TABS: ORAL_TABLET | ORAL | 0 refills | Status: DC

## 2017-07-05 NOTE — Progress Notes (Signed)
BP 112/67   Pulse (!) 114   Temp 97.9 F (36.6 C) (Oral)   Ht 5\' 4"  (1.626 m)   Wt 144 lb (65.3 kg)   BMI 24.72 kg/m    Subjective:    Patient ID: [name], female    DOB: May 01, 1932, 83 y.o.   MRN: [id number]  HPI: Maria ClementMargaret Caliendo is a 83 y.o. female presenting on 07/05/2017 for Rash (x 2 months)   HPI Rash Patient is coming in today because she's been having this rash that she's been fighting for 2 months area and she was seen on 05/07/2017 by one of my colleagues for this issue. She was given at that time Zantac and hydroxyzine and she took all of the hydroxyzine and still taking the Zantac and does not feel like there is been an improvement. She has a lot of pruritus from the sites and does not feel like anything has been helping it to this point. The rash is all on her chest or back her legs and her arms. She denies any on her palms or soles of her feet or on her face or genital region. The rash is mostly small spots that she has scratched off and then scab over.  Relevant past medical, surgical, family and social history reviewed and updated as indicated. Interim medical history since our last visit reviewed. Allergies and medications reviewed and updated.  Review of Systems  Constitutional: Negative for chills and fever.  Respiratory: Negative for chest tightness and shortness of breath.   Cardiovascular: Negative for chest pain and leg swelling.  Musculoskeletal: Negative for back pain and gait problem.  Skin: Positive for rash.  Neurological: Negative for light-headedness and headaches.  Psychiatric/Behavioral: Negative for agitation and behavioral problems.  All other systems reviewed and are negative.   Per HPI unless specifically indicated above      Objective:    BP 112/67   Pulse (!) 114   Temp 97.9 F (36.6 C) (Oral)   Ht 5\' 4"  (1.626 m)   Wt 144 lb (65.3 kg)   BMI 24.72 kg/m   Wt Readings from Last 3 Encounters:  07/05/17 144 lb (65.3 kg)    05/07/17 150 lb (68 kg)  02/08/17 150 lb (68 kg)    Physical Exam  Constitutional: She is oriented to person, place, and time. She appears well-developed and well-nourished. No distress.  Eyes: Conjunctivae are normal.  Cardiovascular: Normal rate, regular rhythm, normal heart sounds and intact distal pulses.   No murmur heard. Pulmonary/Chest: Effort normal and breath sounds normal. No respiratory distress. She has no wheezes. She has no rales.  Musculoskeletal: Normal range of motion.  Neurological: She is alert and oriented to person, place, and time. Coordination normal.  Skin: Skin is warm and dry. Rash noted. Rash is papular (Papular rash with excoriations). She is not diaphoretic.  Psychiatric: She has a normal mood and affect. Her behavior is normal.  Nursing note and vitals reviewed.             Assessment & Plan:   Problem List Items Addressed This Visit    None    Visit Diagnoses    Adverse effect of drug, subsequent encounter    -  Primary   Concern for possible drug skin eruption versus neurodermatitis, will give prednisone and Depo-Medrol and stopped Maxide   Relevant Medications   methylPREDNISolone acetate (DEPO-MEDROL) injection 80 mg (Completed)   predniSONE (DELTASONE) 20 MG tablet   Neurodermatitis  Relevant Medications   methylPREDNISolone acetate (DEPO-MEDROL) injection 80 mg (Completed)   predniSONE (DELTASONE) 20 MG tablet       Follow up plan: Return in about 1 week (around 07/12/2017), or if symptoms worsen or fail to improve, for Recheck rash.  Counseling provided for all of the vaccine components No orders of the defined types were placed in this encounter.   Arville CareJoshua Nochum Fenter, MD [facility] 07/05/2017, 4:12 PM

## 2017-07-05 NOTE — Patient Instructions (Signed)
Stop triamterene-hydrochlorothiazide   Sent a new prescription with just triamterene to replace it

## 2017-07-05 NOTE — Patient Instructions (Signed)
Anticoagulation Warfarin Dose Instructions as of 07/05/2017      Glynis SmilesSun Mon Tue Wed Thu Fri Sat   New Dose 2 mg 2 mg 2 mg 2 mg 2 mg 2 mg 2 mg    Description   Take 2 tablets every day of the week.  INR today 1.1  (goal: 2.0-3.0)

## 2017-07-08 ENCOUNTER — Other Ambulatory Visit: Payer: Self-pay | Admitting: *Deleted

## 2017-07-08 MED ORDER — SPIRONOLACTONE 50 MG PO TABS: 50.0000 mg | ORAL_TABLET | Freq: Every day | ORAL | 3 refills | Status: DC

## 2017-07-08 NOTE — Telephone Encounter (Signed)
Can we send her in spironolactone 50 mg daily for now, give her 1 month's worth with 3 refills

## 2017-07-08 NOTE — Telephone Encounter (Signed)
[place]  Triamterene is > $130 Can this be changed to a less expensive med Please advise

## 2017-07-10 ENCOUNTER — Emergency Department (HOSPITAL_COMMUNITY): Payer: Medicare Other

## 2017-07-10 ENCOUNTER — Observation Stay (HOSPITAL_COMMUNITY): Payer: Medicare Other

## 2017-07-10 ENCOUNTER — Inpatient Hospital Stay (HOSPITAL_COMMUNITY)
Admission: EM | Admit: 2017-07-10 | Discharge: 2017-07-15 | DRG: 271 | Disposition: A | Discharge status: Home Health | Payer: Medicare Other | Attending: Cardiology | Admitting: Cardiology

## 2017-07-10 ENCOUNTER — Encounter (HOSPITAL_COMMUNITY): Payer: Self-pay | Admitting: Emergency Medicine

## 2017-07-10 ENCOUNTER — Inpatient Hospital Stay (HOSPITAL_COMMUNITY)
Admission: EM | Disposition: A | Discharge status: Home Health | Payer: Self-pay | Source: Home / Self Care | Attending: Cardiology

## 2017-07-10 DIAGNOSIS — I679 Cerebrovascular disease, unspecified: Secondary | ICD-10-CM | POA: Diagnosis present

## 2017-07-10 DIAGNOSIS — J9811 Atelectasis: Secondary | ICD-10-CM | POA: Diagnosis not present

## 2017-07-10 DIAGNOSIS — R079 Chest pain, unspecified: Secondary | ICD-10-CM | POA: Insufficient documentation

## 2017-07-10 DIAGNOSIS — I2510 Atherosclerotic heart disease of native coronary artery without angina pectoris: Secondary | ICD-10-CM | POA: Diagnosis not present

## 2017-07-10 DIAGNOSIS — I214 Non-ST elevation (NSTEMI) myocardial infarction: Principal | ICD-10-CM | POA: Diagnosis present

## 2017-07-10 DIAGNOSIS — R0682 Tachypnea, not elsewhere classified: Secondary | ICD-10-CM | POA: Diagnosis not present

## 2017-07-10 DIAGNOSIS — Z8711 Personal history of peptic ulcer disease: Secondary | ICD-10-CM | POA: Diagnosis not present

## 2017-07-10 DIAGNOSIS — K222 Esophageal obstruction: Secondary | ICD-10-CM

## 2017-07-10 DIAGNOSIS — I25110 Atherosclerotic heart disease of native coronary artery with unstable angina pectoris: Secondary | ICD-10-CM | POA: Diagnosis present

## 2017-07-10 DIAGNOSIS — I252 Old myocardial infarction: Secondary | ICD-10-CM

## 2017-07-10 DIAGNOSIS — I493 Ventricular premature depolarization: Secondary | ICD-10-CM | POA: Diagnosis present

## 2017-07-10 DIAGNOSIS — Z90710 Acquired absence of both cervix and uterus: Secondary | ICD-10-CM

## 2017-07-10 DIAGNOSIS — J449 Chronic obstructive pulmonary disease, unspecified: Secondary | ICD-10-CM | POA: Diagnosis present

## 2017-07-10 DIAGNOSIS — Z7901 Long term (current) use of anticoagulants: Secondary | ICD-10-CM

## 2017-07-10 DIAGNOSIS — E785 Hyperlipidemia, unspecified: Secondary | ICD-10-CM | POA: Diagnosis present

## 2017-07-10 DIAGNOSIS — Z95828 Presence of other vascular implants and grafts: Secondary | ICD-10-CM

## 2017-07-10 DIAGNOSIS — R7989 Other specified abnormal findings of blood chemistry: Secondary | ICD-10-CM

## 2017-07-10 DIAGNOSIS — N184 Chronic kidney disease, stage 4 (severe): Secondary | ICD-10-CM | POA: Diagnosis present

## 2017-07-10 DIAGNOSIS — M545 Low back pain: Secondary | ICD-10-CM | POA: Diagnosis present

## 2017-07-10 DIAGNOSIS — Z951 Presence of aortocoronary bypass graft: Secondary | ICD-10-CM

## 2017-07-10 DIAGNOSIS — I119 Hypertensive heart disease without heart failure: Secondary | ICD-10-CM | POA: Diagnosis present

## 2017-07-10 DIAGNOSIS — I2584 Coronary atherosclerosis due to calcified coronary lesion: Secondary | ICD-10-CM | POA: Diagnosis not present

## 2017-07-10 DIAGNOSIS — K219 Gastro-esophageal reflux disease without esophagitis: Secondary | ICD-10-CM

## 2017-07-10 DIAGNOSIS — H5461 Unqualified visual loss, right eye, normal vision left eye: Secondary | ICD-10-CM | POA: Diagnosis present

## 2017-07-10 DIAGNOSIS — Z8249 Family history of ischemic heart disease and other diseases of the circulatory system: Secondary | ICD-10-CM | POA: Diagnosis not present

## 2017-07-10 DIAGNOSIS — M810 Age-related osteoporosis without current pathological fracture: Secondary | ICD-10-CM | POA: Diagnosis present

## 2017-07-10 DIAGNOSIS — R51 Headache: Secondary | ICD-10-CM | POA: Diagnosis not present

## 2017-07-10 DIAGNOSIS — I213 ST elevation (STEMI) myocardial infarction of unspecified site: Secondary | ICD-10-CM | POA: Diagnosis not present

## 2017-07-10 DIAGNOSIS — R570 Cardiogenic shock: Secondary | ICD-10-CM | POA: Diagnosis not present

## 2017-07-10 DIAGNOSIS — J410 Simple chronic bronchitis: Secondary | ICD-10-CM | POA: Diagnosis not present

## 2017-07-10 DIAGNOSIS — R0602 Shortness of breath: Secondary | ICD-10-CM | POA: Diagnosis not present

## 2017-07-10 DIAGNOSIS — Z9889 Other specified postprocedural states: Secondary | ICD-10-CM

## 2017-07-10 DIAGNOSIS — R55 Syncope and collapse: Secondary | ICD-10-CM | POA: Diagnosis not present

## 2017-07-10 DIAGNOSIS — Z87891 Personal history of nicotine dependence: Secondary | ICD-10-CM

## 2017-07-10 DIAGNOSIS — I739 Peripheral vascular disease, unspecified: Secondary | ICD-10-CM | POA: Diagnosis present

## 2017-07-10 DIAGNOSIS — I7389 Other specified peripheral vascular diseases: Secondary | ICD-10-CM | POA: Diagnosis not present

## 2017-07-10 DIAGNOSIS — Z8673 Personal history of transient ischemic attack (TIA), and cerebral infarction without residual deficits: Secondary | ICD-10-CM

## 2017-07-10 DIAGNOSIS — Z86711 Personal history of pulmonary embolism: Secondary | ICD-10-CM

## 2017-07-10 DIAGNOSIS — Z66 Do not resuscitate: Secondary | ICD-10-CM | POA: Diagnosis present

## 2017-07-10 DIAGNOSIS — G629 Polyneuropathy, unspecified: Secondary | ICD-10-CM | POA: Diagnosis present

## 2017-07-10 DIAGNOSIS — I350 Nonrheumatic aortic (valve) stenosis: Secondary | ICD-10-CM | POA: Diagnosis present

## 2017-07-10 DIAGNOSIS — J90 Pleural effusion, not elsewhere classified: Secondary | ICD-10-CM | POA: Diagnosis not present

## 2017-07-10 DIAGNOSIS — Z8261 Family history of arthritis: Secondary | ICD-10-CM | POA: Diagnosis not present

## 2017-07-10 DIAGNOSIS — I6990 Unspecified sequelae of unspecified cerebrovascular disease: Secondary | ICD-10-CM

## 2017-07-10 DIAGNOSIS — I253 Aneurysm of heart: Secondary | ICD-10-CM | POA: Diagnosis present

## 2017-07-10 DIAGNOSIS — R748 Abnormal levels of other serum enzymes: Secondary | ICD-10-CM | POA: Diagnosis not present

## 2017-07-10 DIAGNOSIS — Z888 Allergy status to other drugs, medicaments and biological substances status: Secondary | ICD-10-CM

## 2017-07-10 DIAGNOSIS — N179 Acute kidney failure, unspecified: Secondary | ICD-10-CM | POA: Diagnosis not present

## 2017-07-10 DIAGNOSIS — R778 Other specified abnormalities of plasma proteins: Secondary | ICD-10-CM | POA: Insufficient documentation

## 2017-07-10 DIAGNOSIS — Z79891 Long term (current) use of opiate analgesic: Secondary | ICD-10-CM

## 2017-07-10 DIAGNOSIS — M109 Gout, unspecified: Secondary | ICD-10-CM | POA: Diagnosis present

## 2017-07-10 DIAGNOSIS — I1310 Hypertensive heart and chronic kidney disease without heart failure, with stage 1 through stage 4 chronic kidney disease, or unspecified chronic kidney disease: Secondary | ICD-10-CM | POA: Diagnosis present

## 2017-07-10 DIAGNOSIS — Z7952 Long term (current) use of systemic steroids: Secondary | ICD-10-CM

## 2017-07-10 DIAGNOSIS — R011 Cardiac murmur, unspecified: Secondary | ICD-10-CM | POA: Diagnosis present

## 2017-07-10 DIAGNOSIS — Z79899 Other long term (current) drug therapy: Secondary | ICD-10-CM

## 2017-07-10 DIAGNOSIS — Z886 Allergy status to analgesic agent status: Secondary | ICD-10-CM

## 2017-07-10 DIAGNOSIS — R0789 Other chest pain: Secondary | ICD-10-CM | POA: Diagnosis not present

## 2017-07-10 DIAGNOSIS — G8929 Other chronic pain: Secondary | ICD-10-CM | POA: Diagnosis present

## 2017-07-10 HISTORY — PX: LEFT HEART CATH AND CORONARY ANGIOGRAPHY: CATH118249

## 2017-07-10 HISTORY — PX: IABP INSERTION: CATH118242

## 2017-07-10 LAB — BASIC METABOLIC PANEL
ANION GAP: 8 (ref 5–15)
BUN: 25 mg/dL — ABNORMAL HIGH (ref 6–20)
CO2: 21 mmol/L — ABNORMAL LOW (ref 22–32)
Calcium: 8.2 mg/dL — ABNORMAL LOW (ref 8.9–10.3)
Chloride: 104 mmol/L (ref 101–111)
Creatinine, Ser: 1.58 mg/dL — ABNORMAL HIGH (ref 0.44–1.00)
GFR, EST AFRICAN AMERICAN: 34 mL/min — AB (ref >60)
GFR, EST NON AFRICAN AMERICAN: 29 mL/min — AB (ref >60)
GLUCOSE: 127 mg/dL — AB (ref 65–99)
POTASSIUM: 4.5 mmol/L (ref 3.5–5.1)
Sodium: 133 mmol/L — ABNORMAL LOW (ref 135–145)

## 2017-07-10 LAB — CBC WITH DIFFERENTIAL/PLATELET
BASOS PCT: 0 %
Basophils Absolute: 0 10*3/uL (ref 0.0–0.1)
Eosinophils Absolute: 0 10*3/uL (ref 0.0–0.7)
Eosinophils Relative: 0 %
HEMATOCRIT: 33.9 % — AB (ref 36.0–46.0)
HEMOGLOBIN: 11.2 g/dL — AB (ref 12.0–15.0)
LYMPHS ABS: 0.5 10*3/uL — AB (ref 0.7–4.0)
Lymphocytes Relative: 4 %
MCH: 30.9 pg (ref 26.0–34.0)
MCHC: 33 g/dL (ref 30.0–36.0)
MCV: 93.6 fL (ref 78.0–100.0)
MONOS PCT: 4 %
Monocytes Absolute: 0.5 10*3/uL (ref 0.1–1.0)
NEUTROS ABS: 11.7 10*3/uL — AB (ref 1.7–7.7)
NEUTROS PCT: 92 %
Platelets: 271 10*3/uL (ref 150–400)
RBC: 3.62 MIL/uL — AB (ref 3.87–5.11)
RDW: 14.6 % (ref 11.5–15.5)
WBC: 12.7 10*3/uL — ABNORMAL HIGH (ref 4.0–10.5)

## 2017-07-10 LAB — URINALYSIS, ROUTINE W REFLEX MICROSCOPIC
Bilirubin Urine: NEGATIVE
GLUCOSE, UA: NEGATIVE mg/dL
Hgb urine dipstick: NEGATIVE
KETONES UR: NEGATIVE mg/dL
Leukocytes, UA: NEGATIVE
Nitrite: NEGATIVE
PH: 7 (ref 5.0–8.0)
Protein, ur: NEGATIVE mg/dL
Specific Gravity, Urine: 1.004 — ABNORMAL LOW (ref 1.005–1.030)

## 2017-07-10 LAB — TROPONIN I: Troponin I: 0.81 ng/mL (ref <0.03)

## 2017-07-10 LAB — D-DIMER, QUANTITATIVE (NOT AT ARMC): D DIMER QUANT: 3.34 ug{FEU}/mL — AB (ref 0.00–0.50)

## 2017-07-10 LAB — PROTIME-INR
INR: 0.98
Prothrombin Time: 12.9 seconds (ref 11.4–15.2)

## 2017-07-10 LAB — I-STAT TROPONIN, ED: TROPONIN I, POC: 0.69 ng/mL — AB (ref 0.00–0.08)

## 2017-07-10 SURGERY — LEFT HEART CATH AND CORONARY ANGIOGRAPHY
Anesthesia: LOCAL

## 2017-07-10 MED ORDER — SODIUM CHLORIDE 0.9 % IV SOLN
INTRAVENOUS | Status: DC
  Administered 2017-07-10: 18:00:00 via INTRAVENOUS

## 2017-07-10 MED ORDER — HYDROCODONE-ACETAMINOPHEN 5-325 MG PO TABS
1.0000 | ORAL_TABLET | Freq: Four times a day (QID) | ORAL | Status: DC | PRN
  Administered 2017-07-12 (×2): 1 via ORAL
  Filled 2017-07-10 (×2): qty 1

## 2017-07-10 MED ORDER — FENTANYL CITRATE (PF) 100 MCG/2ML IJ SOLN
INTRAMUSCULAR | Status: AC
  Filled 2017-07-10: qty 2

## 2017-07-10 MED ORDER — SODIUM CHLORIDE 0.9 % IV BOLUS (SEPSIS)
500.0000 mL | Freq: Once | INTRAVENOUS | Status: AC
  Administered 2017-07-10: 500 mL via INTRAVENOUS

## 2017-07-10 MED ORDER — TECHNETIUM TC 99M DIETHYLENETRIAME-PENTAACETIC ACID: 32.0000 | Freq: Once | INTRAVENOUS | Status: DC | PRN

## 2017-07-10 MED ORDER — WARFARIN SODIUM 3 MG PO TABS: 3.0000 mg | ORAL_TABLET | Freq: Every day | ORAL | Status: DC

## 2017-07-10 MED ORDER — SODIUM CHLORIDE 0.9 % IV SOLN: INTRAVENOUS | Status: AC

## 2017-07-10 MED ORDER — SODIUM CHLORIDE 0.9% FLUSH
3.0000 mL | Freq: Two times a day (BID) | INTRAVENOUS | Status: DC
  Administered 2017-07-11 – 2017-07-15 (×6): 3 mL via INTRAVENOUS

## 2017-07-10 MED ORDER — AMLODIPINE BESYLATE 5 MG PO TABS: 5.0000 mg | ORAL_TABLET | Freq: Every day | ORAL | Status: DC

## 2017-07-10 MED ORDER — NITROGLYCERIN IN D5W 200-5 MCG/ML-% IV SOLN
INTRAVENOUS | Status: AC
  Filled 2017-07-10: qty 250

## 2017-07-10 MED ORDER — ATORVASTATIN CALCIUM 40 MG PO TABS
40.0000 mg | ORAL_TABLET | Freq: Every day | ORAL | Status: DC
  Administered 2017-07-11 – 2017-07-15 (×5): 40 mg via ORAL
  Filled 2017-07-10 (×5): qty 1

## 2017-07-10 MED ORDER — HEPARIN BOLUS VIA INFUSION
4000.0000 [IU] | Freq: Once | INTRAVENOUS | Status: AC
  Administered 2017-07-10: 4000 [IU] via INTRAVENOUS
  Filled 2017-07-10: qty 4000

## 2017-07-10 MED ORDER — FENTANYL CITRATE (PF) 100 MCG/2ML IJ SOLN
INTRAMUSCULAR | Status: DC | PRN
  Administered 2017-07-10: 25 ug via INTRAVENOUS

## 2017-07-10 MED ORDER — NITROGLYCERIN IN D5W 200-5 MCG/ML-% IV SOLN
INTRAVENOUS | Status: AC | PRN
  Administered 2017-07-10: 10 ug/min via INTRAVENOUS

## 2017-07-10 MED ORDER — ACETAMINOPHEN 650 MG RE SUPP: 650.0000 mg | Freq: Four times a day (QID) | RECTAL | Status: DC | PRN

## 2017-07-10 MED ORDER — ACETAMINOPHEN 325 MG PO TABS
650.0000 mg | ORAL_TABLET | ORAL | Status: DC | PRN
Start: 2017-07-10 — End: 2017-07-15

## 2017-07-10 MED ORDER — SPIRONOLACTONE 50 MG PO TABS: 50.0000 mg | ORAL_TABLET | Freq: Every day | ORAL | Status: DC

## 2017-07-10 MED ORDER — ONDANSETRON HCL 4 MG/2ML IJ SOLN: 4.0000 mg | Freq: Four times a day (QID) | INTRAMUSCULAR | Status: DC | PRN

## 2017-07-10 MED ORDER — PANTOPRAZOLE SODIUM 40 MG PO TBEC
40.0000 mg | DELAYED_RELEASE_TABLET | Freq: Every day | ORAL | Status: DC
  Administered 2017-07-11 – 2017-07-15 (×5): 40 mg via ORAL
  Filled 2017-07-10 (×5): qty 1

## 2017-07-10 MED ORDER — ACETAMINOPHEN 325 MG PO TABS: 650.0000 mg | ORAL_TABLET | Freq: Four times a day (QID) | ORAL | Status: DC | PRN

## 2017-07-10 MED ORDER — TECHNETIUM TO 99M ALBUMIN AGGREGATED
4.0000 | Freq: Once | INTRAVENOUS | Status: AC | PRN
  Administered 2017-07-10: 4 via INTRAVENOUS

## 2017-07-10 MED ORDER — SODIUM CHLORIDE 0.9 % IV SOLN: 250.0000 mL | INTRAVENOUS | Status: DC | PRN

## 2017-07-10 MED ORDER — WARFARIN - PHARMACIST DOSING INPATIENT
Freq: Every day | Status: DC
Start: 2017-07-10 — End: 2017-07-10

## 2017-07-10 MED ORDER — VERAPAMIL HCL 2.5 MG/ML IV SOLN
INTRAVENOUS | Status: AC
  Filled 2017-07-10: qty 2

## 2017-07-10 MED ORDER — WARFARIN SODIUM 3 MG PO TABS
3.0000 mg | ORAL_TABLET | Freq: Every day | ORAL | Status: DC
  Filled 2017-07-10: qty 1

## 2017-07-10 MED ORDER — SODIUM CHLORIDE 0.9% FLUSH: 3.0000 mL | INTRAVENOUS | Status: DC | PRN

## 2017-07-10 MED ORDER — MORPHINE SULFATE (PF) 4 MG/ML IV SOLN
2.0000 mg | Freq: Once | INTRAVENOUS | Status: AC
  Administered 2017-07-10: 2 mg via INTRAVENOUS
  Filled 2017-07-10: qty 1

## 2017-07-10 MED ORDER — METOPROLOL TARTRATE 50 MG PO TABS: 50.0000 mg | ORAL_TABLET | Freq: Every day | ORAL | Status: DC

## 2017-07-10 MED ORDER — FAMOTIDINE 20 MG PO TABS
20.0000 mg | ORAL_TABLET | Freq: Every day | ORAL | Status: DC
  Administered 2017-07-11 – 2017-07-15 (×5): 20 mg via ORAL
  Filled 2017-07-10 (×5): qty 1

## 2017-07-10 MED ORDER — AMLODIPINE BESYLATE 10 MG PO TABS
10.0000 mg | ORAL_TABLET | Freq: Every day | ORAL | Status: DC
  Administered 2017-07-11 – 2017-07-15 (×5): 10 mg via ORAL
  Filled 2017-07-10 (×5): qty 1

## 2017-07-10 MED ORDER — ONDANSETRON HCL 4 MG/2ML IJ SOLN
4.0000 mg | Freq: Once | INTRAMUSCULAR | Status: AC
  Administered 2017-07-10: 4 mg via INTRAVENOUS
  Filled 2017-07-10: qty 2

## 2017-07-10 MED ORDER — HEPARIN (PORCINE) IN NACL 100-0.45 UNIT/ML-% IJ SOLN
800.0000 [IU]/h | INTRAMUSCULAR | Status: DC
  Administered 2017-07-10: 800 [IU]/h via INTRAVENOUS
  Filled 2017-07-10 (×2): qty 250

## 2017-07-10 MED ORDER — NITROGLYCERIN IN D5W 200-5 MCG/ML-% IV SOLN
0.0000 ug/min | INTRAVENOUS | Status: DC
  Administered 2017-07-11: 35 ug/min via INTRAVENOUS
  Filled 2017-07-10 (×2): qty 250

## 2017-07-10 MED ORDER — HEPARIN (PORCINE) IN NACL 2-0.9 UNIT/ML-% IJ SOLN
INTRAMUSCULAR | Status: AC | PRN
  Administered 2017-07-10: 1000 mL

## 2017-07-10 MED ORDER — HEPARIN (PORCINE) IN NACL 2-0.9 UNIT/ML-% IJ SOLN
INTRAMUSCULAR | Status: AC
  Filled 2017-07-10: qty 1000

## 2017-07-10 MED ORDER — LIDOCAINE HCL (PF) 1 % IJ SOLN
INTRAMUSCULAR | Status: AC
  Filled 2017-07-10: qty 30

## 2017-07-10 MED ORDER — TRIAMTERENE-HCTZ 37.5-25 MG PO TABS: 1.0000 | ORAL_TABLET | Freq: Every day | ORAL | Status: DC

## 2017-07-10 MED ORDER — SODIUM CHLORIDE 0.9% FLUSH
3.0000 mL | Freq: Two times a day (BID) | INTRAVENOUS | Status: DC
  Administered 2017-07-11 – 2017-07-12 (×3): 3 mL via INTRAVENOUS

## 2017-07-10 SURGICAL SUPPLY — 18 items
BALLN LINEAR 7.5FR IABP 40CC (BALLOONS) ×2
BALLOON LINEAR 7.5FR IABP 40CC (BALLOONS) IMPLANT
CATH INFINITI 5FR MULTPACK ANG (CATHETERS) ×1 IMPLANT
COVER PRB 48X5XTLSCP FOLD TPE (BAG) IMPLANT
COVER PROBE 5X48 (BAG) ×2
DEVICE SECURE STATLOCK IABP (MISCELLANEOUS) ×2 IMPLANT
GLIDESHEATH SLEND SS 6F .021 (SHEATH) IMPLANT
GUIDEWIRE INQWIRE 1.5J.035X260 (WIRE) IMPLANT
INQWIRE 1.5J .035X260CM (WIRE)
KIT ENCORE 26 ADVANTAGE (KITS) ×1 IMPLANT
KIT HEART LEFT (KITS) ×2 IMPLANT
PACK CARDIAC CATHETERIZATION (CUSTOM PROCEDURE TRAY) ×2 IMPLANT
SHEATH PINNACLE 6F 10CM (SHEATH) ×1 IMPLANT
TRANSDUCER W/STOPCOCK (MISCELLANEOUS) ×2 IMPLANT
TUBING CIL FLEX 10 FLL-RA (TUBING) ×2 IMPLANT
VALVE GUARDIAN II ~~LOC~~ HEMO (MISCELLANEOUS) ×1 IMPLANT
WIRE EMERALD 3MM-J .035X150CM (WIRE) ×1 IMPLANT
WIRE HI TORQ VERSACORE-J 145CM (WIRE) ×1 IMPLANT

## 2017-07-10 NOTE — ED Notes (Signed)
Patient transported to X-ray 

## 2017-07-10 NOTE — ED Notes (Signed)
Patient transported to nuclear medicine 

## 2017-07-10 NOTE — Progress Notes (Signed)
Code stemi page. Called ER and asked if family was present. Was told they were not and that I would be paged if needed.

## 2017-07-10 NOTE — ED Notes (Signed)
EDP aware of bp.  

## 2017-07-10 NOTE — ED Provider Notes (Signed)
[facility]-EMERGENCY DEPT Provider Note   CSN: [id number] Arrival date & time: 07/10/17  1246     History   Chief Complaint Chief Complaint  Patient presents with  . Shortness of Breath    HPI [name] is a 83 y.o. female.  Patient with history of warfarin use, previous MI, abnormal EKG, abdominal aortic aneurysm status post aortobifemoral bypass -- presents with acute onset of lightheadedness described as "I was going to pass out". Patient has the feeling like she cannot catch her breath. She also has some mild chest tightness she thought was gas. No diaphoresis or vomiting. No reported exertional symptoms. Symptoms began when patient was getting out of her bathtub. She had to sit down. She called a family member who came over to her house. She also called EMS. When family member arrived, fire department was on scene and family reports that she looked pale. Patient was given aspirin and one nitroglycerin. She was transported to the hospital. She continues to have some pain in her chest and shortness of breath. Prior to this episode, she felt well. No fevers, cough. She has had similar symptoms in the past but never this severe. No lower extremity swelling, recent surgeries, recent immobilizations. She has been compliant with her warfarin and states that her INR has recently been high.      Past Medical History:  Diagnosis Date  . Aortic aneurysm (HCC)   . 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 Cardiac aneurysm    ventrral - bypass   . Carotid artery occlusion   . Carotid bruit    bil carotid bruits   . Cataract   . GERD (gastroesophageal reflux disease)   . Gout   . H/O blood clots   . History of aorto-femoral bypass   . Hyperlipidemia   . Hypertension   . Lower back pain    disc   . Migraines   . Myocardial infarction (HCC)   . Osteoporosis   . Pancreatitis   . Peptic ulcer    Hx   . Stricture esophagus    Hx  . Stroke [facility](HCC)    Right eye  stroke  . Urethral stenosis     Patient Active Problem List   Diagnosis Date Noted  . CKD (chronic kidney disease) stage 4, GFR 15-29 ml/min (HCC) 06/21/2017  . Metabolic syndrome 08/25/2015  . Esophageal obstruction due to food impaction 07/12/2015  . Peripheral vascular disease (HCC) 07/12/2015  . Old anterior myocardial infarction   . History of stroke   . Peripheral neuropathy   . AAA (abdominal aortic aneurysm) without rupture (HCC) 03/24/2013  . Chronic anticoagulation 08/07/2011  . Cerebrovascular disease 08/07/2011  . PVD (peripheral vascular disease) (HCC) 08/07/2011  . Hyperlipidemia 08/07/2011  . Ventricular aneurysm 08/07/2011  . GERD with stricture 08/07/2011  . COPD (chronic obstructive pulmonary disease) (HCC) 08/07/2011  . CAD (coronary artery disease)   . Hypertensive heart disease     Past Surgical History:  Procedure Laterality Date  . ABDOMINAL HYSTERECTOMY    . AORTA - BILATERAL FEMORAL ARTERY BYPASS GRAFT  7/99   Dr. Madilyn FiremanHayes   . aortic & cardiac aneurysm rupture  04/1998  . APPENDECTOMY    . ARCH AORTOGRAM N/A 11/17/2012   Procedure: ARCH AORTOGRAM;  Surgeon: Chuck Hinthristopher S Dickson, MD;  [facility];  Service: Cardiovascular;  Laterality: N/A;  . CAROTID ENDARTERECTOMY  July 2003   Left  . CAROTID ENDARTERECTOMY  11/18/2007  Left  . CORONARY ARTERY BYPASS GRAFT     16 yrs ago  . ESOPHAGEAL DILATION  1996  . ESOPHAGOGASTRODUODENOSCOPY (EGD) WITH PROPOFOL N/A 07/12/2015   Procedure: ESOPHAGOGASTRODUODENOSCOPY (EGD) WITH PROPOFOL with possible dilatation;  Surgeon: Bernette Redbird, MD;  Location: [facility] ENDOSCOPY;  Service: Endoscopy;  Laterality: N/A;  Consideration of endotracheal intubation for airway protection  . EYE SURGERY    . LUMBAR DISC SURGERY  1993  . PARTIAL HYSTERECTOMY  1966   single oopherectomy (right)   . STOMACH SURGERY     ruptured ulcer    OB History    No data available       Home Medications    Prior to Admission  medications   Medication Sig Start Date End Date Taking? Authorizing Provider  amLODipine (NORVASC) 5 MG tablet Take 1 tablet (5 mg total) by mouth daily. 02/08/17   Daphine Deutscher, Mary-Earlisha, FNP  atorvastatin (LIPITOR) 40 MG tablet Take 1 tablet (40 mg total) by mouth daily. 02/08/17   Daphine Deutscher Mary-Rudene, FNP  HYDROcodone-acetaminophen (NORCO/VICODIN) 5-325 MG tablet Take 1 tablet by mouth every 6 (six) hours as needed. For pain 05/07/17   Bennie Pierini, FNP  metoprolol (LOPRESSOR) 50 MG tablet Take 1 tablet (50 mg total) by mouth 2 (two) times daily. 02/08/17   Daphine Deutscher, Mary-Freeda, FNP  pantoprazole (PROTONIX) 40 MG tablet Take 1 tablet (40 mg total) by mouth daily. 02/08/17   Daphine Deutscher, Mary-Sabrin, FNP  predniSONE (DELTASONE) 20 MG tablet 2 po at same time daily for 5 days 07/05/17   Dettinger, Elige Radon, MD  ranitidine (ZANTAC) 150 MG tablet Take 1 tablet (150 mg total) by mouth 2 (two) times daily. 05/07/17   Daphine Deutscher, Mary-Miyanna, FNP  spironolactone (ALDACTONE) 50 MG tablet Take 1 tablet (50 mg total) by mouth daily. 07/08/17   Dettinger, Elige Radon, MD  warfarin (COUMADIN) 1 MG tablet Take 1-2 tablets (1-2 mg total) by mouth daily. 05/28/17   Henrene Pastor, PharmD    Family History Family History  Problem Relation Age of Onset  . CAD Father        died age 19+  . Heart disease Father        Older than 60  . Heart attack Father   . Osteoarthritis Sister        paraplegic due to disk rupture  . CAD Sister   . Arthritis Sister   . Varicose Veins Mother   . Colon cancer Neg Hx     Social History Social History  Substance Use Topics  . Smoking status: Former Smoker    Quit date: 04/06/1998  . Smokeless tobacco: Never Used  . Alcohol use No     Allergies   Alendronate sodium; Buffered aspirin; Ibuprofen; Prednisone; and Vioxx [rofecoxib]   Review of Systems Review of Systems  Constitutional: Negative for diaphoresis and fever.  Eyes: Negative for redness.  Respiratory:  Positive for shortness of breath. Negative for cough.   Cardiovascular: Positive for chest pain. Negative for palpitations and leg swelling.  Gastrointestinal: Negative for abdominal pain, nausea and vomiting.  Genitourinary: Negative for dysuria.  Musculoskeletal: Negative for back pain and neck pain.  Skin: Negative for rash.  Neurological: Positive for light-headedness and headaches. Negative for syncope.  Psychiatric/Behavioral: The patient is not nervous/anxious.      Physical Exam Updated Vital Signs BP 103/62   Pulse 71   Temp 98.5 F (36.9 C) (Oral)   Resp (!) 21   Ht 5\' 5"  (1.651 m)   Wt  68 kg (150 lb)   SpO2 99%   BMI 24.96 kg/m   Physical Exam  Constitutional: She appears well-developed and well-nourished.  HENT:  Head: Normocephalic and atraumatic.  Mouth/Throat: Oropharynx is clear and moist and mucous membranes are normal. Mucous membranes are not dry.  Eyes: Conjunctivae are normal.  Neck: Trachea normal and normal range of motion. Neck supple. Normal carotid pulses and no JVD present. No muscular tenderness present. Carotid bruit is not present. No tracheal deviation present.  Cardiovascular: Normal rate, regular rhythm, S1 normal, S2 normal, normal heart sounds and intact distal pulses.  Exam reveals no decreased pulses.   No murmur heard. Pulmonary/Chest: Effort normal. Tachypnea noted. No respiratory distress. She has no wheezes. She exhibits no tenderness.  Abdominal: Soft. Normal aorta and bowel sounds are normal. There is no tenderness. There is no rebound and no guarding.  Musculoskeletal: Normal range of motion.  Neurological: She is alert.  Skin: Skin is warm and dry. She is not diaphoretic. No cyanosis. No pallor.  Psychiatric: She has a normal mood and affect.  Nursing note and vitals reviewed.    ED Treatments / Results  Labs (all labs ordered are listed, but only abnormal results are displayed) Labs Reviewed  CBC WITH DIFFERENTIAL/PLATELET -  Abnormal; Notable for the following:       Result Value   WBC 12.7 (*)    RBC 3.62 (*)    Hemoglobin 11.2 (*)    HCT 33.9 (*)    Neutro Abs 11.7 (*)    Lymphs Abs 0.5 (*)    All other components within normal limits  BASIC METABOLIC PANEL - Abnormal; Notable for the following:    Sodium 133 (*)    CO2 21 (*)    Glucose, Bld 127 (*)    BUN 25 (*)    Creatinine, Ser 1.58 (*)    Calcium 8.2 (*)    GFR calc non Af Amer 29 (*)    GFR calc Af Amer 34 (*)    All other components within normal limits  D-DIMER, QUANTITATIVE (NOT AT [facility]) - Abnormal; Notable for the following:    D-Dimer, Quant 3.34 (*)    All other components within normal limits  I-STAT TROPONIN, ED - Abnormal; Notable for the following:    Troponin i, poc 0.69 (*)    All other components within normal limits  PROTIME-INR    EKG  EKG Interpretation  Date/Time:  Wednesday July 10 2017 12:51:00 EDT Ventricular Rate:  74 PR Interval:    QRS Duration: 92 QT Interval:  396 QTC Calculation: 440 R Axis:   69 Text Interpretation:  Sinus rhythm Nonspecific T wave abnormality No significant change since last tracing Confirmed by Cathren Laine ([id number]) on 07/10/2017 1:42:35 PM       Radiology Dg Chest 2 View  Result Date: 07/10/2017 CLINICAL DATA:  Chest pain EXAM: CHEST  2 VIEW COMPARISON:  03/09/2014 FINDINGS: The heart is moderately enlarged. Left hemidiaphragm remains elevated and there is subsegmental atelectasis at the left base. Normal pulmonary vascularity. Post operative changes. No pneumothorax. IMPRESSION: Cardiomegaly without edema. Left basilar atelectasis. Electronically Signed   By: Jolaine Click M.D.   On: 07/10/2017 15:10   Ct Head Wo Contrast  Result Date: 07/10/2017 CLINICAL DATA:  Severe headache EXAM: CT HEAD WITHOUT CONTRAST TECHNIQUE: Contiguous axial images were obtained from the base of the skull through the vertex without intravenous contrast. COMPARISON:  03/10/2010 FINDINGS: Brain: Diffuse  cerebral atrophy. No acute intracranial  abnormality. Specifically, no hemorrhage, hydrocephalus, mass lesion, acute infarction, or significant intracranial injury. Vascular: No hyperdense vessel or unexpected calcification. Skull: No acute calvarial abnormality. Sinuses/Orbits: Visualized paranasal sinuses and mastoids clear. Orbital soft tissues unremarkable. Other: None IMPRESSION: No acute intracranial abnormality. Electronically Signed   By: Charlett NoseKevin  Dover M.D.   On: 07/10/2017 15:16    Procedures Procedures (including critical care time)  Medications Ordered in ED Medications  sodium chloride 0.9 % bolus 500 mL (500 mLs Intravenous New Bag/Given 07/10/17 1520)  0.9 %  sodium chloride infusion (not administered)  morphine 4 MG/ML injection 2 mg (2 mg Intravenous Given 07/10/17 1434)  ondansetron (ZOFRAN) injection 4 mg (4 mg Intravenous Given 07/10/17 1435)     Initial Impression / Assessment and Plan / ED Course  I have reviewed the triage vital signs and the nursing notes.  Pertinent labs & imaging results that were available during my care of the patient were reviewed by me and considered in my medical decision making (see chart for details).     Patient seen and examined. Work-up initiated. Medications ordered. I took her off NRB and she did not desat. She did feel more SOB though so I placed her on [place] at 3L.   EKG reviewed, abnormal but unchanged.   Vital signs reviewed and are as follows: BP 103/62   Pulse 71   Temp 98.5 F (36.9 C) (Oral)   Resp (!) 21   Ht 5\' 5"  (1.651 m)   Wt 68 kg (150 lb)   SpO2 99%   BMI 24.96 kg/m   Trop elevated. Seen by Dr. Denton LankSteinl who ordered CT head and morphine.   I spoke with Dr. Donnie Ahoilley who is aware of patient and will see. Patient is unable to have a CT angiogram is of her kidney function.  CT of the head does not show any bleeding. Patient will be started on heparin. She will need inpatient VQ scan.  Spoke with [facility] hospitalist who will  admit. Patient and family updated. She appears more comfortable and feels better at current time.  CRITICAL CARE Performed by: Carolee RotaGEIPLE,Jodie Cavey S Total critical care time: 40 minutes Critical care time was exclusive of separately billable procedures and treating other patients. Critical care was necessary to treat or prevent imminent or life-threatening deterioration. Critical care was time spent personally by me on the following activities: development of treatment plan with patient and/or surrogate as well as nursing, discussions with consultants, evaluation of patient's response to treatment, examination of patient, obtaining history from patient or surrogate, ordering and performing treatments and interventions, ordering and review of laboratory studies, ordering and review of radiographic studies, pulse oximetry and re-evaluation of patient's condition.   Final Clinical Impressions(s) / ED Diagnoses   Final diagnoses:  Chest pain, unspecified type  Elevated troponin  Shortness of breath   Admit.   New Prescriptions New Prescriptions   No medications on file      Renne CriglerGeiple, Vincent Streater, Cordelia Poche-C 07/10/17 1559    Cathren LaineSteinl, Kevin, MD 07/11/17 1210

## 2017-07-10 NOTE — ED Notes (Signed)
Noted rhythm change on cardiac monitor, EKG performed, given to Dr. Verdie MosherLiu, EDP. Dr. Verdie MosherLiu to page cardiology.

## 2017-07-10 NOTE — H&P (Signed)
History and Physical    Maria ClementMargaret [name] VHQ:[id number] DOB: [birth year] DOA: 07/10/2017   PCP: Bennie PieriniMartin, Mary-Honesti, FNP   Patient coming from:  Home    Chief Complaint: Shortness of breath, presyncope   HPI: Maria ClementMargaret [name] is a 83 y.o. female with extensive medical history listed below, including CAD status post am I in the past, history of aortic abdominal aneurysm, status post repair, prior history of clots postoperatively, on chronic Coumadin, presenting with a 2nd episode of lightheadedness described as " I was going to pass out", and a feeling of inability to catch her breath. She also reported mild chest tightness. She denies any diaphoresis or vomiting. She denies any exertional symptoms. Today, the symptoms begun when getting out of the bathtub, had to sit down. She denies loss consciousness. No confusion was reported The prior event happen on Sunday, but that was of shorter duration. When EMS arrived, the patient was pale. She was given aspirin and nitroglycerin without significant improvement of her symptoms. She denies any fever sore cough. She denies any recent surgeries, immobilizations, or lower extremity swelling. The patient had been compliant with her warfarin. She denies any abdominal pain. No dysuria or gross hematuria she denies any sick contacts. She admits to not taking enough liquid intake. She is not on hormonal products.   ED Course:  BP 112/70   Pulse 69   Temp 98.5 F (36.9 C) (Oral)   Resp 16   Ht 5\' 5"  (1.651 m)   Wt 68 kg (150 lb)   SpO2 99%   BMI 24.96 kg/m    EKG SR no significant change since prior, QTC 440  troponin 0.69 sodium 133 potassium 4.5 glucose 127  creatinine 1.58 GFR 29  white count 12.7 hemoglobin 11.2 platelets 270  D dimer 3.34  PT 12.9 INR 0.98 CT of the head is negative for acute intracranial abnormality is chest x-ray with cardiomegaly without significant edema, left basilar atelectasis.   Review of Systems:  As per HPI otherwise all  other systems reviewed and are negative  Past Medical History:  Diagnosis Date  . Aortic aneurysm (HCC)   . 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 Cardiac aneurysm    ventrral - bypass   . Carotid artery occlusion   . Carotid bruit    bil carotid bruits   . Cataract   . GERD (gastroesophageal reflux disease)   . Gout   . H/O blood clots   . History of aorto-femoral bypass   . Hyperlipidemia   . Hypertension   . Lower back pain    disc   . Migraines   . Myocardial infarction (HCC)   . Osteoporosis   . Pancreatitis   . Peptic ulcer    Hx   . Stricture esophagus    Hx  . Stroke [facility](HCC)    Right eye stroke  . Urethral stenosis     Past Surgical History:  Procedure Laterality Date  . ABDOMINAL HYSTERECTOMY    . AORTA - BILATERAL FEMORAL ARTERY BYPASS GRAFT  7/99   Dr. Madilyn FiremanHayes   . aortic & cardiac aneurysm rupture  04/1998  . APPENDECTOMY    . ARCH AORTOGRAM N/A 11/17/2012   Procedure: ARCH AORTOGRAM;  Surgeon: Chuck Hinthristopher S Dickson, MD;  [facility];  Service: Cardiovascular;  Laterality: N/A;  . CAROTID ENDARTERECTOMY  July 2003   Left  . CAROTID ENDARTERECTOMY  11/18/2007   Left  . CORONARY ARTERY BYPASS  GRAFT     16 yrs ago  . ESOPHAGEAL DILATION  1996  . ESOPHAGOGASTRODUODENOSCOPY (EGD) WITH PROPOFOL N/A 07/12/2015   Procedure: ESOPHAGOGASTRODUODENOSCOPY (EGD) WITH PROPOFOL with possible dilatation;  Surgeon: Bernette Redbird, MD;  Location: [facility] ENDOSCOPY;  Service: Endoscopy;  Laterality: N/A;  Consideration of endotracheal intubation for airway protection  . EYE SURGERY    . LUMBAR DISC SURGERY  1993  . PARTIAL HYSTERECTOMY  1966   single oopherectomy (right)   . STOMACH SURGERY     ruptured ulcer    Social History Social History   Social History  . Marital status: Widowed    Spouse name: N/A  . Number of children: N/A  . Years of education: N/A   Occupational History  . 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 Smoking status: Former Smoker    Quit date: 04/06/1998  . Smokeless tobacco: Never Used  . Alcohol use No  . Drug use: No  . Sexual activity: No   Other Topics Concern  . Not on file   Social History Narrative  . No narrative on file     Allergies  Allergen Reactions  . Alendronate Sodium Other (See Comments)    Irritates esophagus and ulcers  . Buffered Aspirin Other (See Comments)    On blood thinners but doctor has started aspirin as stroke therapy  . Ibuprofen Other (See Comments)    Blood thinner therapy  . Prednisone Other (See Comments)    Patient states due to osteoporosis Ok if not long term    Family History  Problem Relation Age of Onset  . CAD Father        died age 39+  . Heart disease Father        Older than 60  . Heart attack Father   . Osteoarthritis Sister        paraplegic due to disk rupture  . CAD Sister   . Arthritis Sister   . Varicose Veins Mother   . Colon cancer Neg Hx       Prior to Admission medications   Medication Sig Start Date End Date Taking? Authorizing Provider  amLODipine (NORVASC) 5 MG tablet Take 1 tablet (5 mg total) by mouth daily. 02/08/17   Daphine Deutscher, Mary-Sherman, FNP  atorvastatin (LIPITOR) 40 MG tablet Take 1 tablet (40 mg total) by mouth daily. 02/08/17   Daphine Deutscher Mary-Shajuan, FNP  HYDROcodone-acetaminophen (NORCO/VICODIN) 5-325 MG tablet Take 1 tablet by mouth every 6 (six) hours as needed. For pain 05/07/17   Bennie Pierini, FNP  metoprolol (LOPRESSOR) 50 MG tablet Take 1 tablet (50 mg total) by mouth 2 (two) times daily. 02/08/17   Daphine Deutscher, Mary-Trayce, FNP  pantoprazole (PROTONIX) 40 MG tablet Take 1 tablet (40 mg total) by mouth daily. 02/08/17   Daphine Deutscher, Mary-Innocence, FNP  predniSONE (DELTASONE) 20 MG tablet 2 po at same time daily for 5 days 07/05/17   Dettinger, Elige Radon, MD  ranitidine (ZANTAC) 150 MG tablet Take 1 tablet (150 mg total) by mouth 2 (two) times daily. 05/07/17    Daphine Deutscher, Mary-Ryenne, FNP  spironolactone (ALDACTONE) 50 MG tablet Take 1 tablet (50 mg total) by mouth daily. 07/08/17   Dettinger, Elige Radon, MD  warfarin (COUMADIN) 1 MG tablet Take 1-2 tablets (1-2 mg total) by mouth daily. 05/28/17   Henrene Pastor, PharmD    Physical Exam:  Vitals:   07/10/17 1500 07/10/17 1515 07/10/17 1539 07/10/17 1545  BP: (!) 91/55 (!) 90/48 (!) 93/59 112/70  Pulse: 69 69 67 69  Resp: 17 18 11 16   Temp:      TempSrc:      SpO2: 95% 97% 98% 99%  Weight:      Height:       Constitutional: NAD, calm, comfortable, Frail appearing Eyes: PERRL, lids and conjunctivae normal ENMT: Mucous membranes are moist, without exudate or lesions  Neck: normal, supple, no masses, no thyromegaly Respiratory: decrease press sounds on the left, with some atelectatic sounds no wheezing, no crackles. Normal respiratory effort  Cardiovascular: Regular rate and rhythm 2/6  murmurs, rubs or gallops. No extremity edema. 2+ pedal pulses. No carotid bruits.  Abdomen: Soft, non tender, No hepatosplenomegaly. Bowel sounds positive.  Musculoskeletal: no clubbing / cyanosis. Moves all extremities Skin: no jaundice, chronic diffuse non painful rash, dry, recentley finished a course of prednisone  Neurologic: Sensation intact  Strength equal in all extremities Psychiatric:   Alert and oriented x 3. Normal mood.     Labs on Admission: I have personally reviewed following labs and imaging studies  CBC:  Recent Labs Lab 07/10/17 1316  WBC 12.7*  NEUTROABS 11.7*  HGB 11.2*  HCT 33.9*  MCV 93.6  PLT 271    Basic Metabolic Panel:  Recent Labs Lab 07/10/17 1316  NA 133*  K 4.5  CL 104  CO2 21*  GLUCOSE 127*  BUN 25*  CREATININE 1.58*  CALCIUM 8.2*    GFR: Estimated Creatinine Clearance: 24.3 mL/min (A) (by C-G formula based on SCr of 1.58 mg/dL (H)).  Liver Function Tests: No results for input(s): AST, ALT, ALKPHOS, BILITOT, PROT, ALBUMIN in the last 168 hours. No  results for input(s): LIPASE, AMYLASE in the last 168 hours. No results for input(s): AMMONIA in the last 168 hours.  Coagulation Profile:  Recent Labs Lab 07/05/17 1043 07/10/17 1316  INR 1.1 0.98    Cardiac Enzymes: No results for input(s): CKTOTAL, CKMB, CKMBINDEX, TROPONINI in the last 168 hours.  BNP (last 3 results) No results for input(s): PROBNP in the last 8760 hours.  HbA1C: No results for input(s): HGBA1C in the last 72 hours.  CBG: No results for input(s): GLUCAP in the last 168 hours.  Lipid Profile: No results for input(s): CHOL, HDL, LDLCALC, TRIG, CHOLHDL, LDLDIRECT in the last 72 hours.  Thyroid Function Tests: No results for input(s): TSH, T4TOTAL, FREET4, T3FREE, THYROIDAB in the last 72 hours.  Anemia Panel: No results for input(s): VITAMINB12, FOLATE, FERRITIN, TIBC, IRON, RETICCTPCT in the last 72 hours.  Urine analysis:    Component Value Date/Time   COLORURINE YELLOW 10/27/2012 1754   APPEARANCEUR CLEAR 10/27/2012 1754   LABSPEC 1.020 10/27/2012 1754   PHURINE 5.5 10/27/2012 1754   GLUCOSEU NEGATIVE 10/27/2012 1754   HGBUR SMALL (A) 10/27/2012 1754   BILIRUBINUR NEGATIVE 10/27/2012 1754   KETONESUR NEGATIVE 10/27/2012 1754   PROTEINUR NEGATIVE 10/27/2012 1754   UROBILINOGEN 0.2 10/27/2012 1754   NITRITE NEGATIVE 10/27/2012 1754   LEUKOCYTESUR NEGATIVE 10/27/2012 1754    Sepsis Labs: @LABRCNTIP (procalcitonin:4,lacticidven:4) )No results found for this or any previous visit (from the past 240 hour(s)).   Radiological Exams on Admission: Dg Chest 2 View  Result Date: 07/10/2017 CLINICAL DATA:  Chest pain EXAM: CHEST  2 VIEW COMPARISON:  03/09/2014 FINDINGS: The heart is moderately enlarged. Left hemidiaphragm remains elevated and there is subsegmental atelectasis at the left base. Normal pulmonary vascularity. Post operative changes. No pneumothorax. IMPRESSION: Cardiomegaly without edema. Left  basilar atelectasis. Electronically Signed    By: Jolaine ClickArthur  Hoss M.D.   On: 07/10/2017 15:10   Ct Head Wo Contrast  Result Date: 07/10/2017 CLINICAL DATA:  Severe headache EXAM: CT HEAD WITHOUT CONTRAST TECHNIQUE: Contiguous axial images were obtained from the base of the skull through the vertex without intravenous contrast. COMPARISON:  03/10/2010 FINDINGS: Brain: Diffuse cerebral atrophy. No acute intracranial abnormality. Specifically, no hemorrhage, hydrocephalus, mass lesion, acute infarction, or significant intracranial injury. Vascular: No hyperdense vessel or unexpected calcification. Skull: No acute calvarial abnormality. Sinuses/Orbits: Visualized paranasal sinuses and mastoids clear. Orbital soft tissues unremarkable. Other: None IMPRESSION: No acute intracranial abnormality. Electronically Signed   By: Charlett NoseKevin  Dover M.D.   On: 07/10/2017 15:16    EKG: Independently reviewed.  Assessment/Plan Active Problems:   Pre-syncope   Chronic anticoagulation   Cerebrovascular disease   CAD (coronary artery disease)   PVD (peripheral vascular disease) (HCC)   Hypertensive heart disease   Hyperlipidemia   Ventricular aneurysm   GERD with stricture   COPD (chronic obstructive pulmonary disease) (HCC)   History of stroke   Peripheral neuropathy   CKD (chronic kidney disease) stage 4, GFR 15-29 ml/min (HCC)    Pre Syncope, cardiac versus infectious versus dehydration Prior history of PE, on chronic Coumadin, to the other therapeutic at 0.98. EKG unrevealing. TN 0.69 DDmer 3.5  Neuro exam normal . The patient's blood pressure as 93/59, unclear if this is related to any infection versus dehydration versus cardiopulmonary event. She had  received morphine a, blood pressure medications as well as nitroglycerin in on transport. White count is 12 . Afebrile  Syncope order set  admit for observation - Tele bed. VQ scan  2 D echo  IV fluids at 100 cc/h  EKG  Blood CX Urinalysis   Pharmacy to manage Heparin for now, to return to Coumadin in am   Hold Beta blockers and other BP meds Cardiology consult pending   Elevated Troponin, currently CP free, ? Related to PE. History of CVA, clot  As above EKG  SR without significant changes from prior Currently her TN  0. 69  .[name]. The patient was given NTG and ASA on transport without significant relief, but currently, she is chest pain free . Dr. Donnie Ahoilley, Cardiology to see patient later today     Hypertension BP 112/70   Pulse 69  Continue home anti-hypertensive medications in am, currently on hold due to presyncope    Hyperlipidemia Continue home statins  GERD, no acute symptoms Continue PPI  Chronic kidney disease stage 4   baseline creatinine  1.6, current 1.58   Lab Results  Component Value Date   CREATININE 1.58 (H) 07/10/2017   CREATININE 1.74 (H) 06/17/2017   CREATININE 1.61 (H) 02/08/2017  IVF Hold diuretics today  Repeat CMET in am   DVT prophylaxis: on Heparin to transition to Coumadin in am  Code Status:     DNR Family Communication:  Discussed with patient Disposition Plan: Expect patient to be discharged to home after condition improves Consults called:  Cards, Dr. Donnie AhoIlley as per EDP    Admission status:Tele Obs    Marlowe KaysWERTMAN,Jurell Basista E, PA-C [facility]   07/10/2017, 4:34 PM

## 2017-07-10 NOTE — Progress Notes (Addendum)
ANTICOAGULATION CONSULT NOTE - Initial Consult  Pharmacy Consult for Heparin Indication: chest pain/ACS  Allergies  Allergen Reactions  . Alendronate Sodium Other (See Comments)    Irritates esophagus and ulcers  . Buffered Aspirin Other (See Comments)    On blood thinners but doctor has started aspirin as stroke therapy  . Ibuprofen Other (See Comments)    Blood thinner therapy  . Prednisone Other (See Comments)    Patient states due to osteoporosis Ok if not long term  . Vioxx [Rofecoxib] Other (See Comments)    Unknown by patient    Patient Measurements: Height: 5\' 5"  (165.1 cm) Weight: 150 lb (68 kg) IBW/kg (Calculated) : 57  Vital Signs: Temp: 98.5 F (36.9 C) (08/01 1253) Temp Source: Oral (08/01 1253) BP: 112/70 (08/01 1545) Pulse Rate: 69 (08/01 1545)  Labs:  Recent Labs  07/10/17 1316  HGB 11.2*  HCT 33.9*  PLT 271  LABPROT 12.9  INR 0.98  CREATININE 1.58*    Estimated Creatinine Clearance: 24.3 mL/min (A) (by C-G formula based on SCr of 1.58 mg/dL (H)).   Medical History: Past Medical History:  Diagnosis Date  . Aortic aneurysm (HCC)   . 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 Cardiac aneurysm    ventrral - bypass   . Carotid artery occlusion   . Carotid bruit    bil carotid bruits   . Cataract   . GERD (gastroesophageal reflux disease)   . Gout   . H/O blood clots   . History of aorto-femoral bypass   . Hyperlipidemia   . Hypertension   . Lower back pain    disc   . Migraines   . Myocardial infarction (HCC)   . Osteoporosis   . Pancreatitis   . Peptic ulcer    Hx   . Stricture esophagus    Hx  . Stroke [facility](HCC)    Right eye stroke  . Urethral stenosis     Medications:   (Not in a hospital admission)  Assessment: 85 YOF on coumadin at home here with lightheadedness, shortness of breath and mild chest tightness. Pharmacy consulted to start IV heparin for ACS. Initial troponin 0.69.  INR on  admission is 0.98. Unsure if patient is compliant with home Coumadin therapy.   Goal of Therapy:  Heparin level 0.3-0.7 units/ml Monitor platelets by anticoagulation protocol: Yes   Plan:  -Give heparin 4000 IV bolus, then heparin 800 units/hr  -F/u 8 hour HL -Monitor daily HL, CBC and s/s of bleeding.   Vinnie LevelBenjamin Gabbie Marzo, PharmD., BCPS Clinical Pharmacist Pager [phone]  Addendum: Resuming coumadin while ruling out PE. Patient is prescribed Coumadin 2 mg daily. Will start patient on coumadin 3 mg daily and order daily INR.   Vinnie LevelBenjamin Aquil Duhe, PharmD., BCPS Clinical Pharmacist Pager [phone]

## 2017-07-10 NOTE — ED Notes (Signed)
Patient return from nuclear medicine

## 2017-07-10 NOTE — ED Notes (Signed)
[facility]. Personal belongings given to family at bedside.

## 2017-07-10 NOTE — ED Triage Notes (Signed)
Patient coming from home via [facility] complaining of shortness of breath. O2 sat 98% on room air. Nonrebreather applied for comfort Patient complaining of central chest pain. Patient received 324 aspirin and 1 nitro in route. Patient alert and oriented x3. unoriented to time.

## 2017-07-10 NOTE — ED Provider Notes (Signed)
At 8:35PM I was shown repeat EKG. Concerning for evolving EKG changes showing ST elevation in aVR and V1 with diffuse ischemia. Spoke with Cardiology fellow and CODE STEMI activated. She already received full ASA earlier today and currently on heparin drip. Spoke with Dr. Eldridge DaceVaranasi. Patient to be taken to the cath lab.   CRITICAL CARE Performed by: Lavera Guiseana Duo Tuwana Kapaun   Total critical care time: 31 minutes  Critical care time was exclusive of separately billable procedures and treating other patients.  Critical care was necessary to treat or prevent imminent or life-threatening deterioration.  Critical care was time spent personally by me on the following activities: development of treatment plan with patient and/or surrogate as well as nursing, discussions with consultants, evaluation of patient's response to treatment, examination of patient, obtaining history from patient or surrogate, ordering and performing treatments and interventions, ordering and review of laboratory studies, ordering and review of radiographic studies, pulse oximetry and re-evaluation of patient's condition.    Lavera GuiseLiu, Duane Earnshaw Duo, MD 07/10/17 [phone]

## 2017-07-11 ENCOUNTER — Inpatient Hospital Stay (HOSPITAL_COMMUNITY): Payer: Medicare Other

## 2017-07-11 ENCOUNTER — Encounter (HOSPITAL_COMMUNITY): Payer: Self-pay | Admitting: Interventional Cardiology

## 2017-07-11 DIAGNOSIS — R570 Cardiogenic shock: Secondary | ICD-10-CM

## 2017-07-11 DIAGNOSIS — I253 Aneurysm of heart: Secondary | ICD-10-CM

## 2017-07-11 DIAGNOSIS — N184 Chronic kidney disease, stage 4 (severe): Secondary | ICD-10-CM

## 2017-07-11 DIAGNOSIS — R748 Abnormal levels of other serum enzymes: Secondary | ICD-10-CM

## 2017-07-11 DIAGNOSIS — E785 Hyperlipidemia, unspecified: Secondary | ICD-10-CM

## 2017-07-11 DIAGNOSIS — I739 Peripheral vascular disease, unspecified: Secondary | ICD-10-CM

## 2017-07-11 DIAGNOSIS — I679 Cerebrovascular disease, unspecified: Secondary | ICD-10-CM

## 2017-07-11 DIAGNOSIS — R079 Chest pain, unspecified: Secondary | ICD-10-CM

## 2017-07-11 DIAGNOSIS — I2510 Atherosclerotic heart disease of native coronary artery without angina pectoris: Secondary | ICD-10-CM

## 2017-07-11 DIAGNOSIS — R0602 Shortness of breath: Secondary | ICD-10-CM

## 2017-07-11 DIAGNOSIS — I213 ST elevation (STEMI) myocardial infarction of unspecified site: Secondary | ICD-10-CM

## 2017-07-11 DIAGNOSIS — I25110 Atherosclerotic heart disease of native coronary artery with unstable angina pectoris: Secondary | ICD-10-CM

## 2017-07-11 DIAGNOSIS — Z7901 Long term (current) use of anticoagulants: Secondary | ICD-10-CM

## 2017-07-11 LAB — ECHOCARDIOGRAM COMPLETE
AOVTI: 62.1 cm
AV Area mean vel: 1.29 cm2
AV VEL mean LVOT/AV: 0.46
AV area mean vel ind: 0.74 cm2/m2
AV peak Index: 0.7
AVA: 1.5 cm2
AVAREAVTI: 1.23 cm2
AVAREAVTIIND: 0.86 cm2/m2
AVG: 20 mmHg
AVLVOTPG: 8 mmHg
AVPG: 41 mmHg
AVPKVEL: 322 cm/s
Ao pk vel: 0.43 m/s
CHL CUP AV VEL: 1.5
CHL CUP MV DEC (S): 187
DOP CAL AO MEAN VELOCITY: 195 cm/s
EERAT: 19.18
EWDT: 187 ms
FS: 27 % — AB (ref 28–44)
Height: 65 in
IV/PV OW: 0.94
LA ID, A-P, ES: 32 mm
LA diam index: 1.83 cm/m2
LAVOL: 50.6 mL
LAVOLA4C: 53.2 mL
LAVOLIN: 28.9 mL/m2
LEFT ATRIUM END SYS DIAM: 32 mm
LV PW d: 15.1 mm — AB (ref 0.6–1.1)
LV TDI E'LATERAL: 3.92
LVEEAVG: 19.18
LVEEMED: 19.18
LVELAT: 3.92 cm/s
LVOT VTI: 32.9 cm
LVOT area: 2.84 cm2
LVOT peak VTI: 0.53 cm
LVOT peak vel: 139 cm/s
LVOTD: 19 mm
LVOTSV: 93 mL
MV Peak grad: 2 mmHg
MV pk A vel: 91.2 m/s
MV pk E vel: 75.2 m/s
RV LATERAL S' VELOCITY: 12.4 cm/s
TAPSE: 13.4 mm
TDI e' medial: 4.13
Valve area index: 0.86
Weight: 2328.06 oz

## 2017-07-11 LAB — CBC
HEMATOCRIT: 31.6 % — AB (ref 36.0–46.0)
HEMOGLOBIN: 10.3 g/dL — AB (ref 12.0–15.0)
MCH: 30.4 pg (ref 26.0–34.0)
MCHC: 32.6 g/dL (ref 30.0–36.0)
MCV: 93.2 fL (ref 78.0–100.0)
Platelets: 266 10*3/uL (ref 150–400)
RBC: 3.39 MIL/uL — AB (ref 3.87–5.11)
RDW: 14.5 % (ref 11.5–15.5)
WBC: 11.6 10*3/uL — ABNORMAL HIGH (ref 4.0–10.5)

## 2017-07-11 LAB — COMPREHENSIVE METABOLIC PANEL
ALK PHOS: 53 U/L (ref 38–126)
ALT: 14 U/L (ref 14–54)
ANION GAP: 8 (ref 5–15)
AST: 17 U/L (ref 15–41)
Albumin: 2.7 g/dL — ABNORMAL LOW (ref 3.5–5.0)
BUN: 22 mg/dL — ABNORMAL HIGH (ref 6–20)
CHLORIDE: 111 mmol/L (ref 101–111)
CO2: 20 mmol/L — AB (ref 22–32)
Calcium: 8.1 mg/dL — ABNORMAL LOW (ref 8.9–10.3)
Creatinine, Ser: 1.35 mg/dL — ABNORMAL HIGH (ref 0.44–1.00)
GFR calc non Af Amer: 35 mL/min — ABNORMAL LOW (ref >60)
GFR, EST AFRICAN AMERICAN: 41 mL/min — AB (ref >60)
GLUCOSE: 98 mg/dL (ref 65–99)
Potassium: 3.9 mmol/L (ref 3.5–5.1)
SODIUM: 139 mmol/L (ref 135–145)
Total Bilirubin: 0.9 mg/dL (ref 0.3–1.2)
Total Protein: 5 g/dL — ABNORMAL LOW (ref 6.5–8.1)

## 2017-07-11 LAB — TROPONIN I
TROPONIN I: 0.94 ng/mL — AB (ref <0.03)
TROPONIN I: 1.31 ng/mL — AB (ref <0.03)

## 2017-07-11 LAB — HEPARIN LEVEL (UNFRACTIONATED)
Heparin Unfractionated: 0.4 IU/mL (ref 0.30–0.70)
Heparin Unfractionated: 0.47 IU/mL (ref 0.30–0.70)

## 2017-07-11 LAB — MRSA PCR SCREENING: MRSA by PCR: NEGATIVE

## 2017-07-11 LAB — PROTIME-INR
INR: 1.06
Prothrombin Time: 13.8 seconds (ref 11.4–15.2)

## 2017-07-11 MED ORDER — ASPIRIN EC 81 MG PO TBEC
81.0000 mg | DELAYED_RELEASE_TABLET | Freq: Every day | ORAL | Status: DC
  Administered 2017-07-11 – 2017-07-15 (×5): 81 mg via ORAL
  Filled 2017-07-11 (×5): qty 1

## 2017-07-11 MED ORDER — PERFLUTREN LIPID MICROSPHERE
INTRAVENOUS | Status: AC
  Administered 2017-07-11: 2 mL via INTRAVENOUS
  Filled 2017-07-11: qty 10

## 2017-07-11 MED ORDER — METOPROLOL TARTRATE 50 MG PO TABS
50.0000 mg | ORAL_TABLET | Freq: Two times a day (BID) | ORAL | Status: DC
  Administered 2017-07-12 – 2017-07-13 (×3): 50 mg via ORAL
  Filled 2017-07-11 (×4): qty 1

## 2017-07-11 MED ORDER — HYDROCORTISONE 1 % EX CREA
TOPICAL_CREAM | Freq: Two times a day (BID) | CUTANEOUS | Status: DC | PRN
  Administered 2017-07-11: 12:00:00 via TOPICAL
  Administered 2017-07-15: 1 via TOPICAL
  Filled 2017-07-11 (×2): qty 28

## 2017-07-11 MED ORDER — MORPHINE SULFATE (PF) 4 MG/ML IV SOLN
2.0000 mg | INTRAVENOUS | Status: DC | PRN
  Administered 2017-07-11 – 2017-07-12 (×2): 2 mg via INTRAVENOUS
  Filled 2017-07-11 (×2): qty 1

## 2017-07-11 MED ORDER — HEPARIN (PORCINE) IN NACL 100-0.45 UNIT/ML-% IJ SOLN
700.0000 [IU]/h | INTRAMUSCULAR | Status: DC
  Filled 2017-07-11: qty 250

## 2017-07-11 MED ORDER — HEPARIN (PORCINE) IN NACL 100-0.45 UNIT/ML-% IJ SOLN
800.0000 [IU]/h | INTRAMUSCULAR | Status: DC
  Administered 2017-07-11: 800 [IU]/h via INTRAVENOUS

## 2017-07-11 MED ORDER — PERFLUTREN LIPID MICROSPHERE
1.0000 mL | INTRAVENOUS | Status: AC | PRN
  Administered 2017-07-11: 2 mL via INTRAVENOUS
  Filled 2017-07-11: qty 10

## 2017-07-11 MED FILL — Verapamil HCl IV Soln 2.5 MG/ML: INTRAVENOUS | Qty: 2 | Status: AC

## 2017-07-11 MED FILL — Lidocaine HCl Local Preservative Free (PF) Inj 1%: INTRAMUSCULAR | Qty: 30 | Status: AC

## 2017-07-11 NOTE — Consult Note (Signed)
[facility] BRIEF CONSULT NOTE  1 Day Post-Op  S/P Procedure(s) (LRB): LEFT HEART CATH AND CORONARY ANGIOGRAPHY (N/A) IABP Insertion (N/A)   Patient seen and examined, chart, ECHO and cath films reviewed.  I do not think that this patient should be considered a candidate for surgical revascularization.  Full note to follow.   Purcell Nailslarence H Kleo Dungee, MD 07/11/2017 4:01 PM

## 2017-07-11 NOTE — Progress Notes (Signed)
Came by to see patient tonight.  She had recurrent chest pain this afternoon requiring morphine as well as titrating nitroglycerin up.  She is currently pain-free.  She was seen by cardiac surgery who feels that she is not a candidate for redo surgery due to previous sternotomy, porcelain aorta.  Her predominant disease is severe ostial disease of the circumflex proximally and moderate left main disease.  The right coronary artery is not highly diseased.  She is alert and active and not currently having chest pain and very much wants to try to do something that would help her.  I spoke to Dr. Tonny BollmanMichael Cooper about the case and he is reviewed the films.  The calcification and angulation makes her case intervention potentially difficult.  She will be seen tomorrow by the interventional team [facility] after they have reviewed the films to further discuss options.  I discussed with her that she could potentially be a candidate but the risk would be increased.  If she was not a candidate we discussed medical treatment.  She will need to have aortic balloon taken out either later on tomorrow or Saturday morning.  All questions answered.  Darden PalmerW. Spencer Tilley, Jr. MD [facility] PaFACC .6:24 PM

## 2017-07-11 NOTE — Progress Notes (Signed)
ANTICOAGULATION CONSULT NOTE - Follow Up Consult  Pharmacy Consult for heparin Indication: IABP  Labs:  Recent Labs  07/10/17 1316 07/10/17 1941  HGB 11.2*  --   HCT 33.9*  --   PLT 271  --   LABPROT 12.9  --   INR 0.98  --   CREATININE 1.58*  --   TROPONINI  --  0.81*    Assessment: 83yo female started on heparin for NSTEMI, now s/p cath and IABP placement, to resume heparin.  Goal of Therapy:  Heparin level 0.2-0.5 units/ml Monitor platelets by anticoagulation protocol: Yes   Plan:  Will resume heparin gtt at 800 units/hr and monitor heparin levels and CBC.  Vernard GamblesVeronda Tris Howell, PharmD, BCPS  07/11/2017,12:48 AM

## 2017-07-11 NOTE — H&P (Signed)
CARDIOLOGY HISTORY & PHYSICAL   Referring Physician: Dr. Crista Curbana Liu Primary Physician: Daphine DeutscherMartin, Mary-Jerra Primary Cardiologist: Dr. Donnie Ahoilley Reason for Admission: ECG concerning for STEMI   HPI: Ms. [name] is an 83 yo woman with PMH of CAD with silent MI with LV aneurysm and rupture s/p repair 1999, PVD including carotid artery disease s/p carotid-subclavian bypass and CEA, aortofemoral bypass, AAA without rupture who presented today with shortness of breath, chest tightness, and feeling poorly. She had an initial episode of this several days ago, which resolved with rest, but today it occurred again. She denies syncope, nausea, vomiting, or diaphoresis. No recent illness, fevers, chills. She has not had any chest pain in many years. She is fairly independent and active, and she had not had limited symptoms until recently.  Today while in the ER, she was noted to have a change in her ECG with significant elevation of her ST segments in leads aVR and V1 with depressions in the other leads concerning for proximal coronary ischemia. Code STEMI called in ER, and patient urgently taken to cath lab.   PMH notable for HTN, HLD, CVA with visual loss 2008. On chronic coumadin (though INR subtherapeutic today). She denies any active blood loss, including GI and GU. Overall had felt well until recent episodes.  Review of Systems:     Cardiac Review of Systems: {Y] = yes [ ]  = no  Chest Pain [ Y tightness   ]  Resting SOB [  Y ] Exertional SOB  [  Y]  Orthopnea Klaus.Mock[N  ]   Pedal Edema [  N ]    Palpitations [ N ] Syncope  [ N ]   Presyncope [N   ]  General Review of Systems: [Y] = yes [  ]=no Constitional: recent weight change [  ]; anorexia [  ]; fatigue [  ]; nausea [  ]; night sweats [  ]; fever [  ]; or chills [  ];                                                                     Eyes : blurred vision [  ]; diplopia [   ]; vision changes [  ];  Amaurosis fugax[  ]; Resp: cough [  ];  wheezing[  ];   hemoptysis[  ];  PND [  ];  GI:  gallstones[  ], vomiting[  ];  dysphagia[  ]; melena[  ];  hematochezia [  ]; heartburn[  ];   GU: kidney stones [  ]; hematuria[  ];   dysuria [  ];  nocturia[  ]; incontinence [  ];             Skin: rash, swelling[  ];, hair loss[  ];  peripheral edema[  ];  or itching[  ]; Musculosketetal: myalgias[  ];  joint swelling[  ];  joint erythema[  ];  joint pain[  ];  back pain[  ];  Heme/Lymph: bruising[  ];  bleeding[  ];  anemia[  ];  Neuro: TIA[  ];  headaches[  ];  stroke[  ];  vertigo[  ];  seizures[  ];   paresthesias[  ];  difficulty walking[  ];  Psych:depression[  ];  anxiety[  ];  Endocrine: diabetes[  ];  thyroid dysfunction[  ];  Other:  Past Medical History:  Diagnosis Date  . Aortic aneurysm (HCC)   . 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 Cardiac aneurysm    ventrral - bypass   . Carotid artery occlusion   . Carotid bruit    bil carotid bruits   . Cataract   . GERD (gastroesophageal reflux disease)   . Gout   . H/O blood clots   . History of aorto-femoral bypass   . Hyperlipidemia   . Hypertension   . Lower back pain    disc   . Migraines   . Myocardial infarction (HCC)   . Osteoporosis   . Pancreatitis   . Peptic ulcer    Hx   . Stricture esophagus    Hx  . Stroke [facility])    Right eye stroke  . Urethral stenosis     Medications Prior to Admission  Medication Sig Dispense Refill  . amLODipine (NORVASC) 5 MG tablet Take 1 tablet (5 mg total) by mouth daily. 30 tablet 5  . atorvastatin (LIPITOR) 40 MG tablet Take 1 tablet (40 mg total) by mouth daily. 30 tablet 5  . HYDROcodone-acetaminophen (NORCO/VICODIN) 5-325 MG tablet Take 1 tablet by mouth every 6 (six) hours as needed. For pain 60 tablet 0  . metoprolol (LOPRESSOR) 50 MG tablet Take 1 tablet (50 mg total) by mouth 2 (two) times daily. (Patient taking differently: Take 50 mg by mouth daily. ) 60 tablet 5  . pantoprazole (PROTONIX) 40 MG tablet  Take 1 tablet (40 mg total) by mouth daily. (Patient taking differently: Take 40 mg by mouth 2 (two) times daily. ) 60 tablet 5  . predniSONE (DELTASONE) 20 MG tablet 2 po at same time daily for 5 days (Patient taking differently: Take 40 mg by mouth. 2 po at same time daily for 5 days) 10 tablet 0  . ranitidine (ZANTAC) 150 MG tablet Take 1 tablet (150 mg total) by mouth 2 (two) times daily. 30 tablet 3  . spironolactone (ALDACTONE) 50 MG tablet Take 1 tablet (50 mg total) by mouth daily. 30 tablet 3  . triamterene-hydrochlorothiazide (MAXZIDE-25) 37.5-25 MG tablet Take 1 tablet by mouth daily.    [name] warfarin (COUMADIN) 1 MG tablet Take 1-2 tablets (1-2 mg total) by mouth daily. 45 tablet 1     . amLODipine  10 mg Oral Daily  . aspirin EC  81 mg Oral Daily  . atorvastatin  40 mg Oral Daily  . famotidine  20 mg Oral Daily  . metoprolol tartrate  50 mg Oral Daily  . pantoprazole  40 mg Oral Daily  . sodium chloride flush  3 mL Intravenous Q12H  . sodium chloride flush  3 mL Intravenous Q12H    Infusions: . sodium chloride 100 mL/hr at 07/10/17 1739  . sodium chloride    . sodium chloride    . heparin 800 Units/hr (07/10/17 1743)  . nitroGLYCERIN      Allergies  Allergen Reactions  . Alendronate Sodium Other (See Comments)    Irritates esophagus and ulcers  . Buffered Aspirin Other (See Comments)    On blood thinners but doctor has started aspirin as stroke therapy  . Ibuprofen Other (See Comments)    Blood thinner therapy  . Prednisone Other (See Comments)    Patient states due to osteoporosis Ok if not long term    Social  History   Social History  . Marital status: Widowed    Spouse name: N/A  . Number of children: N/A  . Years of education: N/A   Occupational History  . 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 Smoking status: Former Smoker    Quit date: 04/06/1998  . Smokeless tobacco: Never Used  . Alcohol use No  . Drug use:  No  . Sexual activity: No   Other Topics Concern  . Not on file   Social History Narrative  . No narrative on file    Family History  Problem Relation Age of Onset  . CAD Father        died age 46+  . Heart disease Father        Older than 60  . Heart attack Father   . Osteoarthritis Sister        paraplegic due to disk rupture  . CAD Sister   . Arthritis Sister   . Varicose Veins Mother   . Colon cancer Neg Hx     PHYSICAL EXAM: Vitals:   07/10/17 2329 07/11/17 0000  BP: 116/70 116/70  Pulse: 81 85  Resp:  16  Temp:       Intake/Output Summary (Last 24 hours) at 07/11/17 0008 Last data filed at 07/10/17 1824  Gross per 24 hour  Intake              500 ml  Output             1200 ml  Net             -700 ml    General:  Frail, occasional shortness of breath while speaking HEENT: normal Neck: supple. no JVD. Carotids 2+ bilat. R carotid bruit. No lymphadenopathy or thryomegaly appreciated. Cor: PMI nondisplaced. Regular rate & rhythm. 3/6 holosystolic murmur at LLSB. 1+ right radial pulse, 2+ left radial pulse, 2+ bilateral femoral pulses Lungs: clear Abdomen: soft, nontender, nondistended. No hepatosplenomegaly. No bruits or masses. Good bowel sounds. Extremities: no cyanosis, clubbing, rash, edema Neuro: alert & oriented x 3,   ECG: sinus rhythm ST elevation in aVR and V1 with ST depressions in all other leads  Results for orders placed or performed during the hospital encounter of 07/10/17 (from the past 24 hour(s))  CBC with Differential     Status: Abnormal   Collection Time: 07/10/17  1:16 PM  Result Value Ref Range   WBC 12.7 (H) 4.0 - 10.5 K/uL   RBC 3.62 (L) 3.87 - 5.11 MIL/uL   Hemoglobin 11.2 (L) 12.0 - 15.0 g/dL   HCT 16.1 (L) 09.6 - 04.5 %   MCV 93.6 78.0 - 100.0 fL   MCH 30.9 26.0 - 34.0 pg   MCHC 33.0 30.0 - 36.0 g/dL   RDW 40.9 81.1 - 91.4 %   Platelets 271 150 - 400 K/uL   Neutrophils Relative % 92 %   Neutro Abs 11.7 (H) 1.7 - 7.7  K/uL   Lymphocytes Relative 4 %   Lymphs Abs 0.5 (L) 0.7 - 4.0 K/uL   Monocytes Relative 4 %   Monocytes Absolute 0.5 0.1 - 1.0 K/uL   Eosinophils Relative 0 %   Eosinophils Absolute 0.0 0.0 - 0.7 K/uL   Basophils Relative 0 %   Basophils Absolute 0.0 0.0 - 0.1 K/uL  Protime-INR     Status: None   Collection Time: 07/10/17  1:16 PM  Result Value Ref Range   Prothrombin Time 12.9 11.4 - 15.2 seconds   INR 0.98   Basic metabolic panel     Status: Abnormal   Collection Time: 07/10/17  1:16 PM  Result Value Ref Range   Sodium 133 (L) 135 - 145 mmol/L   Potassium 4.5 3.5 - 5.1 mmol/L   Chloride 104 101 - 111 mmol/L   CO2 21 (L) 22 - 32 mmol/L   Glucose, Bld 127 (H) 65 - 99 mg/dL   BUN 25 (H) 6 - 20 mg/dL   Creatinine, Ser 1.471.58 (H) 0.44 - 1.00 mg/dL   Calcium 8.2 (L) 8.9 - 10.3 mg/dL   GFR calc non Af Amer 29 (L) >60 mL/min   GFR calc Af Amer 34 (L) >60 mL/min   Anion gap 8 5 - 15  D-dimer, quantitative (not at [facility])     Status: Abnormal   Collection Time: 07/10/17  2:28 PM  Result Value Ref Range   D-Dimer, Quant 3.34 (H) 0.00 - 0.50 ug/mL-FEU  I-stat troponin, ED     Status: Abnormal   Collection Time: 07/10/17  2:30 PM  Result Value Ref Range   Troponin i, poc 0.69 (HH) 0.00 - 0.08 ng/mL   Comment NOTIFIED PHYSICIAN    Comment 3          Urinalysis, Routine w reflex microscopic     Status: Abnormal   Collection Time: 07/10/17  6:32 PM  Result Value Ref Range   Color, Urine STRAW (A) YELLOW   APPearance CLEAR CLEAR   Specific Gravity, Urine 1.004 (L) 1.005 - 1.030   pH 7.0 5.0 - 8.0   Glucose, UA NEGATIVE NEGATIVE mg/dL   Hgb urine dipstick NEGATIVE NEGATIVE   Bilirubin Urine NEGATIVE NEGATIVE   Ketones, ur NEGATIVE NEGATIVE mg/dL   Protein, ur NEGATIVE NEGATIVE mg/dL   Nitrite NEGATIVE NEGATIVE   Leukocytes, UA NEGATIVE NEGATIVE  Troponin I     Status: Abnormal   Collection Time: 07/10/17  7:41 PM  Result Value Ref Range   Troponin I 0.81 (HH) <0.03 ng/mL   Dg  Chest 2 View  Result Date: 07/10/2017 CLINICAL DATA:  Chest pain EXAM: CHEST  2 VIEW COMPARISON:  03/09/2014 FINDINGS: The heart is moderately enlarged. Left hemidiaphragm remains elevated and there is subsegmental atelectasis at the left base. Normal pulmonary vascularity. Post operative changes. No pneumothorax. IMPRESSION: Cardiomegaly without edema. Left basilar atelectasis. Electronically Signed   By: Jolaine ClickArthur  Hoss M.D.   On: 07/10/2017 15:10   Ct Head Wo Contrast  Result Date: 07/10/2017 CLINICAL DATA:  Severe headache EXAM: CT HEAD WITHOUT CONTRAST TECHNIQUE: Contiguous axial images were obtained from the base of the skull through the vertex without intravenous contrast. COMPARISON:  03/10/2010 FINDINGS: Brain: Diffuse cerebral atrophy. No acute intracranial abnormality. Specifically, no hemorrhage, hydrocephalus, mass lesion, acute infarction, or significant intracranial injury. Vascular: No hyperdense vessel or unexpected calcification. Skull: No acute calvarial abnormality. Sinuses/Orbits: Visualized paranasal sinuses and mastoids clear. Orbital soft tissues unremarkable. Other: None IMPRESSION: No acute intracranial abnormality. Electronically Signed   By: Charlett NoseKevin  Dover M.D.   On: 07/10/2017 15:16   Nm Pulmonary Perf And Vent  Result Date: 07/10/2017 CLINICAL DATA:  83 year old female with a history of syncope EXAM: NUCLEAR MEDICINE VENTILATION - PERFUSION LUNG SCAN TECHNIQUE: Ventilation images were obtained in multiple projections using inhaled aerosol Tc-7022m DTPA. Perfusion images were obtained in multiple projections after intravenous injection of Tc-4922m MAA. RADIOPHARMACEUTICALS:  32.0 mCi Technetium-5322m DTPA aerosol inhalation and  4.1 mCi Technetium-63m MAA IV COMPARISON:  Chest x-ray of the same date FINDINGS: Ventilation: No focal ventilation defect. Perfusion: No wedge shaped peripheral perfusion defects to suggest acute pulmonary embolism. IMPRESSION: Low probability study for pulmonary  emboli. Electronically Signed   By: Gilmer Mor D.O.   On: 07/10/2017 17:45   ASSESSMENT/PLAN: [name] is an 83 yo woman with PMH of CAD with silent MI with LV aneurysm and rupture s/p repair 1999, PVD including carotid artery disease s/p carotid-subclavian bypass and CEA, aortofemoral bypass, AAA without rupture who presented today with shortness of breath, chest tightness, and feeling poorly. While in the ER, she had dynamic ECG changes concerning for STEMI.  Cath shows   Ost LM to LM lesion, 75 %stenosed.  Ost Cx to Prox Cx lesion, 95 %stenosed.  LM lesion, 75 %stenosed.  Mid LAD-2 lesion, 80 %stenosed.  Mid LAD-1 lesion, 99 %stenosed.  1st Diag lesion, 25 %stenosed.  Mid RCA lesion, 25 %stenosed.  LV end diastolic pressure is normal.  There is no aortic valve stenosis.  Successful IABP placement.   Severe calcific left main, LAD and circumflex disease.  After intra-aortic balloon pump placement, her chest discomfort is gone. Repeat ECG shows significant improvement in the ST depression that was noted preprocedure. Would recommend consult and cardiac surgery in the morning. I'm not sure if she would be an operative candidate, but any attempt at PCI would have to involve atherectomy of the left main, LAD and circumflex and would be extremely high risk.    CAD, concern for STEMI: Has severe proximal disease not easily amenable to PCI. Symptoms relieved with NG and IABP support -continue IABP for ~48 hours for support -echo -consult cardiac surgery in AM; uncertain if she is a candidate for bypass surgery -continue heparin while on IABP, restart coumadin if no surgery planned. -start ASA 81 mg daily (no aspirin allergy per pt, marked in chart as allergy given her long term anticoagulation) -continue atorvastatin, consider increase to 80 mg daily -continue metoprolol (keep as BID or change to succinate, was reportedly only taking tartrate once daily) -no ACEI currently given  renal function  Hypertension: central aortic pressures much higher than peripherally measured pressures. Would manage aggressively to keep undue stress off the heart, but also need to make sure she maintains distal perfusion -continue metoprolol as above -increased amlodipine -short term, nitroglycerin drip and titrate to IABP pressures  Hyperlipidemia: statin as above  PVD: statin, aspirin, blood pressure control, chronic anticoagulation  Chronic kidney disease, stage 4: monitor for contrast induced nephropathy  FULL CODE, discussed with patient and her family. May need to readdress once options for surgery clarified.  Jodelle Red, MD, PhD overnight cardiology provider

## 2017-07-11 NOTE — Consult Note (Addendum)
[address]       [place] [id number]             [phone]          CARDIOTHORACIC SURGERY CONSULTATION REPORT  PCP is Bennie PieriniMartin, Mary-Mahima, FNP Referring Provider is Othella Boyerilley, William S, MD  Reason for consultation:  Left main coronary disease  HPI:  Patient is an 83 year old widowed white female with complex past medical history who has been referred for surgical consultation due to management of left main coronary artery disease with high-grade ostial stenosis of the left circumflex coronary artery and acute coronary syndrome.  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 She underwent surgical repair of the left ventricular aneurysm at that time. She has remained stable from a cardiac standpoint until recently. Other comorbid medical problems include hypertension, cerebrovascular disease with previous stroke, status post carotid subclavian bypass and carotid endarterectomy, severe peripheral vascular disease status post aortobifemoral bypass, hyperlipidemia, and chronic lower back pain. She apparently has been having symptoms of shortness of breath and chest tightness off and on for some time recently. She presented acutely yesterday evening with more prolonged episode of severe substernal chest pain radiating to her shoulder associated with shortness of breath. EKG on arrival reveals significant ST segment elevation and she was taken directly to the cardiac Cath Lab by Dr. Eldridge DaceVaranasi.  She was found to have left main coronary artery stenosis with high grade ostial stenosis of the left circumflex coronary artery. An intra-aortic balloon pump was placed and the patient improved clinically, although she has continued to have intermittent chest pain off and on ever since. Transthoracic echocardiogram was performed and cardiothoracic  surgical consultation was requested.   Past Medical History:  Diagnosis Date  . Aortic aneurysm (HCC)   . 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 Cardiac aneurysm    ventrral - bypass   . Carotid artery occlusion   . Carotid bruit    bil carotid bruits   . Cataract   . GERD (gastroesophageal reflux disease)   . Gout   . H/O blood clots   . History of aorto-femoral bypass   . Hyperlipidemia   . Hypertension   . Lower back pain    disc   . Migraines   . Myocardial infarction (HCC)   . Osteoporosis   . Pancreatitis   . Peptic ulcer    Hx   . Stricture esophagus    Hx  . Stroke [facility](HCC)    Right eye stroke  . Urethral stenosis     Past Surgical History:  Procedure Laterality Date  . ABDOMINAL HYSTERECTOMY    . AORTA - BILATERAL FEMORAL ARTERY BYPASS GRAFT  7/99   Dr. Madilyn FiremanHayes   . aortic & cardiac aneurysm rupture  04/1998  . APPENDECTOMY    . ARCH AORTOGRAM N/A 11/17/2012   Procedure: ARCH AORTOGRAM;  Surgeon: Chuck Hinthristopher S Dickson, MD;  [facility] LAB;  Service: Cardiovascular;  Laterality: N/A;  . CAROTID ENDARTERECTOMY  July 2003   Left  . CAROTID ENDARTERECTOMY  11/18/2007   Left  . CORONARY ARTERY BYPASS GRAFT     16 yrs ago  . ESOPHAGEAL DILATION  1996  . ESOPHAGOGASTRODUODENOSCOPY (EGD) WITH PROPOFOL N/A 07/12/2015   Procedure: ESOPHAGOGASTRODUODENOSCOPY (EGD) WITH PROPOFOL with possible dilatation;  Surgeon: Bernette Redbirdobert Buccini, MD;  Location: [facility] ENDOSCOPY;  Service: Endoscopy;  Laterality: N/A;  Consideration of endotracheal intubation for airway protection  . EYE SURGERY    . IABP INSERTION N/A 07/10/2017   Procedure: IABP Insertion;  Surgeon: Corky Crafts, MD;  Location: [facility] INVASIVE CV LAB;  Service: Cardiovascular;  Laterality: N/A;  . LEFT HEART CATH AND CORONARY ANGIOGRAPHY N/A 07/10/2017   Procedure: LEFT HEART CATH AND CORONARY ANGIOGRAPHY;  Surgeon: Corky Crafts, MD;  Location: [facility] INVASIVE CV LAB;  Service:  Cardiovascular;  Laterality: N/A;  . LUMBAR DISC SURGERY  1993  . PARTIAL HYSTERECTOMY  1966   single oopherectomy (right)   . STOMACH SURGERY     ruptured ulcer    Family History  Problem Relation Age of Onset  . CAD Father        died age 19+  . Heart disease Father        Older than 60  . Heart attack Father   . Osteoarthritis Sister        paraplegic due to disk rupture  . CAD Sister   . Arthritis Sister   . Varicose Veins Mother   . Colon cancer Neg Hx     Social History   Social History  . Marital status: Widowed    Spouse name: N/A  . Number of children: N/A  . Years of education: N/A   Occupational History  . 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 Smoking status: Former Smoker    Quit date: 04/06/1998  . Smokeless tobacco: Never Used  . Alcohol use No  . Drug use: No  . Sexual activity: No   Other Topics Concern  . Not on file   Social History Narrative  . No narrative on file    Prior to Admission medications   Medication Sig Start Date End Date Taking? Authorizing Provider  amLODipine (NORVASC) 5 MG tablet Take 1 tablet (5 mg total) by mouth daily. 02/08/17  Yes Daphine Deutscher, Mary-Maleeka, FNP  atorvastatin (LIPITOR) 40 MG tablet Take 1 tablet (40 mg total) by mouth daily. 02/08/17  Yes Daphine Deutscher, Mary-Seylah, FNP  HYDROcodone-acetaminophen (NORCO/VICODIN) 5-325 MG tablet Take 1 tablet by mouth every 6 (six) hours as needed. For pain 05/07/17  Yes Daphine Deutscher, Mary-Renay, FNP  metoprolol (LOPRESSOR) 50 MG tablet Take 1 tablet (50 mg total) by mouth 2 (two) times daily. Patient taking differently: Take 50 mg by mouth daily.  02/08/17  Yes Martin, Mary-Barney, FNP  pantoprazole (PROTONIX) 40 MG tablet Take 1 tablet (40 mg total) by mouth daily. Patient taking differently: Take 40 mg by mouth 2 (two) times daily.  02/08/17  Yes Martin, Mary-Keshara, FNP  predniSONE (DELTASONE) 20 MG tablet 2 po at same time daily for 5  days Patient taking differently: Take 40 mg by mouth. 2 po at same time daily for 5 days 07/05/17  Yes Dettinger, Elige Radon, MD  ranitidine (ZANTAC) 150 MG tablet Take 1 tablet (150 mg total) by mouth 2 (two) times daily. 05/07/17  Yes Martin, Mary-Avaline, FNP  spironolactone (ALDACTONE) 50 MG tablet Take 1 tablet (50 mg total) by mouth daily. 07/08/17  Yes Dettinger, Elige Radon, MD  triamterene-hydrochlorothiazide (MAXZIDE-25) 37.5-25 MG tablet Take 1 tablet by mouth daily. 07/01/17  Yes [provider]  warfarin (COUMADIN) 1 MG tablet Take 1-2 tablets (1-2 mg total) by mouth daily. 05/28/17  Yes Henrene Pastor, PharmD    Current Facility-Administered Medications  Medication Dose Route  Frequency Provider Last Rate Last Dose  . 0.9 %  sodium chloride infusion   Intravenous Continuous Renne Crigler, PA-C 100 mL/hr at 07/10/17 1739    . 0.9 %  sodium chloride infusion  250 mL Intravenous PRN Corky Crafts, MD      . acetaminophen (TYLENOL) tablet 650 mg  650 mg Oral Q4H PRN Corky Crafts, MD      . amLODipine (NORVASC) tablet 10 mg  10 mg Oral Daily Corky Crafts, MD   10 mg at 07/11/17 1013  . aspirin EC tablet 81 mg  81 mg Oral Daily Jodelle Red, MD   81 mg at 07/11/17 1013  . atorvastatin (LIPITOR) tablet 40 mg  40 mg Oral Daily Marcos Eke, PA-C   40 mg at 07/11/17 1013  . famotidine (PEPCID) tablet 20 mg  20 mg Oral Daily Marcos Eke, PA-C   20 mg at 07/11/17 1013  . heparin ADULT infusion 100 units/mL (25000 units/233mL sodium chloride 0.45%)  800 Units/hr Intravenous Continuous Juliette Mangle, RPH 8 mL/hr at 07/11/17 0800 800 Units/hr at 07/11/17 0800  . HYDROcodone-acetaminophen (NORCO/VICODIN) 5-325 MG per tablet 1 tablet  1 tablet Oral Q6H PRN Marcos Eke, PA-C      . hydrocortisone cream 1 %   Topical BID PRN Othella Boyer, MD      . Melene Muller ON 07/12/2017] metoprolol tartrate (LOPRESSOR) tablet 50 mg  50 mg Oral BID Jodelle Red,  MD      . morphine 4 MG/ML injection 2 mg  2 mg Intravenous Q2H PRN Othella Boyer, MD   2 mg at 07/11/17 1505  . nitroGLYCERIN 50 mg in dextrose 5 % 250 mL (0.2 mg/mL) infusion  0-200 mcg/min Intravenous Titrated Corky Crafts, MD 9 mL/hr at 07/11/17 1513 30 mcg/min at 07/11/17 1513  . ondansetron (ZOFRAN) injection 4 mg  4 mg Intravenous Q6H PRN Corky Crafts, MD      . pantoprazole (PROTONIX) EC tablet 40 mg  40 mg Oral Daily Marcos Eke, PA-C   40 mg at 07/11/17 1013  . sodium chloride flush (NS) 0.9 % injection 3 mL  3 mL Intravenous Q12H Wertman, Sara E, PA-C   3 mL at 07/11/17 1014  . sodium chloride flush (NS) 0.9 % injection 3 mL  3 mL Intravenous Q12H Corky Crafts, MD   3 mL at 07/11/17 0300  . sodium chloride flush (NS) 0.9 % injection 3 mL  3 mL Intravenous PRN Corky Crafts, MD      . technetium TC 39M diethylenetriame-pentaacetic acid (DTPA) injection 32 millicurie  32 millicurie Inhalation Once PRN Obie Dredge, MD        Allergies  Allergen Reactions  . Alendronate Sodium Other (See Comments)    Irritates esophagus and ulcers  . Buffered Aspirin Other (See Comments)    On blood thinners but doctor has started aspirin as stroke therapy  . Ibuprofen Other (See Comments)    Blood thinner therapy  . Prednisone Other (See Comments)    Patient states due to osteoporosis Ok if not long term      Review of Systems:  Per HPI and records in chart     Physical Exam:   BP (!) 84/49   Pulse 84   Temp 98.1 F (36.7 C) (Oral)   Resp 18   Ht 5\' 5"  (1.651 m)   Wt 145 lb 8.1 oz (66 kg)   SpO2 96%  BMI 24.21 kg/m   General:  Elderly female NAD with IABP in place via right femoral approach  HEENT:  Unremarkable   Neck:   no JVD, no bruits, no adenopathy   Chest:   clear to auscultation, symmetrical breath sounds, no wheezes, no rhonchi   CV:   RRR, grade III/VI systolic murmur   Abdomen:  soft, non-tender, no masses    Extremities:  warm, well-perfused, pulses not palpable, trace lower extremity edema  Rectal/GU  Deferred  Neuro:   Grossly non-focal and symmetrical throughout  Skin:   Clean and dry, no rashes, no breakdown  Diagnostic Tests:  IABP Insertion  LEFT HEART CATH AND CORONARY ANGIOGRAPHY  Conclusion     Ost LM to LM lesion, 75 %stenosed.  Ost Cx to Prox Cx lesion, 95 %stenosed.  LM lesion, 75 %stenosed.  Mid LAD-2 lesion, 80 %stenosed.  Mid LAD-1 lesion, 99 %stenosed.  1st Diag lesion, 25 %stenosed.  Mid RCA lesion, 25 %stenosed.  LV end diastolic pressure is normal.  There is no aortic valve stenosis.  Successful IABP placement.   Severe calcific left main, LAD and circumflex disease.  After intra-aortic balloon pump placement, her chest discomfort is gone. Repeat ECG shows significant improvement in the ST depression that was noted preprocedure. Would recommend consult and cardiac surgery in the morning. I'm not sure if she would be an operative candidate, but any attempt at PCI would have to involve atherectomy of the left main, LAD and circumflex and would be extremely high risk.    Plan to support her with the intra-aortic balloon pump and treat her systemic hypertension with IV nitroglycerin. Once these interventions were started even in the Cath Lab, she felt better. She no longer has any chest discomfort and her oxygen saturations have improved. Further management per Dr. Donnie Aho.       Indications   Non-ST elevation (NSTEMI) myocardial infarction (HCC) [I21.4 (ICD-10-CM)]  Procedural Details/Technique   Technical Details Called to evaluate patient due to diffuse and worsening ST segment depressions in this patient who had been reporting exertional shortness of breath. She has a complex cardiac history including prior MI, rupture of the anterior wall and aneurysmectomy in 1999. She was having some intermittent chest discomfort while in the emergency room  and ECG showed significant changes showing global ischemia. Of note, she had not had any coronary artery bypass but had carotid subclavian bypass, aortobifemoral bypass and carotid endarterectomy. She did not have palpable right radial pulse. During the procedure, arterial line/central aortic pressure was significantly higher than cuff pressure obtained from either arm, approximately 80 mmHg.   The risks, benefits, and details of the procedure were explained to the patient. The patient verbalized understanding and wanted to proceed. Emergency consent was obtained.  PROCEDURE TECHNIQUE: After Xylocaine anesthesia, a 40F sheath was placed in the right femoral artery area, in the aortofemoral graft, with an anterior needle wall stick. Initially, the wire selectedthe native iliac but was redirected to the graft. Left coronary angiography was done using a Judkins L4 guide catheter. Right coronary angiography was done using a Judkins R4 guide catheter. Left heart cath was done using a JR4 catheter.   The 6 French sheath was replaced for the 7.5 French IABP sheath. The balloon pump was placed under fluoroscopic guidance. It was secured in place and placed on one-to-one.    Contrast: 75     Estimated blood loss <50 mL.  During this procedure the patient was administered the following to  achieve and maintain moderate conscious sedation: Fentanyl 25 mcg, while the patient's heart rate, blood pressure, and oxygen saturation were continuously monitored.    Complications   Complications documented before study signed (07/10/2017 11:40 PM EDT)    No complications were associated with this study.  Documented by Corky Crafts, MD - 07/10/2017 11:34 PM EDT    Coronary Findings   Dominance: Right  Left Main  Ost LM to LM lesion, 75% stenosed.  LM lesion, 75% stenosed.  Left Anterior Descending  Mid LAD-1 lesion, 99% stenosed.  Mid LAD-2 lesion, 80% stenosed.  First Diagonal Branch  1st Diag  lesion, 25% stenosed.  Second Diagonal Branch  Vessel is small in size.  Left Circumflex  Ost Cx to Prox Cx lesion, 95% stenosed. The lesion is calcified.  Right Coronary Artery  Mid RCA lesion, 25% stenosed.  Impella/IABP   Hemodynamic Support An IABP was inserted for hemodynamic support in the setting of cardiogenic shock.    Left Heart   Left Ventricle LV end diastolic pressure is normal.    Aortic Valve There is no aortic valve stenosis.    Coronary Diagrams   Diagnostic Diagram       Implants     No implant documentation for this case.  PACS Images   Show images for CARDIAC CATHETERIZATION   Link to Procedure Log   Procedure Log    Hemo Data    Most Recent Value  AO Systolic Pressure 193 mmHg  AO Diastolic Pressure 71 mmHg  AO Mean 122 mmHg  LV Systolic Pressure 203 mmHg  LV Diastolic Pressure 8 mmHg  LV EDP 13 mmHg  Arterial Occlusion Pressure Extended Systolic Pressure 195 mmHg  Arterial Occlusion Pressure Extended Diastolic Pressure 66 mmHg  Arterial Occlusion Pressure Extended Mean Pressure 117 mmHg  Left Ventricular Apex Extended Systolic Pressure 203 mmHg  Left Ventricular Apex Extended Diastolic Pressure 8 mmHg  Left Ventricular Apex Extended EDP Pressure 12 mmHg     Transthoracic Echocardiography  Patient:    [name], Talamante MR #:       [id number] Study Date: 07/11/2017 Gender:     F Age:        83 Height:     165.1 cm Weight:     66 kg BSA:        1.75 m^2 Pt. Status: Room:       [facility]   ATTENDING    Georga Hacking, MD [facility]  ADMITTING    [place], [facility] S  ORDERING     [place], [place] S  REFERRING    [place], [place] S  PERFORMING   [facility], Inpatient  SONOGRAPHER  Dance, Tiffany  cc:  ------------------------------------------------------------------- LV EF: 50% -   55%  ------------------------------------------------------------------- Indications:      Syncope  780.2.  ------------------------------------------------------------------- History:   PMH:  Aortic Aneurysm.  Stroke.  PMH:   Myocardial infarction.  Risk factors:  Hypertension. Dyslipidemia.  ------------------------------------------------------------------- Study Conclusions  - Left ventricle: The cavity size was normal. There was moderate   concentric hypertrophy. Systolic function was normal. The   estimated ejection fraction was in the range of 50% to 55%.   Hypokinesis of the anteroseptal myocardium. Doppler parameters   are consistent with abnormal left ventricular relaxation (grade 1   diastolic dysfunction). Doppler parameters are consistent with   high ventricular filling pressure. - Aortic valve: Valve mobility was restricted. There was moderate   stenosis. There was no regurgitation. Valve area (VTI): 1.11  cm^2. Valve area (Vmax): 0.92 cm^2. Valve area (Vmean): 1 cm^2. - Mitral valve: Transvalvular velocity was within the normal range.   There was no evidence for stenosis. There was trivial   regurgitation. - Left atrium: The atrium was mildly dilated. - Right ventricle: The cavity size was normal. Wall thickness was   normal. Systolic function was normal. - Tricuspid valve: There was trivial regurgitation.  ------------------------------------------------------------------- Study data:  The previous study was not available, so comparison was made to the report of 03/28/2015.  Study status:  Routine. Procedure:  The patient reported no pain pre or post test. Transthoracic echocardiography. Image quality was adequate.  Study completion:  There were no complications.          Transthoracic echocardiography.  M-mode, complete 2D, spectral Doppler, and color Doppler.  Birthdate:  Patient birthdate: 1932/01/31.  Age:  Patient is 83 yr old.  Sex:  Gender: female.    BMI: 24.2 kg/m^2.  Blood pressure:     84/74  Patient status:  Inpatient.  Study date: Study date:  07/11/2017. Study time: 12:41 PM.  Location:  Bedside.   -------------------------------------------------------------------  ------------------------------------------------------------------- Left ventricle:  The cavity size was normal. There was moderate concentric hypertrophy. Systolic function was normal. The estimated ejection fraction was in the range of 50% to 55%.  Regional wall motion abnormalities:   Hypokinesis of the anteroseptal myocardium. Doppler parameters are consistent with abnormal left ventricular relaxation (grade 1 diastolic dysfunction). Doppler parameters are consistent with high ventricular filling pressure.  ------------------------------------------------------------------- Aortic valve:   Trileaflet; moderately thickened, moderately calcified leaflets. Valve mobility was restricted.  Doppler: There was moderate stenosis.   There was no regurgitation.    VTI ratio of LVOT to aortic valve: 0.39. Valve area (VTI): 1.11 cm^2. Indexed valve area (VTI): 0.63 cm^2/m^2. Peak velocity ratio of LVOT to aortic valve: 0.32. Valve area (Vmax): 0.92 cm^2. Indexed valve area (Vmax): 0.53 cm^2/m^2. Mean velocity ratio of LVOT to aortic valve: 0.35. Valve area (Vmean): 1 cm^2. Indexed valve area (Vmean): 0.57 cm^2/m^2.    Mean gradient (S): 32 mm Hg. Peak gradient (S): 74 mm Hg.  ------------------------------------------------------------------- Aorta:  Aortic root: The aortic root was normal in size.  ------------------------------------------------------------------- Mitral valve:   Structurally normal valve.   Mobility was not restricted.  Doppler:  Transvalvular velocity was within the normal range. There was no evidence for stenosis. There was trivial regurgitation.    Peak gradient (D): 2 mm Hg.  ------------------------------------------------------------------- Left atrium:  The atrium was mildly  dilated.  ------------------------------------------------------------------- Right ventricle:  The cavity size was normal. Wall thickness was normal. Systolic function was normal.  ------------------------------------------------------------------- Pulmonic valve:    Structurally normal valve.   Cusp separation was normal.  Doppler:  Transvalvular velocity was within the normal range. There was no evidence for stenosis. There was no regurgitation.  ------------------------------------------------------------------- Tricuspid valve:   Structurally normal valve.    Doppler: Transvalvular velocity was within the normal range. There was trivial regurgitation.  ------------------------------------------------------------------- Pulmonary artery:   The main pulmonary artery was normal-sized. Systolic pressure could not be accurately estimated.  ------------------------------------------------------------------- Right atrium:  The atrium was normal in size.  ------------------------------------------------------------------- Pericardium:  There was no pericardial effusion.  ------------------------------------------------------------------- Systemic veins: Inferior vena cava: The vessel was normal in size. The respirophasic diameter changes were in the normal range (>= 50%), consistent with normal central venous pressure.  ------------------------------------------------------------------- Measurements   Left ventricle  Value          Reference  LV ID, ED, PLAX chordal          (L)     29.4  mm       43 - 52  LV ID, ES, PLAX chordal          (L)     21.5  mm       23 - 38  LV fx shortening, PLAX chordal   (L)     27    %        >=29  LV PW thickness, ED                      15.1  mm       ----------  IVS/LV PW ratio, ED                      0.94           <=1.3  Stroke volume, 2D                        93    ml       ----------  Stroke  volume/bsa, 2D                    53    ml/m^2   ----------  LV e&', lateral                           3.92  cm/s     ----------  LV E/e&', lateral                         19.18          ----------  LV e&', medial                            4.13  cm/s     ----------  LV E/e&', medial                          18.21          ----------  LV e&', average                           4.03  cm/s     ----------  LV E/e&', average                         18.68          ----------    Ventricular septum                       Value          Reference  IVS thickness, ED                        14.2  mm       ----------    LVOT                                     Value  Reference  LVOT ID, S                               19    mm       ----------  LVOT area                                2.84  cm^2     ----------  LVOT peak velocity, S                    139   cm/s     ----------  LVOT mean velocity, S                    88.9  cm/s     ----------  LVOT VTI, S                              32.9  cm       ----------  LVOT peak gradient, S                    8     mm Hg    ----------    Aortic valve                             Value          Reference  Aortic valve peak velocity, S            429   cm/s     ----------  Aortic valve mean velocity, S            253   cm/s     ----------  Aortic valve VTI, S                      84.4  cm       ----------  Aortic mean gradient, S                  32    mm Hg    ----------  Aortic peak gradient, S                  74    mm Hg    ----------  VTI ratio, LVOT/AV                       0.39           ----------  Aortic valve area, VTI                   1.11  cm^2     ----------  Aortic valve area/bsa, VTI               0.63  cm^2/m^2 ----------  Velocity ratio, peak, LVOT/AV            0.32           ----------  Aortic valve area, peak velocity         0.92  cm^2     ----------  Aortic valve area/bsa, peak              0.53  cm^2/m^2 ----------  velocity   Velocity ratio, mean, LVOT/AV  0.35           ----------  Aortic valve area, mean velocity         1     cm^2     ----------  Aortic valve area/bsa, mean              0.57  cm^2/m^2 ----------  velocity    Aorta                                    Value          Reference  Aortic root ID, ED                       32    mm       ----------    Left atrium                              Value          Reference  LA ID, A-P, ES                           32    mm       ----------  LA ID/bsa, A-P                           1.83  cm/m^2   <=2.2  LA volume, S                             50.6  ml       ----------  LA volume/bsa, S                         28.9  ml/m^2   ----------  LA volume, ES, 1-p A4C                   53.2  ml       ----------  LA volume/bsa, ES, 1-p A4C               30.4  ml/m^2   ----------  LA volume, ES, 1-p A2C                   48.2  ml       ----------  LA volume/bsa, ES, 1-p A2C               27.6  ml/m^2   ----------    Mitral valve                             Value          Reference  Mitral E-wave peak velocity              75.2  cm/s     ----------  Mitral A-wave peak velocity              91.2  cm/s     ----------  Mitral deceleration time                 187   ms       150 - 230  Mitral peak gradient, D                  2     mm Hg    ----------  Mitral E/A ratio, peak                   0.8            ----------    Right atrium                             Value          Reference  RA ID, S-I, ES, A4C              (H)     62    mm       34 - 49  RA area, ES, A4C                         14.6  cm^2     8.3 - 19.5  RA volume, ES, A/L                       28.5  ml       ----------  RA volume/bsa, ES, A/L                   16.3  ml/m^2   ----------    Systemic veins                           Value          Reference  Estimated CVP                            3     mm Hg    ----------    Right ventricle                          Value          Reference  RV  ID, minor axis, ED, A4C base          27.7  mm       ----------  TAPSE                                    13.4  mm       ----------  RV s&', lateral, S                        12.4  cm/s     ----------  Legend: (L)  and  (H)  mark values outside specified reference range.  ------------------------------------------------------------------- Prepared and Electronically Authenticated by  Chilton Si, MD 2018-08-02T16:31:52   Impression:  I have personally reviewed the patient's diagnostic cardiac catheterization and transthoracic echocardiogram. Patient presents with acute coronary syndrome and has moderate left main coronary artery disease with high-grade stenosis of the mid left anterior descending coronary artery and ostial stenosis of left circumflex coronary artery. She also has probably moderate aortic stenosis.  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 She also has severe cerebrovascular  disease and peripheral vascular disease.  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   An attempt at surgical revascularization with or without aortic valve replacement would be further complicated by the patient's previous cardiac surgery which would make it difficult and possibly impossible to graft the obtuse marginal branches of the terminal portion of the left circumflex system.    Plan:   I do not feel this elderly patient should be considered a candidate for high risk surgical intervention and would recommend consideration of possible high risk PCI versus palliative medical therapy.  I discussed matters at the bedside with the patient and her family in over the telephone with Dr. Donnie Aho.    All questions answered.   I spent in excess of 60 minutes during the conduct of this hospital consultation and >50% of this time involved direct face-to-face encounter for counseling and/or coordination of the patient's care.    Salvatore Decent. Cornelius Moras, MD 07/11/2017 3:59 PM

## 2017-07-11 NOTE — Plan of Care (Signed)
Problem: Cardiovascular: Goal: Vascular access site(s) Level 0-1 will be maintained Outcome: Progressing Right femoral site has remained a level zero post catheterization

## 2017-07-11 NOTE — Progress Notes (Signed)
ANTICOAGULATION CONSULT NOTE - Follow Up Consult  Pharmacy Consult for heparin Indication: IABP  Labs:  Recent Labs  07/10/17 1316 07/10/17 1941 07/11/17 0437 07/11/17 0808 07/11/17 1608  HGB 11.2*  --  10.3*  --   --   HCT 33.9*  --  31.6*  --   --   PLT 271  --  266  --   --   LABPROT 12.9  --  13.8  --   --   INR 0.98  --  1.06  --   --   HEPARINUNFRC  --   --   --  0.40 0.47  CREATININE 1.58*  --  1.35*  --   --   TROPONINI  --  0.81* 0.94* 1.31*  --    Medications: Heparin @ 800 units/hr  Assessment: 85yoF s/p cath for STEMI continues on heparin with IABP in place. Not CABG candidate. Confirmatory heparin level 0.47 is therapeutic but trending up. With lower goal will decrease rate slightly. No bleeding reported.  Goal of Therapy:  Heparin level 0.2-0.5 units/ml Monitor platelets by anticoagulation protocol: Yes   Plan:  1) Decrease heparin to 750 units/hr 2) Daily heparin level and CBC  Maria CasaJennifer Rosemond [name], PharmD, BCPS 07/11/2017, 5:25 PM

## 2017-07-11 NOTE — Progress Notes (Signed)
Pt c/o midsternal chest pain/pressure, SOB, and tingling down right arm. EKG obtained and Dr. Donnie Ahoilley called for PRN pain medication. Morphine ordered and Nitroglycerin gtt titrated for pain relief as BP allows. Awaiting CVTS consult.

## 2017-07-11 NOTE — Plan of Care (Signed)
Problem: Activity: Goal: Ability to return to baseline activity level will improve Outcome: Not Progressing Pt is not progressing because of bedrest due to IABP.   Problem: Cardiovascular: Goal: Ability to achieve and maintain adequate cardiovascular perfusion will improve Outcome: Progressing Pt is feels better on IABP and is maintaining hemodynamic parameters.

## 2017-07-11 NOTE — Care Management Note (Addendum)
Case Management Note  Patient Details  Name: [name] MRN: [id number] Date of Birth: 11-06-1932  Subjective/Objective:   From home alone, but stays with her niece , Vickie [phone] at night she also has a sister who is  Supportive as well.  She presents with Non-STEMI with severe left main and three-vessel coronary artery disease s/p IABP insertion.  Surgery will see her later today about poss CABG.   8/3 1117 Letha Capeeborah Nathaneil Feagans RN, BSN - per Cards note will not be able to do Impella support device, due to her peripheral vascular disease and aortobifemoral bypass would be very high risk of not surviving the procedure. Plan to remove balloon pump. If she has more discomfort with balloon pump out she would not be a candidate for cardiac cath per Cards note.  Plan to treat pian with narcotics and cont iv ntg, will try to transition to isosorbide and antiplatelet (plavix).                    Action/Plan: NCM will follow for dc needs.   Expected Discharge Date:                  Expected Discharge Plan:     In-House Referral:     Discharge planning Services  CM Consult  Post Acute Care Choice:    Choice offered to:     DME Arranged:    DME Agency:     HH Arranged:    HH Agency:     Status of Service:  In process, will continue to follow  If discussed at Long Length of Stay Meetings, dates discussed:    Additional Comments:  Leone Havenaylor, Janeth Terry Clinton, RN 07/11/2017, 10:09 AM

## 2017-07-11 NOTE — Progress Notes (Signed)
RN called Dr. Cristal Deerhristopher regarding IABP pressures and nitro dosage. Currently on 160mcg of Nitro with MAPs in 60s-low 80s. MD said to keep MAPs 80s-90s due to previous HTN history and to reduce amount of nitro ordered to reach this goal. RN will make changes and continue to monitor.

## 2017-07-11 NOTE — Progress Notes (Signed)
Per Dr. Eldridge DaceVaranasi, Goal MAP for Nitro titration is 80s.

## 2017-07-11 NOTE — Progress Notes (Signed)
ANTICOAGULATION CONSULT NOTE - Follow Up Consult  Pharmacy Consult for heparin Indication: IABP  Labs:  Recent Labs  07/10/17 1316 07/10/17 1941 07/11/17 0437 07/11/17 0808  HGB 11.2*  --  10.3*  --   HCT 33.9*  --  31.6*  --   PLT 271  --  266  --   LABPROT 12.9  --  13.8  --   INR 0.98  --  1.06  --   HEPARINUNFRC  --   --   --  0.40  CREATININE 1.58*  --  1.35*  --   TROPONINI  --  0.81* 0.94* 1.31*    Assessment: 85yoF s/p cath for STEMI with IABP placed and started on heparin while awaiting surgical consult for possible CABG. Initial heparin level therapeutic at 0.40, CBC stable, no S/Sx bleeding noted.  Goal of Therapy:  Heparin level 0.2-0.5 units/ml Monitor platelets by anticoagulation protocol: Yes   Plan:  -Continue heparin at 800 units/hr -Check 8-hr confirmatory heparin level -Monitor heparin level, CBC, S/Sx bleeding daily -Follow-up surgical consult recommendations  Fredonia HighlandMichael Tommie Bohlken, PharmD PGY-2 Cardiology Pharmacy Resident Pager: [phone] 07/11/2017

## 2017-07-11 NOTE — Progress Notes (Signed)
Subjective:  Complete chart and notes reviewed since the evaluation last night.  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  She has done quite well through the years but does have some mild shortness of breath.  Found to have severe left main and three-vessel disease last night.  She stabilized on balloon pump through the previous graft and now has been asymptomatic.  Minimal elevation of troponin.  Objective:  Vital Signs in the last 24 hours: BP 98/72   Pulse 80   Temp 98.5 F (36.9 C) (Oral)   Resp 16   Ht 5\' 5"  (1.651 m)   Wt 66 kg (145 lb 8.1 oz)   SpO2 95%   BMI 24.21 kg/m  She is lying in bed currently in no acute distress.  She is quite alert and not complaining of any chest pain. Physical Exam: Pleasant female in no acute distress Lungs:  Clear Cardiac:  Regular rhythm, normal S1 and S2, no S3, 1/6 systolic murmur Abdomen:  Soft, nontender, no masses Extremities:  Right foot is well perfused, balloon pump is present in the right groin  Intake/Output from previous day: 08/01 0701 - 08/02 0700 In: 1306.7 [I.V.:1306.7] Out: 2050 [Urine:2050]  Weight Filed Weights   07/10/17 1257 07/11/17 0500  Weight: 68 kg (150 lb) 66 kg (145 lb 8.1 oz)    Lab Results: Basic Metabolic Panel:  Recent Labs  16/09/9607/01/18 1316 07/11/17 0437  NA 133* 139  K 4.5 3.9  CL 104 111  CO2 21* 20*  GLUCOSE 127* 98  BUN 25* 22*  CREATININE 1.58* 1.35*   CBC:  Recent Labs  07/10/17 1316 07/11/17 0437  WBC 12.7* 11.6*  NEUTROABS 11.7*  --   HGB 11.2* 10.3*  HCT 33.9* 31.6*  MCV 93.6 93.2  PLT 271 266   Cardiac Enzymes: Troponin (Point of Care Test)  Recent Labs  07/10/17 1430  TROPIPOC 0.69*   Cardiac Panel (last 3 results)  Recent Labs  07/10/17 1941 07/11/17 0437 07/11/17 0808  TROPONINI 0.81* 0.94* 1.31*     Telemetry: Sinus rhythm with occasional PVCs  Assessment/Plan:  1.  Non-STEMI with severe left main and three-vessel coronary artery disease 2.  Previous aneurysmectomy for ruptured aneurysm 3.  Severe peripheral vascular disease with previous aortobifemoral bypass grafting in left Clavien bypass 4.  Chronic kidney disease stage III 5.  Hypertension  Recommendations:  She will be seen by surgery later today.  I'm actually going to keep her nothing by mouth for the time being.  She is alert and although 2883 with a number of other problems is willing to go bypass.  She is high risk however.  Surgeons alerted.     Darden PalmerW. Spencer Climmie Cronce, Jr.  MD [facility] Cardiology  07/11/2017, 9:56 AM

## 2017-07-11 NOTE — Progress Notes (Signed)
Orthopedic Tech Progress Note Patient Details:  Maria ClementMargaret [name] Oct 12, 1932 [id number]  Ortho Devices Type of Ortho Device: Knee Immobilizer Ortho Device/Splint Location: Knee immobilizer supplied at bedside.  Floor nurse notified of drop off.    Alvina ChouWilliams, [name] 07/11/2017, 1:38 AM

## 2017-07-11 NOTE — Progress Notes (Signed)
  Echocardiogram 2D Echocardiogram has been performed.  [name] [name] 07/11/2017, 1:58 PM

## 2017-07-12 ENCOUNTER — Encounter: Payer: Self-pay | Admitting: Pharmacist Clinician (PhC)/ Clinical Pharmacy Specialist

## 2017-07-12 ENCOUNTER — Inpatient Hospital Stay (HOSPITAL_COMMUNITY): Payer: Medicare Other

## 2017-07-12 DIAGNOSIS — I214 Non-ST elevation (NSTEMI) myocardial infarction: Principal | ICD-10-CM

## 2017-07-12 LAB — CBC
HCT: 32.8 % — ABNORMAL LOW (ref 36.0–46.0)
HEMOGLOBIN: 10.7 g/dL — AB (ref 12.0–15.0)
MCH: 30.5 pg (ref 26.0–34.0)
MCHC: 32.6 g/dL (ref 30.0–36.0)
MCV: 93.4 fL (ref 78.0–100.0)
PLATELETS: 231 10*3/uL (ref 150–400)
RBC: 3.51 MIL/uL — AB (ref 3.87–5.11)
RDW: 14.5 % (ref 11.5–15.5)
WBC: 8.8 10*3/uL (ref 4.0–10.5)

## 2017-07-12 LAB — POCT I-STAT 3, ART BLOOD GAS (G3+)
ACID-BASE DEFICIT: 1 mmol/L (ref 0.0–2.0)
BICARBONATE: 23.5 mmol/L (ref 20.0–28.0)
O2 Saturation: 90 %
PH ART: 7.421 (ref 7.350–7.450)
TCO2: 25 mmol/L (ref 0–100)
pCO2 arterial: 35.9 mmHg (ref 32.0–48.0)
pO2, Arterial: 54 mmHg — ABNORMAL LOW (ref 83.0–108.0)

## 2017-07-12 LAB — BASIC METABOLIC PANEL
ANION GAP: 8 (ref 5–15)
BUN: 22 mg/dL — ABNORMAL HIGH (ref 6–20)
CALCIUM: 8.4 mg/dL — AB (ref 8.9–10.3)
CO2: 23 mmol/L (ref 22–32)
CREATININE: 1.36 mg/dL — AB (ref 0.44–1.00)
Chloride: 108 mmol/L (ref 101–111)
GFR, EST AFRICAN AMERICAN: 40 mL/min — AB (ref >60)
GFR, EST NON AFRICAN AMERICAN: 35 mL/min — AB (ref >60)
GLUCOSE: 124 mg/dL — AB (ref 65–99)
Potassium: 3.9 mmol/L (ref 3.5–5.1)
Sodium: 139 mmol/L (ref 135–145)

## 2017-07-12 LAB — POCT ACTIVATED CLOTTING TIME: Activated Clotting Time: 125 seconds

## 2017-07-12 LAB — PROTIME-INR
INR: 1.03
PROTHROMBIN TIME: 13.6 s (ref 11.4–15.2)

## 2017-07-12 LAB — HEPARIN LEVEL (UNFRACTIONATED): HEPARIN UNFRACTIONATED: 0.48 [IU]/mL (ref 0.30–0.70)

## 2017-07-12 MED ORDER — ISOSORBIDE MONONITRATE ER 60 MG PO TB24
60.0000 mg | ORAL_TABLET | Freq: Every day | ORAL | Status: DC
  Administered 2017-07-12 – 2017-07-15 (×4): 60 mg via ORAL
  Filled 2017-07-12 (×4): qty 1

## 2017-07-12 MED ORDER — CLOPIDOGREL BISULFATE 75 MG PO TABS
75.0000 mg | ORAL_TABLET | Freq: Every day | ORAL | Status: DC
  Administered 2017-07-12: 75 mg via ORAL
  Filled 2017-07-12 (×2): qty 1

## 2017-07-12 MED ORDER — WARFARIN - PHARMACIST DOSING INPATIENT
Freq: Every day | Status: DC
  Administered 2017-07-13: 18:00:00

## 2017-07-12 MED ORDER — MORPHINE SULFATE (PF) 4 MG/ML IV SOLN: 2.0000 mg | INTRAVENOUS | Status: DC | PRN

## 2017-07-12 MED ORDER — SODIUM CHLORIDE 0.9 % IV SOLN
INTRAVENOUS | Status: DC
  Administered 2017-07-12: 11:00:00 via INTRAVENOUS

## 2017-07-12 MED ORDER — HEPARIN (PORCINE) IN NACL 100-0.45 UNIT/ML-% IJ SOLN: 700.0000 [IU]/h | INTRAMUSCULAR | Status: DC

## 2017-07-12 MED ORDER — WARFARIN SODIUM 3 MG PO TABS
3.0000 mg | ORAL_TABLET | Freq: Once | ORAL | Status: AC
  Administered 2017-07-12: 3 mg via ORAL
  Filled 2017-07-12: qty 1

## 2017-07-12 NOTE — Progress Notes (Signed)
Subjective:  Patient had minimal chest pain overnight.  Dr. Eldridge DaceVaranasi has seen the patient this morning and has not written a note but we had a long conversation about the case.  He does not feel that pertains to intervention is a good option due to the calcification of the distal vessel and the inability to supply support with an Impella device and possible Rotablator.  I asked him about lesser options such as stenting and balloon without other support while the balloon pump was in but he did not think this was a good option.  His recommendations were to see how she did on medical therapy.  He has discussed this with the patient and the family.  Objective:  Vital Signs in the last 24 hours: BP 97/65   Pulse 88   Temp 98.3 F (36.8 C) (Oral)   Resp 17   Ht 5\' 5"  (1.651 m)   Wt 64.1 kg (141 lb 5 oz)   SpO2 99%   BMI 23.52 kg/m  She is lying in bed currently in no acute distress.  She is quite alert and not complaining of any chest pain.  Physical Exam: Pleasant female in no acute distress Lungs:  Clear Cardiac:  Regular rhythm, normal S1 and S2, no S3, 1/6 systolic murmur Abdomen:  Soft, nontender, no masses Extremities:  Right foot is well perfused, balloon pump is present in the right groin  Intake/Output from previous day: 08/02 0701 - 08/03 0700 In: 550.3 [P.O.:220; I.V.:330.3] Out: 1605 [Urine:1605]  Weight Filed Weights   07/10/17 1257 07/11/17 0500 07/12/17 0500  Weight: 68 kg (150 lb) 66 kg (145 lb 8.1 oz) 64.1 kg (141 lb 5 oz)    Lab Results: Basic Metabolic Panel:  Recent Labs  21/30/8607/01/27 0437 07/12/17 0136  NA 139 139  K 3.9 3.9  CL 111 108  CO2 20* 23  GLUCOSE 98 124*  BUN 22* 22*  CREATININE 1.35* 1.36*   CBC:  Recent Labs  07/10/17 1316 07/11/17 0437 07/12/17 0136  WBC 12.7* 11.6* 8.8  NEUTROABS 11.7*  --   --   HGB 11.2* 10.3* 10.7*  HCT 33.9* 31.6* 32.8*  MCV 93.6 93.2 93.4  PLT 271 266 231   Cardiac Enzymes: Troponin (Point of Care  Test)  Recent Labs  07/10/17 1430  TROPIPOC 0.69*   Cardiac Panel (last 3 results)  Recent Labs  07/10/17 1941 07/11/17 0437 07/11/17 0808  TROPONINI 0.81* 0.94* 1.31*    Telemetry: Sinus rhythm with occasional PVCs  Assessment/Plan:  1.  Non-STEMI with severe left main and three-vessel coronary artery disease 2.  Previous aneurysmectomy for ruptured aneurysm 3.  Severe peripheral vascular disease with previous aortobifemoral bypass grafting and left subclavian bypass  4.  Chronic kidney disease stage III 5.  Hypertension  Recommendations:  Discussion with patient and family as well as other doctors.  At this time she is not a candidate for bypass grafting and the interventional cardiologists do not feel pertains intervention is a good option and high risk for her.  Since they do not feel this is a good option will remove the balloon pump and see how she does.  I asked the interventional cardiologist to comment about whether or not they will consider an emergency intervention in the case of refractory pain and they will let us know later so weak and give guidance to the people covering over the weekend.  The patient's questions were all answered.   Darden PalmerW. Spencer Geoffery Aultman, Jr.  MD  [facility] Cardiology  07/12/2017, 8:58 AM

## 2017-07-12 NOTE — Progress Notes (Addendum)
ANTICOAGULATION CONSULT NOTE - Follow Up Consult  Pharmacy Consult for heparin Indication: IABP  Labs:  Recent Labs  07/10/17 1316 07/10/17 1941 07/11/17 0437 07/11/17 0808 07/11/17 1608 07/12/17 0136  HGB 11.2*  --  10.3*  --   --  10.7*  HCT 33.9*  --  31.6*  --   --  32.8*  PLT 271  --  266  --   --  231  LABPROT 12.9  --  13.8  --   --  13.6  INR 0.98  --  1.06  --   --  1.03  HEPARINUNFRC  --   --   --  0.40 0.47 0.48  CREATININE 1.58*  --  1.35*  --   --  1.36*  TROPONINI  --  0.81* 0.94* 1.31*  --   --     Assessment: 85yoF code STEMI continued on heparin drip post-cath after placement of IABP. 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 IABP pulled today as cardiology is planning no further interventions with sheath removal at noon, no hematoma or oozing noted and CBC remains stable.  Per Dr. Eldridge DaceVaranasi - resume heparin drip 8 hours after IABP sheath pull with no bolus and continue to hold Coumadin for now.  Goal of Therapy:  Heparin level 0.3-0.7 units/ml Monitor platelets by anticoagulation protocol: Yes   Plan:  -Resume heparin at 700 units/hr at 2000 tonight (discussed with RN) -Check an 8-hr heparin level -Monitor heparin level, CBC, S/Sx bleeding daily -Follow-up resuming PTA Coumadin  ADDENDUM: Per Dr. Donnie Ahoilley will resume warfarin without heparin bridge to minimize bleeding risk. INR 1.03 today. PTA warfarin dose = 2mg  daily.  Plan: -Warfarin 3mg  PO x1 tonight -Daily INR -Monitor S/Sx bleeding  Fredonia HighlandMichael Lottie Siska, PharmD PGY-2 Cardiology Pharmacy Resident Pager: [phone]

## 2017-07-12 NOTE — Progress Notes (Signed)
Patient has had the balloon pump taken out and is currently sitting in bed eating breakfast without significant chest pain.  Dr.Varanasi's note read.  I discussed CODE STATUS with patient and family that were present and she would like to be a no CODE BLUE.  She understands that no intervention is planned if she has recurrent events.  We will attempt intensive medical management but she is not to be resuscitated in the event of a recurrent infarct.  Will continue nitroglycerin over the next couple of days and in the interim go ahead and start isosorbide mononitrate to allow this to get on board.  Attempt to wean nitroglycerin over the weekend.  We will restart her warfarin today without a heparin bridge because of the recent balloon pump through the previous graft to try to minimize bleeding risk.  I will add Plavix to the warfarin to try to help management.  Check renal function in morning.  In the event of recurrent chest pain over the weekend this should be treated with intravenous morphine to obtain pain relief and will leave orders.  Per the interventionalists, no return to cath lab in the event of active pain.  I discussed having palliative care coming by and the family did not feel that that was necessary as she wished to return home where she lived previously.  I will be away this weekend and will return Monday and consider discharge at that time.  [facility] will be covering over the weekend.  Darden PalmerW. Spencer Tilley, Jr. MD [facility] 1:35 PM

## 2017-07-12 NOTE — Plan of Care (Signed)
Problem: Cardiovascular: Goal: Vascular access site(s) Level 0-1 will be maintained Outcome: Progressing Right groin site is maintaining level 0-1.

## 2017-07-12 NOTE — Progress Notes (Signed)
ANTICOAGULATION CONSULT NOTE - Follow Up Consult  Pharmacy Consult for heparin Indication: IABP  Labs:  Recent Labs  07/10/17 1316 07/10/17 1941 07/11/17 0437 07/11/17 0808 07/11/17 1608 07/12/17 0136  HGB 11.2*  --  10.3*  --   --  10.7*  HCT 33.9*  --  31.6*  --   --  32.8*  PLT 271  --  266  --   --  231  LABPROT 12.9  --  13.8  --   --  13.6  INR 0.98  --  1.06  --   --  1.03  HEPARINUNFRC  --   --   --  0.40 0.47 0.48  CREATININE 1.58*  --  1.35*  --   --  1.36*  TROPONINI  --  0.81* 0.94* 1.31*  --   --    Medications: Heparin @ 750 units/hr  Assessment: 85yoF s/p cath for STEMI continues on heparin with IABP in place. Not CABG candidate per TCTS, cards planning high risk PCI vs medical management. Heparin level remains therapeutic at 0.48 but at higher range of goal. CBC stable, no S/Sx bleeding or issues with infusion per RN.  Goal of Therapy:  Heparin level 0.2-0.5 units/ml Monitor platelets by anticoagulation protocol: Yes   Plan:  -Reduce heparin slightly to 700 units/hr -Monitor heparin level, CBC, S/Sx bleeding daily -Follow-up resuming PTA Coumadin  Fredonia HighlandMichael Bitonti, PharmD PGY-2 Cardiology Pharmacy Resident Pager: [phone]

## 2017-07-12 NOTE — Progress Notes (Signed)
IABP and sheath removed by [name]. 5732m manual pressure applied. DP +1 post pull. No hematoma or oozing noted upon finishing. Pt reports that the site is very sore to palpation. Some discoloration was noted prior to removal site and did not worsen. Area is soft. Bed rest starts at 1150. Site dressed with 4x4's and tight stretching white tape.

## 2017-07-12 NOTE — Progress Notes (Addendum)
Progress Note  Patient Name: [name] Date of Encounter: 07/12/2017  Primary Cardiologist: Donnie Ahoilley  Subjective   No active chest pain at this time  Inpatient Medications    Scheduled Meds: . amLODipine  10 mg Oral Daily  . aspirin EC  81 mg Oral Daily  . atorvastatin  40 mg Oral Daily  . famotidine  20 mg Oral Daily  . metoprolol tartrate  50 mg Oral BID  . pantoprazole  40 mg Oral Daily  . sodium chloride flush  3 mL Intravenous Q12H  . sodium chloride flush  3 mL Intravenous Q12H   Continuous Infusions: . sodium chloride 100 mL/hr at 07/10/17 1739  . sodium chloride    . heparin 700 Units/hr (07/12/17 0800)  . nitroGLYCERIN 10 mcg/min (07/12/17 0700)   PRN Meds: sodium chloride, acetaminophen, HYDROcodone-acetaminophen, hydrocortisone cream, morphine injection, ondansetron (ZOFRAN) IV, sodium chloride flush, technetium TC 4M diethylenetriame-pentaacetic acid   Vital Signs    Vitals:   07/12/17 0600 07/12/17 0700 07/12/17 0800 07/12/17 0801  BP: (!) 72/62 (!) 75/60 97/67 97/65   Pulse: 88 88    Resp: 16 17    Temp:    98.3 F (36.8 C)  TempSrc:    Oral  SpO2: 95% 96%  99%  Weight:      Height:        Intake/Output Summary (Last 24 hours) at 07/12/17 1024 Last data filed at 07/12/17 0800  Gross per 24 hour  Intake           415.28 ml  Output             1485 ml  Net         -1069.72 ml   Filed Weights   07/10/17 1257 07/11/17 0500 07/12/17 0500  Weight: 150 lb (68 kg) 145 lb 8.1 oz (66 kg) 141 lb 5 oz (64.1 kg)    Telemetry    Sinus rhythm with PVCs - Personally Reviewed  ECG    Sinus rhythm with PVCs, chronic ST segment changes at their baseline levels - Personally Reviewed  Physical Exam   GEN: No acute distress.   Neck: No JVD Cardiac: RRR, no murmurs, rubs, or gallops.  Respiratory: Clear to auscultation bilaterally. GI: Soft, nontender, non-distended  MS: No edema; No deformity. Neuro:  Nonfocal  Psych: Normal affect   Labs      Chemistry Recent Labs Lab 07/10/17 1316 07/11/17 0437 07/12/17 0136  NA 133* 139 139  K 4.5 3.9 3.9  CL 104 111 108  CO2 21* 20* 23  GLUCOSE 127* 98 124*  BUN 25* 22* 22*  CREATININE 1.58* 1.35* 1.36*  CALCIUM 8.2* 8.1* 8.4*  PROT  --  5.0*  --   ALBUMIN  --  2.7*  --   AST  --  17  --   ALT  --  14  --   ALKPHOS  --  53  --   BILITOT  --  0.9  --   GFRNONAA 29* 35* 35*  GFRAA 34* 41* 40*  ANIONGAP 8 8 8      Hematology Recent Labs Lab 07/10/17 1316 07/11/17 0437 07/12/17 0136  WBC 12.7* 11.6* 8.8  RBC 3.62* 3.39* 3.51*  HGB 11.2* 10.3* 10.7*  HCT 33.9* 31.6* 32.8*  MCV 93.6 93.2 93.4  MCH 30.9 30.4 30.5  MCHC 33.0 32.6 32.6  RDW 14.6 14.5 14.5  PLT 271 266 231    Cardiac Enzymes Recent Labs Lab 07/10/17 1941 07/11/17 0437  07/11/17 0808  TROPONINI 0.81* 0.94* 1.31*    Recent Labs Lab 07/10/17 1430  TROPIPOC 0.69*     BNPNo results for input(s): BNP, PROBNP in the last 168 hours.   DDimer  Recent Labs Lab 07/10/17 1428  DDIMER 3.34*     Radiology    Dg Chest 2 View  Result Date: 07/10/2017 CLINICAL DATA:  Chest pain EXAM: CHEST  2 VIEW COMPARISON:  03/09/2014 FINDINGS: The heart is moderately enlarged. Left hemidiaphragm remains elevated and there is subsegmental atelectasis at the left base. Normal pulmonary vascularity. Post operative changes. No pneumothorax. IMPRESSION: Cardiomegaly without edema. Left basilar atelectasis. Electronically Signed   By: Jolaine ClickArthur  Hoss M.D.   On: 07/10/2017 15:10   Ct Head Wo Contrast  Result Date: 07/10/2017 CLINICAL DATA:  Severe headache EXAM: CT HEAD WITHOUT CONTRAST TECHNIQUE: Contiguous axial images were obtained from the base of the skull through the vertex without intravenous contrast. COMPARISON:  03/10/2010 FINDINGS: Brain: Diffuse cerebral atrophy. No acute intracranial abnormality. Specifically, no hemorrhage, hydrocephalus, mass lesion, acute infarction, or significant intracranial injury. Vascular: No  hyperdense vessel or unexpected calcification. Skull: No acute calvarial abnormality. Sinuses/Orbits: Visualized paranasal sinuses and mastoids clear. Orbital soft tissues unremarkable. Other: None IMPRESSION: No acute intracranial abnormality. Electronically Signed   By: Charlett NoseKevin  Dover M.D.   On: 07/10/2017 15:16   Nm Pulmonary Perf And Vent  Result Date: 07/10/2017 CLINICAL DATA:  83 year old female with a history of syncope EXAM: NUCLEAR MEDICINE VENTILATION - PERFUSION LUNG SCAN TECHNIQUE: Ventilation images were obtained in multiple projections using inhaled aerosol Tc-7964m DTPA. Perfusion images were obtained in multiple projections after intravenous injection of Tc-6264m MAA. RADIOPHARMACEUTICALS:  32.0 mCi Technetium-4164m DTPA aerosol inhalation and 4.1 mCi Technetium-8464m MAA IV COMPARISON:  Chest x-ray of the same date FINDINGS: Ventilation: No focal ventilation defect. Perfusion: No wedge shaped peripheral perfusion defects to suggest acute pulmonary embolism. IMPRESSION: Low probability study for pulmonary emboli. Electronically Signed   By: Gilmer MorJaime  Wagner D.O.   On: 07/10/2017 17:45   Dg Chest Port 1 View  Result Date: 07/12/2017 CLINICAL DATA:  Intra artery balloon pump. EXAM: PORTABLE CHEST 1 VIEW COMPARISON:  07/10/2017. FINDINGS: Prior CABG. Heart size normal. Intra-aortic balloon pump marker noted at the level of the aortic arch. Left lower lobe atelectasis and consolidation with small left pleural effusion. Right lung is clear. Peripheral and carotid atherosclerotic vascular calcification . IMPRESSION: 1. Prior CABG. Intra-aortic balloon pump marker noted at the level of the aortic arch. 2. Left lower lobe atelectasis and consolidation with small left pleural effusion . Electronically Signed   By: Maisie Fushomas  Register   On: 07/12/2017 07:27    Cardiac Studies   Cardiac cath results reviewed.  Patient Profile     83 y.o. female who had a non-STEMI and is being treated with supportive care and  IABP due to multivessel calcific coronary artery disease  Assessment & Plan    1) Coronary artery disease: I discussed the case with several interventionalists, including the STEMI doctor for today. We reviewed the films and due to the inability to place an Impella support device, due to her peripheral vascular disease and aortobifemoral bypass, rotational atherectomy with angioplasty and stenting of her ostial common distal left main and ostial circumflex would be very high risk. Given the risks of not surviving the procedure, I would not recommend attempting such an intervention. I discussed this with Dr. Donnie Ahoilley. I suggested a palliative care consult, although he did not feel that in this  case, that would be helpful.  The plan will be to remove the balloon pump. Will increase IV nitroglycerin to help with blood pressure control. I explained to the patient that if she has more discomfort with balloon pump out, she would not be a candidate for cardiac catheterization. This is where palliative care consultation may be helpful, but I will defer to Dr. Donnie Aho. Balloon angioplasty attempt would likely be futile given how calcified her disease is. Her central aortic pressure is very high. Her cuff pressures on the arm do not reflect this due to subclavian peripheral vascular disease. We will aggressively treat pain with narcotics as needed. We'll also continue IV nitroglycerin. Try to transition to isosorbide. Will also consider antiplatelet therapy with Plavix. She was subtherapeutic on her Coumadin.  In addition, her renal dysfunction and age would put her at high risk for contrast-induced nephropathy.  We'll also have to reconsider CODE STATUS. If she were to have a cardiac arrest, I think attempts at resuscitation would be futile as well since there is no way to fix the underlying problem.  Critical care time 60 minutes.    SignedLance Muss, MD  07/12/2017, 10:24 AM

## 2017-07-13 DIAGNOSIS — N179 Acute kidney failure, unspecified: Secondary | ICD-10-CM

## 2017-07-13 DIAGNOSIS — J9811 Atelectasis: Secondary | ICD-10-CM

## 2017-07-13 LAB — BASIC METABOLIC PANEL
ANION GAP: 6 (ref 5–15)
BUN: 26 mg/dL — ABNORMAL HIGH (ref 6–20)
CO2: 24 mmol/L (ref 22–32)
Calcium: 8.2 mg/dL — ABNORMAL LOW (ref 8.9–10.3)
Chloride: 107 mmol/L (ref 101–111)
Creatinine, Ser: 1.6 mg/dL — ABNORMAL HIGH (ref 0.44–1.00)
GFR calc Af Amer: 33 mL/min — ABNORMAL LOW (ref >60)
GFR, EST NON AFRICAN AMERICAN: 29 mL/min — AB (ref >60)
Glucose, Bld: 117 mg/dL — ABNORMAL HIGH (ref 65–99)
POTASSIUM: 4.1 mmol/L (ref 3.5–5.1)
SODIUM: 137 mmol/L (ref 135–145)

## 2017-07-13 LAB — PROTIME-INR
INR: 1.06
PROTHROMBIN TIME: 13.8 s (ref 11.4–15.2)

## 2017-07-13 LAB — CBC
HCT: 30.5 % — ABNORMAL LOW (ref 36.0–46.0)
Hemoglobin: 10.2 g/dL — ABNORMAL LOW (ref 12.0–15.0)
MCH: 30.9 pg (ref 26.0–34.0)
MCHC: 33.4 g/dL (ref 30.0–36.0)
MCV: 92.4 fL (ref 78.0–100.0)
PLATELETS: 210 10*3/uL (ref 150–400)
RBC: 3.3 MIL/uL — AB (ref 3.87–5.11)
RDW: 14.1 % (ref 11.5–15.5)
WBC: 10.6 10*3/uL — AB (ref 4.0–10.5)

## 2017-07-13 LAB — GLUCOSE, CAPILLARY
GLUCOSE-CAPILLARY: 105 mg/dL — AB (ref 65–99)
GLUCOSE-CAPILLARY: 104 mg/dL — AB (ref 65–99)
Glucose-Capillary: 107 mg/dL — ABNORMAL HIGH (ref 65–99)

## 2017-07-13 MED ORDER — HYDROCODONE-ACETAMINOPHEN 5-325 MG PO TABS
1.0000 | ORAL_TABLET | Freq: Four times a day (QID) | ORAL | Status: DC | PRN
  Administered 2017-07-14 – 2017-07-15 (×2): 1 via ORAL
  Filled 2017-07-13 (×2): qty 1

## 2017-07-13 MED ORDER — HEPARIN SODIUM (PORCINE) 5000 UNIT/ML IJ SOLN
5000.0000 [IU] | Freq: Three times a day (TID) | INTRAMUSCULAR | Status: DC
Start: 2017-07-13 — End: 2017-07-15
  Administered 2017-07-13 – 2017-07-15 (×7): 5000 [IU] via SUBCUTANEOUS
  Filled 2017-07-13 (×7): qty 1

## 2017-07-13 MED ORDER — SODIUM CHLORIDE 0.9 % IV SOLN
INTRAVENOUS | Status: AC
  Administered 2017-07-13: 10:00:00 via INTRAVENOUS

## 2017-07-13 MED ORDER — WARFARIN SODIUM 2 MG PO TABS
4.0000 mg | ORAL_TABLET | Freq: Once | ORAL | Status: AC
  Administered 2017-07-13: 4 mg via ORAL
  Filled 2017-07-13: qty 2

## 2017-07-13 MED ORDER — CLOPIDOGREL BISULFATE 75 MG PO TABS
75.0000 mg | ORAL_TABLET | Freq: Every day | ORAL | Status: DC
  Administered 2017-07-13 – 2017-07-15 (×3): 75 mg via ORAL
  Filled 2017-07-13 (×2): qty 1

## 2017-07-13 MED ORDER — CLOPIDOGREL BISULFATE 75 MG PO TABS
150.0000 mg | ORAL_TABLET | Freq: Once | ORAL | Status: AC
  Administered 2017-07-13: 150 mg via ORAL
  Filled 2017-07-13: qty 2

## 2017-07-13 MED ORDER — CLOPIDOGREL BISULFATE 75 MG PO TABS: 150.0000 mg | ORAL_TABLET | Freq: Once | ORAL | Status: DC

## 2017-07-13 NOTE — Progress Notes (Signed)
ANTICOAGULATION CONSULT NOTE - Follow Up Consult  Pharmacy Consult for warfarin Indication: history of blood clots post surgery  Labs:  Recent Labs  07/10/17 1941 07/11/17 0437 07/11/17 0808 07/11/17 1608 07/12/17 0136 07/13/17 0246  HGB  --  10.3*  --   --  10.7* 10.2*  HCT  --  31.6*  --   --  32.8* 30.5*  PLT  --  266  --   --  231 210  LABPROT  --  13.8  --   --  13.6 13.8  INR  --  1.06  --   --  1.03 1.06  HEPARINUNFRC  --   --  0.40 0.47 0.48  --   CREATININE  --  1.35*  --   --  1.36* 1.60*  TROPONINI 0.81* 0.94* 1.31*  --   --   --     Assessment: 85yoF code STEMI continued on heparin drip post-cath after placement of IABP. 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 IABP pulled 8/3 as cardiology is planning no further interventions.  Warfarin restarted last night. INR normal as expected at 1.0 this morning. Will repeat higher warfarin dosing tonight. Note patient being loaded on plavix this morning. In addition, sq heparin added as bridge.  ?if aspirin should be stopped once INR>2.  Goal of Therapy:  INR goal 2-3 Monitor platelets by anticoagulation protocol: Yes   Plan:  -Warfarin 4mg  PO x1 tonight -Daily INR -Monitor S/Sx bleeding  Sheppard CoilFrank Rakesha Dalporto PharmD., BCPS Clinical Pharmacist Pager [phone] 07/13/2017 9:46 AM

## 2017-07-13 NOTE — Progress Notes (Signed)
Progress Note  Patient Name: Maria ClementMargaret [name] Date of Encounter: 07/13/2017  Primary Cardiologist: Donnie Ahoilley  Subjective   No chest pain.  Feels well.  Walked to the bathroom without anginal sx earlier.  Spoke to the nurse.  SHe is on bed rest currently.    Inpatient Medications    Scheduled Meds: . amLODipine  10 mg Oral Daily  . aspirin EC  81 mg Oral Daily  . atorvastatin  40 mg Oral Daily  . clopidogrel  150 mg Oral Once  . clopidogrel  75 mg Oral Daily  . famotidine  20 mg Oral Daily  . heparin subcutaneous  5,000 Units Subcutaneous Q8H  . isosorbide mononitrate  60 mg Oral Daily  . metoprolol tartrate  50 mg Oral BID  . pantoprazole  40 mg Oral Daily  . sodium chloride flush  3 mL Intravenous Q12H  . Warfarin - Pharmacist Dosing Inpatient   Does not apply q1800   Continuous Infusions: . sodium chloride    . sodium chloride 10 mL/hr at 07/12/17 1100  . nitroGLYCERIN 20 mcg/min (07/13/17 0600)   PRN Meds: sodium chloride, acetaminophen, HYDROcodone-acetaminophen, hydrocortisone cream, morphine injection, ondansetron (ZOFRAN) IV, sodium chloride flush, technetium TC 55M diethylenetriame-pentaacetic acid   Vital Signs    Vitals:   07/13/17 0411 07/13/17 0500 07/13/17 0600 07/13/17 0700  BP:  (!) 104/52 108/62 (!) 87/57  Pulse:  81 92 85  Resp:  15 16 20   Temp: 98.2 F (36.8 C)     TempSrc: Oral     SpO2:  91% 94% 94%  Weight:   140 lb 3.4 oz (63.6 kg)   Height:        Intake/Output Summary (Last 24 hours) at 07/13/17 0747 Last data filed at 07/13/17 0600  Gross per 24 hour  Intake             1282 ml  Output             1515 ml  Net             -233 ml   Filed Weights   07/11/17 0500 07/12/17 0500 07/13/17 0600  Weight: 145 lb 8.1 oz (66 kg) 141 lb 5 oz (64.1 kg) 140 lb 3.4 oz (63.6 kg)    Telemetry    NSR - Personally Reviewed  ECG    None recently  Physical Exam   GEN: No acute distress.   Neck: No JVD Cardiac: RRR, 2/6 systolic murmur;, no  rubs, or gallops.  Respiratory: Clear to auscultation bilaterally. GI: Soft, nontender, non-distended  MS: No edema; No deformity. Right groin bruised.  3+ right PT pulse, diminished left PT pulse but left foot is warm Neuro:  Nonfocal  Psych: Normal affect   Labs    Chemistry Recent Labs Lab 07/11/17 0437 07/12/17 0136 07/13/17 0246  NA 139 139 137  K 3.9 3.9 4.1  CL 111 108 107  CO2 20* 23 24  GLUCOSE 98 124* 117*  BUN 22* 22* 26*  CREATININE 1.35* 1.36* 1.60*  CALCIUM 8.1* 8.4* 8.2*  PROT 5.0*  --   --   ALBUMIN 2.7*  --   --   AST 17  --   --   ALT 14  --   --   ALKPHOS 53  --   --   BILITOT 0.9  --   --   GFRNONAA 35* 35* 29*  GFRAA 41* 40* 33*  ANIONGAP 8 8 6  Hematology Recent Labs Lab 07/11/17 0437 07/12/17 0136 07/13/17 0246  WBC 11.6* 8.8 10.6*  RBC 3.39* 3.51* 3.30*  HGB 10.3* 10.7* 10.2*  HCT 31.6* 32.8* 30.5*  MCV 93.2 93.4 92.4  MCH 30.4 30.5 30.9  MCHC 32.6 32.6 33.4  RDW 14.5 14.5 14.1  PLT 266 231 210    Cardiac Enzymes Recent Labs Lab 07/10/17 1941 07/11/17 0437 07/11/17 0808  TROPONINI 0.81* 0.94* 1.31*    Recent Labs Lab 07/10/17 1430  TROPIPOC 0.69*     BNPNo results for input(s): BNP, PROBNP in the last 168 hours.   DDimer  Recent Labs Lab 07/10/17 1428  DDIMER 3.34*     Radiology    Dg Chest Port 1 View  Result Date: 07/12/2017 CLINICAL DATA:  Intra artery balloon pump. EXAM: PORTABLE CHEST 1 VIEW COMPARISON:  07/10/2017. FINDINGS: Prior CABG. Heart size normal. Intra-aortic balloon pump marker noted at the level of the aortic arch. Left lower lobe atelectasis and consolidation with small left pleural effusion. Right lung is clear. Peripheral and carotid atherosclerotic vascular calcification . IMPRESSION: 1. Prior CABG. Intra-aortic balloon pump marker noted at the level of the aortic arch. 2. Left lower lobe atelectasis and consolidation with small left pleural effusion . Electronically Signed   By: Maisie Fushomas   Register   On: 07/12/2017 07:27    Cardiac Studies   Cath showing severe multivessel calcific disease  Patient Profile     83 y.o. female with NSTEMI in the setting of severe CAD  Assessment & Plan    1) CAD: Give extra 150 mg of Plavix today in addition to the 75 mg dose to get her therapeutic today.  Continue IMdur.  Plan to wean the IV NTG as tolerated.  Of note, her arm cuff pressure is much lower than her central aortic pressure.  Watch for symptoms associated with low BP readings before stopping NTG or other BP lowering meds.  Continue the plan to wean IV NTG as tolerated.  Imdur was started yesterday.    2) SQ Heparin for DVT prophylaxis ordered today.  3) OOB with assist.  Would like her to sit in the chair as well.    4) ARF: Cr increased today from baseline.  Avoid nephrotoxins.  Of note, LVEDP was not significantly elevated at cath so she would tolerate some IV fluid.    5) 8/3 chest xray showed atelectasis.  Incentive spirometer to bedside.   Signed, Lance MussJayadeep Tylique Aull, MD  07/13/2017, 7:47 AM

## 2017-07-14 DIAGNOSIS — I119 Hypertensive heart disease without heart failure: Secondary | ICD-10-CM

## 2017-07-14 LAB — PROTIME-INR
INR: 1.03
PROTHROMBIN TIME: 13.6 s (ref 11.4–15.2)

## 2017-07-14 LAB — BASIC METABOLIC PANEL
ANION GAP: 8 (ref 5–15)
BUN: 26 mg/dL — ABNORMAL HIGH (ref 6–20)
CALCIUM: 8.6 mg/dL — AB (ref 8.9–10.3)
CO2: 22 mmol/L (ref 22–32)
CREATININE: 1.59 mg/dL — AB (ref 0.44–1.00)
Chloride: 107 mmol/L (ref 101–111)
GFR, EST AFRICAN AMERICAN: 34 mL/min — AB (ref >60)
GFR, EST NON AFRICAN AMERICAN: 29 mL/min — AB (ref >60)
Glucose, Bld: 109 mg/dL — ABNORMAL HIGH (ref 65–99)
Potassium: 3.9 mmol/L (ref 3.5–5.1)
SODIUM: 137 mmol/L (ref 135–145)

## 2017-07-14 LAB — CBC
HEMATOCRIT: 31.6 % — AB (ref 36.0–46.0)
Hemoglobin: 10.4 g/dL — ABNORMAL LOW (ref 12.0–15.0)
MCH: 30 pg (ref 26.0–34.0)
MCHC: 32.9 g/dL (ref 30.0–36.0)
MCV: 91.1 fL (ref 78.0–100.0)
PLATELETS: 259 10*3/uL (ref 150–400)
RBC: 3.47 MIL/uL — ABNORMAL LOW (ref 3.87–5.11)
RDW: 14 % (ref 11.5–15.5)
WBC: 10.1 10*3/uL (ref 4.0–10.5)

## 2017-07-14 LAB — GLUCOSE, CAPILLARY: GLUCOSE-CAPILLARY: 146 mg/dL — AB (ref 65–99)

## 2017-07-14 MED ORDER — WARFARIN SODIUM 5 MG PO TABS
5.0000 mg | ORAL_TABLET | Freq: Once | ORAL | Status: AC
  Administered 2017-07-14: 5 mg via ORAL
  Filled 2017-07-14: qty 1

## 2017-07-14 MED ORDER — METOPROLOL TARTRATE 50 MG PO TABS
100.0000 mg | ORAL_TABLET | Freq: Two times a day (BID) | ORAL | Status: DC
  Administered 2017-07-14 – 2017-07-15 (×3): 100 mg via ORAL
  Filled 2017-07-14 (×3): qty 2

## 2017-07-14 NOTE — Progress Notes (Signed)
ANTICOAGULATION CONSULT NOTE - Follow Up Consult  Pharmacy Consult for warfarin Indication: history of blood clots post surgery  Labs:  Recent Labs  07/11/17 1608  07/12/17 0136 07/13/17 0246 07/14/17 0204  HGB  --   < > 10.7* 10.2* 10.4*  HCT  --   --  32.8* 30.5* 31.6*  PLT  --   --  231 210 259  LABPROT  --   --  13.6 13.8 13.6  INR  --   --  1.03 1.06 1.03  HEPARINUNFRC 0.47  --  0.48  --   --   CREATININE  --   --  1.36* 1.60* 1.59*  < > = values in this interval not displayed.  Assessment: 85yoF code STEMI continued on heparin drip post-cath after placement of IABP. 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 IABP pulled 8/3 as cardiology is planning no further interventions.  Warfarin restarted 8/3. INR normal at 1.0 this morning. Will repeat higher warfarin dosing tonight. Note patient loaded on plavix 8/4. In addition, sq heparin added as bridge.  ?if aspirin should be stopped once INR>2.  Goal of Therapy:  INR goal 2-3 Monitor platelets by anticoagulation protocol: Yes   Plan:  -Warfarin 5mg  PO x1 tonight -Daily INR -Monitor S/Sx bleeding  Sheppard CoilFrank Joahan Swatzell PharmD., BCPS Clinical Pharmacist Pager [phone] 07/14/2017 10:36 AM

## 2017-07-14 NOTE — Progress Notes (Addendum)
Progress Note  Patient Name: [name] Date of Encounter: 07/14/2017  PrimarGordy Clementy Cardiologist: Dr. Donnie AhoIlley  Subjective   No chest pain.  She does not remember sitting in the bed yesterday, but the nurse states she did.  She did well with that. No groin issues.  Inpatient Medications    Scheduled Meds: . amLODipine  10 mg Oral Daily  . aspirin EC  81 mg Oral Daily  . atorvastatin  40 mg Oral Daily  . clopidogrel  75 mg Oral Daily  . famotidine  20 mg Oral Daily  . heparin subcutaneous  5,000 Units Subcutaneous Q8H  . isosorbide mononitrate  60 mg Oral Daily  . metoprolol tartrate  100 mg Oral BID  . pantoprazole  40 mg Oral Daily  . sodium chloride flush  3 mL Intravenous Q12H  . Warfarin - Pharmacist Dosing Inpatient   Does not apply q1800   Continuous Infusions: . sodium chloride    . sodium chloride 10 mL/hr at 07/14/17 0400  . nitroGLYCERIN 15 mcg/min (07/14/17 0400)   PRN Meds: sodium chloride, acetaminophen, HYDROcodone-acetaminophen, hydrocortisone cream, morphine injection, ondansetron (ZOFRAN) IV, sodium chloride flush   Vital Signs    Vitals:   07/14/17 0400 07/14/17 0700 07/14/17 0800 07/14/17 0815  BP: 105/61 110/63 111/68 124/61  Pulse: 73 76 75 75  Resp: 18 (!) 22 15 20   Temp:  98.3 F (36.8 C)    TempSrc:  Oral    SpO2: 95% 94% 97% 95%  Weight:      Height:        Intake/Output Summary (Last 24 hours) at 07/14/17 0935 Last data filed at 07/14/17 0400  Gross per 24 hour  Intake           724.88 ml  Output              250 ml  Net           474.88 ml   Filed Weights   07/12/17 0500 07/13/17 0600 07/14/17 0200  Weight: 141 lb 5 oz (64.1 kg) 140 lb 3.4 oz (63.6 kg) 142 lb 14.4 oz (64.8 kg)    Telemetry    NSR- Personally Reviewed  ECG    None recent  Physical Exam   GEN: No acute distress.   Neck: No JVD Cardiac: RRR, no murmurs, rubs, or gallops.  Respiratory: Clear to auscultation bilaterally. GI: Soft, nontender, non-distended    MS: No edema; No deformity. Neuro:  Nonfocal  Psych: Normal affect   Labs    Chemistry Recent Labs Lab 07/11/17 0437 07/12/17 0136 07/13/17 0246 07/14/17 0204  NA 139 139 137 137  K 3.9 3.9 4.1 3.9  CL 111 108 107 107  CO2 20* 23 24 22   GLUCOSE 98 124* 117* 109*  BUN 22* 22* 26* 26*  CREATININE 1.35* 1.36* 1.60* 1.59*  CALCIUM 8.1* 8.4* 8.2* 8.6*  PROT 5.0*  --   --   --   ALBUMIN 2.7*  --   --   --   AST 17  --   --   --   ALT 14  --   --   --   ALKPHOS 53  --   --   --   BILITOT 0.9  --   --   --   GFRNONAA 35* 35* 29* 29*  GFRAA 41* 40* 33* 34*  ANIONGAP 8 8 6 8      Hematology Recent Labs Lab 07/12/17 0136 07/13/17 0246 07/14/17 [id number]  WBC 8.8 10.6* 10.1  RBC 3.51* 3.30* 3.47*  HGB 10.7* 10.2* 10.4*  HCT 32.8* 30.5* 31.6*  MCV 93.4 92.4 91.1  MCH 30.5 30.9 30.0  MCHC 32.6 33.4 32.9  RDW 14.5 14.1 14.0  PLT 231 210 259    Cardiac Enzymes Recent Labs Lab 07/10/17 1941 07/11/17 0437 07/11/17 0808  TROPONINI 0.81* 0.94* 1.31*    Recent Labs Lab 07/10/17 1430  TROPIPOC 0.69*     BNPNo results for input(s): BNP, PROBNP in the last 168 hours.   DDimer  Recent Labs Lab 07/10/17 1428  DDIMER 3.34*     Radiology    No results found.  Cardiac Studies   Cath showing severe calcific 3 vessel disease  Patient Profile     83 y.o. female with Multivessel CAD.  Assessment & Plan    1) CAD/MI: Continue medical therapy. Increase metoprolol to 100 mg twice a day. Continue isosorbide. They're weaning IV nitroglycerin. She is tolerating the medical regimen well.  2) continue to encourage getting her out of bed.  3) I again discussed with the nurse regarding her difference in cuff pressure versus central aortic pressure. There is a large gradient. Left arm pressure will be more accurate as her circulation to her radial artery seems better in that arm. There is still likely some gradient. Watch for symptoms before believing hypotension based on  arm cuff reading.  4) loaded with Plavix. Warfarin continuing, followed by pharmacy.  5) CRF: stable at this time. Cr unchaged from yesterday after 500 cc of IV fluids.  Signed, Lance MussJayadeep Rajni Holsworth, MD  07/14/2017, 9:35 AM

## 2017-07-15 ENCOUNTER — Ambulatory Visit: Payer: Medicare Other | Admitting: Family Medicine

## 2017-07-15 LAB — CBC
HCT: 36 % (ref 36.0–46.0)
HEMOGLOBIN: 11.7 g/dL — AB (ref 12.0–15.0)
MCH: 30.5 pg (ref 26.0–34.0)
MCHC: 32.5 g/dL (ref 30.0–36.0)
MCV: 93.8 fL (ref 78.0–100.0)
PLATELETS: 299 10*3/uL (ref 150–400)
RBC: 3.84 MIL/uL — ABNORMAL LOW (ref 3.87–5.11)
RDW: 14.3 % (ref 11.5–15.5)
WBC: 11.5 10*3/uL — ABNORMAL HIGH (ref 4.0–10.5)

## 2017-07-15 LAB — BASIC METABOLIC PANEL
Anion gap: 10 (ref 5–15)
BUN: 24 mg/dL — ABNORMAL HIGH (ref 6–20)
CALCIUM: 8.5 mg/dL — AB (ref 8.9–10.3)
CHLORIDE: 104 mmol/L (ref 101–111)
CO2: 23 mmol/L (ref 22–32)
CREATININE: 1.66 mg/dL — AB (ref 0.44–1.00)
GFR, EST AFRICAN AMERICAN: 32 mL/min — AB (ref >60)
GFR, EST NON AFRICAN AMERICAN: 27 mL/min — AB (ref >60)
Glucose, Bld: 87 mg/dL (ref 65–99)
Potassium: 3.7 mmol/L (ref 3.5–5.1)
SODIUM: 137 mmol/L (ref 135–145)

## 2017-07-15 LAB — GLUCOSE, CAPILLARY
GLUCOSE-CAPILLARY: 111 mg/dL — AB (ref 65–99)
Glucose-Capillary: 106 mg/dL — ABNORMAL HIGH (ref 65–99)

## 2017-07-15 LAB — PROTIME-INR
INR: 1.11
Prothrombin Time: 14.4 seconds (ref 11.4–15.2)

## 2017-07-15 MED ORDER — AMLODIPINE BESYLATE 10 MG PO TABS
10.0000 mg | ORAL_TABLET | Freq: Every day | ORAL | 12 refills | Status: AC
Start: 2017-07-16 — End: ?

## 2017-07-15 MED ORDER — ISOSORBIDE MONONITRATE ER 60 MG PO TB24: 60.0000 mg | ORAL_TABLET | Freq: Every day | ORAL | 12 refills | Status: AC

## 2017-07-15 MED ORDER — CLOPIDOGREL BISULFATE 75 MG PO TABS
75.0000 mg | ORAL_TABLET | Freq: Every day | ORAL | 12 refills | Status: AC
Start: 2017-07-16 — End: ?

## 2017-07-15 MED ORDER — METOPROLOL TARTRATE 100 MG PO TABS: 100.0000 mg | ORAL_TABLET | Freq: Two times a day (BID) | ORAL | 12 refills | Status: AC

## 2017-07-15 MED ORDER — WARFARIN SODIUM 5 MG PO TABS: 5.0000 mg | ORAL_TABLET | Freq: Once | ORAL | Status: DC

## 2017-07-15 NOTE — Progress Notes (Signed)
CARDIAC REHAB PHASE I   PRE:  Rate/Rhythm: 65 SR    BP: sitting 117/56    SaO2: 96 RA  MODE:  Ambulation: 370 ft   POST:  Rate/Rhythm: 76 SR    BP: sitting 133/58     SaO2: 98 RA  Pt looking and feeling well. Able to walk with gait belt, no assist needed (x2 for supervision). Denied angina, getting tired toward end. Return to recliner. Discussed MI and NTG. Voiced understanding although she does have memory issues so reiteration of NTG would be good. Her niece helps her with meds, etc. She spends the night with her niece as well then stays at her house during the day. N/a for CRPII. [id number]   Maria MassonRandi Kristan Gavyn [name] CES, ACSM 07/15/2017 10:40 AM

## 2017-07-15 NOTE — Progress Notes (Signed)
Subjective:  No chest pain over the weekend.  Wants to go home.   Objective:  Vital Signs in the last 24 hours: BP (!) 140/91   Pulse 60   Temp (!) 97.3 F (36.3 C) (Oral)   Resp (!) 0   Ht 5\' 5"  (1.651 m)   Wt 65 kg (143 lb 4.8 oz)   SpO2 94%   BMI 23.85 kg/m  She is lying in bed currently in no acute distress.  She is quite alert and not complaining of any chest pain.  Physical Exam: Pleasant female in no acute distress Lungs:  Clear Cardiac:  Regular rhythm, normal S1 and S2, no S3, 1/6 systolic murmur Abdomen:  Soft, nontender, no masses Extremities:  No edema.   Intake/Output from previous day: 08/05 0701 - 08/06 0700 In: 819 [P.O.:780; I.V.:39] Out: 1101 [Urine:1100; Stool:1]  Weight Filed Weights   07/13/17 0600 07/14/17 0200 07/15/17 0500  Weight: 63.6 kg (140 lb 3.4 oz) 64.8 kg (142 lb 14.4 oz) 65 kg (143 lb 4.8 oz)    Lab Results: Basic Metabolic Panel:  Recent Labs  84/16/6007/04/27 0204 07/15/17 0151  NA 137 137  K 3.9 3.7  CL 107 104  CO2 22 23  GLUCOSE 109* 87  BUN 26* 24*  CREATININE 1.59* 1.66*   CBC:  Recent Labs  07/14/17 0204 07/15/17 0151  WBC 10.1 11.5*  HGB 10.4* 11.7*  HCT 31.6* 36.0  MCV 91.1 93.8  PLT 259 299   Telemetry: Sinus rhythm with occasional PVCs  Assessment/Plan:  1.  Non-STEMI with severe left main and three-vessel coronary artery disease not candidate for intervention 2.  Previous aneurysmectomy for ruptured aneurysm 3.  Severe peripheral vascular disease with previous aortobifemoral bypass grafting and left subclavian bypass  4.  Chronic kidney disease stage III 5.  Hypertension  Recommendations:  She wants to go home.  Will arrange for Home Health to see and for her to walk in hall.  If stable hopefully home later today.   Darden PalmerW. Spencer Vernis Eid, Jr.  MD [facility] Cardiology  07/15/2017, 9:34 AM

## 2017-07-15 NOTE — Care Management Note (Signed)
Case Management Note  Patient Details  Name: [name] MRN: [id number] Date of Birth: [birth year]  Subjective/Objective:     From home alone, but stays with her niece , Vickie [phone] at night she also has a sister who is  Supportive as well.  She presents with Non-STEMI with severe left main and three-vessel coronary artery disease s/p IABP insertion.  Surgery will see her later today about poss CABG.   8/3 1117 [name] RN, BSN - per Cards note will not be able to do Impella support device, due to her peripheral vascular disease and aortobifemoral bypass would be very high risk of not surviving the procedure. Plan to remove balloon pump. If she has more discomfort with balloon pump out she would not be a candidate for cardiac cath per Cards note.  Plan to treat pian with narcotics and cont iv ntg, will try to transition to isosorbide and antiplatelet (plavix).  8/6 1127 [name] RN, BSN - NCM offered choice to patient with [facility], [facility], [facility] Lupita LeashDonna , she will also need med management..  [facility]  She is for dc today.                 Action/Plan: NCM will follow for dc needs.   Expected Discharge Date:                  Expected Discharge Plan:  Home w Home Health Services  In-House Referral:     Discharge planning Services  CM Consult  Post Acute Care Choice:  NA Choice offered to:  Patient  DME Arranged:  N/A DME Agency:  NA  HH Arranged:  RN HH Agency:  [facility]  Status of Service:  Completed, signed off  If discussed at Long Length of Stay Meetings, dates discussed:    Additional Comments:  Leone Havenaylor, Hesston Hitchens [place], RN 07/15/2017, 11:27 AM

## 2017-07-15 NOTE — Progress Notes (Signed)
ANTICOAGULATION CONSULT NOTE - Follow Up Consult  Pharmacy Consult for warfarin Indication: history of blood clots post surgery  Labs:  Recent Labs  07/13/17 0246 07/14/17 0204 07/15/17 0151  HGB 10.2* 10.4* 11.7*  HCT 30.5* 31.6* 36.0  PLT 210 259 299  LABPROT 13.8 13.6 14.4  INR 1.06 1.03 1.11  CREATININE 1.60* 1.59* 1.66*    Assessment: 85yoF code STEMI continued on heparin drip post-cath after placement of IABP. 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 IABP pulled 8/3 as cardiology is planning no further interventions.  Warfarin restarted 8/3. INR remains subtherapeutic at 1.11 despite boosted doses but is now trending up slightly.  Goal of Therapy:  INR goal 2-3 Monitor platelets by anticoagulation protocol: Yes   Plan:  -Warfarin 5mg  PO x1 again tonight -Daily INR -Monitor S/Sx bleeding  Fredonia HighlandMichael Bitonti, PharmD PGY-2 Cardiology Pharmacy Resident Pager: [phone] 07/15/2017

## 2017-07-15 NOTE — Discharge Instructions (Signed)
May use hydrocodone if needed for severe pain not relieved with nitroglycerin.  There has been a number of changes in your medication.  Take your Coumadin 2 mg daily until you were seen in the office next Monday at which point we will recheck it.

## 2017-07-15 NOTE — Progress Notes (Signed)
Discharge instructions given and explained. Answered pt's and pt's niece's questions and they verbalized understanding. IV removed and tolerated well. Pt disconnected from monitor. No s/s of distress at this time. Pt taken down to lobby via wheelchair by volunteer for discharge.

## 2017-07-15 NOTE — Discharge Summary (Addendum)
Physician Discharge Summary  Patient ID: [name] MRN: [id number] DOB/AGE: 1932-10-01 83 y.o.  Admit date: 07/10/2017 Discharge date: 07/15/2017  Primary Physician:  Dr. Vernon Preyon Moore   Primary Discharge Diagnosis:  1.  Non-STEMI  Secondary Discharge Diagnosis: 2.  Severe coronary artery disease with left main disease, severe disease of the ostial circumflex and mid LAD 3.  Prior history of LV aneurysm with resection 4.  Prior history of stroke 5.  Hypertensive heart disease 6.  Severe peripheral vascular disease  Procedures:  Echocardiogram, cardiac catheterization, insertion of an intraaortic balloon pump  Consults:  Dr. Tressie Stalkerlarence Owen, interventional cardiology Dr. Alanda SlimVaranasi  Hospital Course: This 83 year old female has a history of severe coronary disease and peripheral vascular disease.  She had rupture of an anterior infarction in 1999 with anemia was ectomy but did not have to have bypass.  She had done remarkably well through the years and had severe hypertension as well as severe peripheral vascular disease with multiple interventions previously.  She presented to the hospital with chest discomfort and some presyncope and had admission for unstable angina.  Troponin elevation was mild.  She was taken to the catheterization laboratory later on the day of admission with findings of moderately severe left main disease, severe ostial circumflex disease that was calcified, severe mid LAD disease and mild disease of the right coronary artery.  She had insertion of an intra-aortic balloon pump and was asked to be considered for surgery.  She remained stable on the balloon pump and had only mild elevation of her troponin.  EKG improved and she was asymptomatic the next day.  She was seen in consultation by Dr. Cornelius Moraswen who found her to not be a surgical candidate at this time.  Interventional cardiology was consulted to determine if she would be a candidate for high risk percutaneous intervention.   She was seen while she had the balloon pump and was not deemed to be a candidate for intervention due to the calcified nature of the lesion and felt to be too high risk for this and it was recommended that she just be treated medically either to allow to have a controlled infarction or management of pain control.  The intra-aortic balloon pump was able to be removed after 2 days and she was placed on Imdur.  She was treated with intravenous nitroglycerin and this was able to be discontinued.  She was started back on Coumadin and Plavix was added to her regimen.  She was able to be ambulatory in the hall without recurrent chest discomfort and able to be discharged home.  After consultation with interventional cardiology if she returns to the hospital with an acute infarction she should be treated medically and not taken to the cath lab because of the high likelihood of unsuccessful nature of an acute procedure.  Arrangements are made for home health care and she will be given a prescription for hydrocodone.  She did not wish to be considered for hospice at this time.  Arrangements for home health were made and she had medication adjustments with the increase in beta blocker as well as amlodipine.  It should be noted that her central blood pressures run 30-40 points higher than her peripheral blood pressures.  Echocardiogram done while she was in the hospital showed mild-to-moderate aortic stenosis and normal LV function.  He did have elevation of creatinine when she was in the hospital and her spironolactone was discontinued.  She was started back on warfarin and this will  be allowed to drift up as an outpatient in conjunction with her Coumadin.  Discharge Exam: Blood pressure (!) 124/59, pulse 68, temperature 98.4 F (36.9 C), temperature source Oral, resp. rate (!) 23, height 5\' 5"  (1.651 m), weight 65 kg (143 lb 4.8 oz), SpO2 99 %. Weight: 65 kg (143 lb 4.8 oz) Elderly female in no acute distress, lungs  clear Labs: CBC:   Lab Results  Component Value Date   WBC 11.5 (H) 07/15/2017   HGB 11.7 (L) 07/15/2017   HCT 36.0 07/15/2017   MCV 93.8 07/15/2017   PLT 299 07/15/2017    CMP:  Recent Labs Lab 07/11/17 0437  07/15/17 0151  NA 139  < > 137  K 3.9  < > 3.7  CL 111  < > 104  CO2 20*  < > 23  BUN 22*  < > 24*  CREATININE 1.35*  < > 1.66*  CALCIUM 8.1*  < > 8.5*  PROT 5.0*  --   --   BILITOT 0.9  --   --   ALKPHOS 53  --   --   ALT 14  --   --   AST 17  --   --   GLUCOSE 98  < > 87  < > = values in this interval not displayed.  Lipid Panel     Component Value Date/Time   CHOL 153 02/08/2017 1219   TRIG 163 (H) 02/08/2017 1219   TRIG 202 (H) 01/10/2015 1025   HDL 42 02/08/2017 1219   HDL 45 01/10/2015 1025   CHOLHDL 3.6 02/08/2017 1219   LDLCALC 78 02/08/2017 1219   LDLCALC 40 01/11/2014 1537   Thyroid: Lab Results  Component Value Date   TSH 2.010 06/17/2017   T4TOTAL 7.4 06/17/2017   Radiology: Cardiomegaly without edema  EKG: T wave inversions consistent with ischemia or LVH.  EKG during chest pain shows significant ST depression across the precordial leads.  Discharge Medications: Allergies as of 07/15/2017      Reactions   Alendronate Sodium Other (See Comments)   Irritates esophagus and ulcers   Buffered Aspirin Other (See Comments)   On blood thinners but doctor has started aspirin as stroke therapy   Ibuprofen Other (See Comments)   Blood thinner therapy   Prednisone Other (See Comments)   Patient states due to osteoporosis Ok if not long term      Medication List    STOP taking these medications   predniSONE 20 MG tablet Commonly known as:  DELTASONE   ranitidine 150 MG tablet Commonly known as:  ZANTAC   spironolactone 50 MG tablet Commonly known as:  ALDACTONE     TAKE these medications   amLODipine 10 MG tablet Commonly known as:  NORVASC Take 1 tablet (10 mg total) by mouth daily. What changed:  medication  strength  how much to take   atorvastatin 40 MG tablet Commonly known as:  LIPITOR Take 1 tablet (40 mg total) by mouth daily.   clopidogrel 75 MG tablet Commonly known as:  PLAVIX Take 1 tablet (75 mg total) by mouth daily.   HYDROcodone-acetaminophen 5-325 MG tablet Commonly known as:  NORCO/VICODIN Take 1 tablet by mouth every 6 (six) hours as needed. For pain   isosorbide mononitrate 60 MG 24 hr tablet Commonly known as:  IMDUR Take 1 tablet (60 mg total) by mouth daily.   metoprolol tartrate 100 MG tablet Commonly known as:  LOPRESSOR Take 1 tablet (100 mg total) by  mouth 2 (two) times daily. What changed:  medication strength  how much to take   pantoprazole 40 MG tablet Commonly known as:  PROTONIX Take 1 tablet (40 mg total) by mouth daily. What changed:  when to take this   triamterene-hydrochlorothiazide 37.5-25 MG tablet Commonly known as:  MAXZIDE-25 Take 1 tablet by mouth daily.   warfarin 1 MG tablet Commonly known as:  COUMADIN Take 1-2 tablets (1-2 mg total) by mouth daily.       Followup plans and appointments: Follow-up with Dr. Donnie Aho in one week with a pro time.  She is also to be seen by home health  Time spent with patient to include physician time:  45 minutes   Signed: W. Ashley Royalty. MD [facility] 07/15/2017, 1:41 PM

## 2017-07-16 ENCOUNTER — Telehealth: Payer: Self-pay | Admitting: Nurse Practitioner

## 2017-07-16 DIAGNOSIS — Z87891 Personal history of nicotine dependence: Secondary | ICD-10-CM | POA: Diagnosis not present

## 2017-07-16 DIAGNOSIS — I1310 Hypertensive heart and chronic kidney disease without heart failure, with stage 1 through stage 4 chronic kidney disease, or unspecified chronic kidney disease: Secondary | ICD-10-CM | POA: Diagnosis not present

## 2017-07-16 DIAGNOSIS — H5461 Unqualified visual loss, right eye, normal vision left eye: Secondary | ICD-10-CM | POA: Diagnosis not present

## 2017-07-16 DIAGNOSIS — I739 Peripheral vascular disease, unspecified: Secondary | ICD-10-CM | POA: Diagnosis not present

## 2017-07-16 DIAGNOSIS — I214 Non-ST elevation (NSTEMI) myocardial infarction: Secondary | ICD-10-CM | POA: Diagnosis not present

## 2017-07-16 DIAGNOSIS — I2584 Coronary atherosclerosis due to calcified coronary lesion: Secondary | ICD-10-CM | POA: Diagnosis not present

## 2017-07-16 DIAGNOSIS — N184 Chronic kidney disease, stage 4 (severe): Secondary | ICD-10-CM | POA: Diagnosis not present

## 2017-07-16 DIAGNOSIS — Z7901 Long term (current) use of anticoagulants: Secondary | ICD-10-CM | POA: Diagnosis not present

## 2017-07-16 DIAGNOSIS — I2510 Atherosclerotic heart disease of native coronary artery without angina pectoris: Secondary | ICD-10-CM | POA: Diagnosis not present

## 2017-07-16 DIAGNOSIS — I69398 Other sequelae of cerebral infarction: Secondary | ICD-10-CM | POA: Diagnosis not present

## 2017-07-16 NOTE — Telephone Encounter (Signed)
Spoke with [name] regarding Nephrology referral Explained it is due to CKD Pt had recent hospitalization but will call to reschedule

## 2017-07-18 ENCOUNTER — Telehealth: Payer: Self-pay | Admitting: Pharmacist

## 2017-07-18 ENCOUNTER — Ambulatory Visit (HOSPITAL_COMMUNITY): Admit: 2017-07-18 | Payer: Medicare Other | Admitting: Cardiology

## 2017-07-18 ENCOUNTER — Emergency Department (HOSPITAL_COMMUNITY): Payer: Medicare Other

## 2017-07-18 ENCOUNTER — Encounter (HOSPITAL_COMMUNITY): Payer: Self-pay | Admitting: Emergency Medicine

## 2017-07-18 DIAGNOSIS — I350 Nonrheumatic aortic (valve) stenosis: Secondary | ICD-10-CM | POA: Diagnosis not present

## 2017-07-18 DIAGNOSIS — Z515 Encounter for palliative care: Secondary | ICD-10-CM | POA: Diagnosis not present

## 2017-07-18 DIAGNOSIS — K219 Gastro-esophageal reflux disease without esophagitis: Secondary | ICD-10-CM | POA: Diagnosis present

## 2017-07-18 DIAGNOSIS — I69398 Other sequelae of cerebral infarction: Secondary | ICD-10-CM

## 2017-07-18 DIAGNOSIS — H5461 Unqualified visual loss, right eye, normal vision left eye: Secondary | ICD-10-CM | POA: Diagnosis present

## 2017-07-18 DIAGNOSIS — Z951 Presence of aortocoronary bypass graft: Secondary | ICD-10-CM

## 2017-07-18 DIAGNOSIS — Z66 Do not resuscitate: Secondary | ICD-10-CM | POA: Diagnosis present

## 2017-07-18 DIAGNOSIS — I129 Hypertensive chronic kidney disease with stage 1 through stage 4 chronic kidney disease, or unspecified chronic kidney disease: Secondary | ICD-10-CM | POA: Diagnosis present

## 2017-07-18 DIAGNOSIS — I739 Peripheral vascular disease, unspecified: Secondary | ICD-10-CM | POA: Diagnosis present

## 2017-07-18 DIAGNOSIS — R570 Cardiogenic shock: Secondary | ICD-10-CM

## 2017-07-18 DIAGNOSIS — I214 Non-ST elevation (NSTEMI) myocardial infarction: Secondary | ICD-10-CM | POA: Diagnosis present

## 2017-07-18 DIAGNOSIS — Z7902 Long term (current) use of antithrombotics/antiplatelets: Secondary | ICD-10-CM

## 2017-07-18 DIAGNOSIS — I25110 Atherosclerotic heart disease of native coronary artery with unstable angina pectoris: Secondary | ICD-10-CM | POA: Diagnosis not present

## 2017-07-18 DIAGNOSIS — Z8249 Family history of ischemic heart disease and other diseases of the circulatory system: Secondary | ICD-10-CM | POA: Diagnosis not present

## 2017-07-18 DIAGNOSIS — Z7901 Long term (current) use of anticoagulants: Secondary | ICD-10-CM

## 2017-07-18 DIAGNOSIS — Z8711 Personal history of peptic ulcer disease: Secondary | ICD-10-CM

## 2017-07-18 DIAGNOSIS — N179 Acute kidney failure, unspecified: Secondary | ICD-10-CM | POA: Diagnosis present

## 2017-07-18 DIAGNOSIS — I714 Abdominal aortic aneurysm, without rupture: Secondary | ICD-10-CM | POA: Diagnosis present

## 2017-07-18 DIAGNOSIS — R0789 Other chest pain: Secondary | ICD-10-CM | POA: Diagnosis not present

## 2017-07-18 DIAGNOSIS — M810 Age-related osteoporosis without current pathological fracture: Secondary | ICD-10-CM | POA: Diagnosis present

## 2017-07-18 DIAGNOSIS — N184 Chronic kidney disease, stage 4 (severe): Secondary | ICD-10-CM | POA: Diagnosis not present

## 2017-07-18 DIAGNOSIS — Z79899 Other long term (current) drug therapy: Secondary | ICD-10-CM

## 2017-07-18 DIAGNOSIS — Z8261 Family history of arthritis: Secondary | ICD-10-CM

## 2017-07-18 DIAGNOSIS — I9589 Other hypotension: Secondary | ICD-10-CM | POA: Diagnosis not present

## 2017-07-18 DIAGNOSIS — R079 Chest pain, unspecified: Secondary | ICD-10-CM | POA: Diagnosis not present

## 2017-07-18 DIAGNOSIS — I200 Unstable angina: Secondary | ICD-10-CM | POA: Diagnosis not present

## 2017-07-18 DIAGNOSIS — I10 Essential (primary) hypertension: Secondary | ICD-10-CM | POA: Diagnosis not present

## 2017-07-18 DIAGNOSIS — L509 Urticaria, unspecified: Secondary | ICD-10-CM | POA: Diagnosis present

## 2017-07-18 DIAGNOSIS — E785 Hyperlipidemia, unspecified: Secondary | ICD-10-CM | POA: Diagnosis present

## 2017-07-18 DIAGNOSIS — D649 Anemia, unspecified: Secondary | ICD-10-CM | POA: Diagnosis present

## 2017-07-18 DIAGNOSIS — N178 Other acute kidney failure: Secondary | ICD-10-CM | POA: Diagnosis not present

## 2017-07-18 LAB — CBC WITH DIFFERENTIAL/PLATELET
Basophils Absolute: 0 10*3/uL (ref 0.0–0.1)
Basophils Relative: 0 %
EOS ABS: 0.6 10*3/uL (ref 0.0–0.7)
EOS PCT: 6 %
HCT: 28 % — ABNORMAL LOW (ref 36.0–46.0)
Hemoglobin: 9.3 g/dL — ABNORMAL LOW (ref 12.0–15.0)
LYMPHS ABS: 0.8 10*3/uL (ref 0.7–4.0)
LYMPHS PCT: 7 %
MCH: 30.6 pg (ref 26.0–34.0)
MCHC: 33.2 g/dL (ref 30.0–36.0)
MCV: 92.1 fL (ref 78.0–100.0)
MONOS PCT: 14 %
Monocytes Absolute: 1.5 10*3/uL — ABNORMAL HIGH (ref 0.1–1.0)
Neutro Abs: 7.9 10*3/uL — ABNORMAL HIGH (ref 1.7–7.7)
Neutrophils Relative %: 73 %
PLATELETS: 287 10*3/uL (ref 150–400)
RBC: 3.04 MIL/uL — ABNORMAL LOW (ref 3.87–5.11)
RDW: 14.4 % (ref 11.5–15.5)
WBC: 10.7 10*3/uL — ABNORMAL HIGH (ref 4.0–10.5)

## 2017-07-18 LAB — COMPREHENSIVE METABOLIC PANEL
ALT: 15 U/L (ref 14–54)
ANION GAP: 8 (ref 5–15)
AST: 17 U/L (ref 15–41)
Albumin: 2.6 g/dL — ABNORMAL LOW (ref 3.5–5.0)
Alkaline Phosphatase: 66 U/L (ref 38–126)
BUN: 26 mg/dL — ABNORMAL HIGH (ref 6–20)
CALCIUM: 7.8 mg/dL — AB (ref 8.9–10.3)
CO2: 18 mmol/L — AB (ref 22–32)
CREATININE: 2.23 mg/dL — AB (ref 0.44–1.00)
Chloride: 111 mmol/L (ref 101–111)
GFR, EST AFRICAN AMERICAN: 22 mL/min — AB (ref >60)
GFR, EST NON AFRICAN AMERICAN: 19 mL/min — AB (ref >60)
Glucose, Bld: 83 mg/dL (ref 65–99)
Potassium: 4.2 mmol/L (ref 3.5–5.1)
SODIUM: 137 mmol/L (ref 135–145)
Total Bilirubin: 0.6 mg/dL (ref 0.3–1.2)
Total Protein: 5 g/dL — ABNORMAL LOW (ref 6.5–8.1)

## 2017-07-18 LAB — URINALYSIS, ROUTINE W REFLEX MICROSCOPIC
Bilirubin Urine: NEGATIVE
Glucose, UA: NEGATIVE mg/dL
Ketones, ur: NEGATIVE mg/dL
NITRITE: NEGATIVE
PROTEIN: NEGATIVE mg/dL
SPECIFIC GRAVITY, URINE: 1.005 (ref 1.005–1.030)
pH: 6 (ref 5.0–8.0)

## 2017-07-18 LAB — PROTIME-INR
INR: 1.34
PROTHROMBIN TIME: 16.7 s — AB (ref 11.4–15.2)

## 2017-07-18 LAB — I-STAT TROPONIN, ED: TROPONIN I, POC: 0.04 ng/mL (ref 0.00–0.08)

## 2017-07-18 SURGERY — LEFT HEART CATH AND CORONARY ANGIOGRAPHY
Anesthesia: LOCAL

## 2017-07-18 MED ORDER — HEPARIN (PORCINE) IN NACL 100-0.45 UNIT/ML-% IJ SOLN
1100.0000 [IU]/h | INTRAMUSCULAR | Status: DC
  Administered 2017-07-18: 750 [IU]/h via INTRAVENOUS
  Filled 2017-07-18 (×2): qty 250

## 2017-07-18 MED ORDER — ATORVASTATIN CALCIUM 40 MG PO TABS
40.0000 mg | ORAL_TABLET | Freq: Every day | ORAL | Status: DC
  Administered 2017-07-20: 40 mg via ORAL
  Filled 2017-07-18 (×2): qty 1

## 2017-07-18 MED ORDER — SODIUM CHLORIDE 0.9 % IV BOLUS (SEPSIS)
500.0000 mL | Freq: Once | INTRAVENOUS | Status: AC
  Administered 2017-07-18: 500 mL via INTRAVENOUS

## 2017-07-18 MED ORDER — MORPHINE BOLUS VIA INFUSION
1.0000 mg | INTRAVENOUS | Status: DC | PRN
  Administered 2017-07-18 – 2017-07-20 (×7): 1 mg via INTRAVENOUS
  Filled 2017-07-18: qty 1

## 2017-07-18 MED ORDER — ONDANSETRON HCL 4 MG PO TABS: 4.0000 mg | ORAL_TABLET | Freq: Four times a day (QID) | ORAL | Status: DC | PRN

## 2017-07-18 MED ORDER — ACETAMINOPHEN 650 MG RE SUPP: 650.0000 mg | Freq: Four times a day (QID) | RECTAL | Status: DC | PRN

## 2017-07-18 MED ORDER — SODIUM CHLORIDE 0.9 % IV BOLUS (SEPSIS)
1000.0000 mL | Freq: Once | INTRAVENOUS | Status: AC
  Administered 2017-07-18: 1000 mL via INTRAVENOUS

## 2017-07-18 MED ORDER — ACETAMINOPHEN 325 MG PO TABS: 650.0000 mg | ORAL_TABLET | Freq: Four times a day (QID) | ORAL | Status: DC | PRN

## 2017-07-18 MED ORDER — NITROGLYCERIN IN D5W 200-5 MCG/ML-% IV SOLN
0.0000 ug/min | Freq: Once | INTRAVENOUS | Status: DC
  Filled 2017-07-18: qty 250

## 2017-07-18 MED ORDER — CLOPIDOGREL BISULFATE 75 MG PO TABS
75.0000 mg | ORAL_TABLET | Freq: Every day | ORAL | Status: DC
  Administered 2017-07-20: 75 mg via ORAL
  Filled 2017-07-18 (×2): qty 1

## 2017-07-18 MED ORDER — ONDANSETRON HCL 4 MG/2ML IJ SOLN
4.0000 mg | Freq: Four times a day (QID) | INTRAMUSCULAR | Status: DC | PRN
  Administered 2017-07-20: 4 mg via INTRAVENOUS
  Filled 2017-07-18: qty 2

## 2017-07-18 MED ORDER — HEPARIN BOLUS VIA INFUSION
3000.0000 [IU] | Freq: Once | INTRAVENOUS | Status: AC
  Administered 2017-07-18: 3000 [IU] via INTRAVENOUS
  Filled 2017-07-18: qty 3000

## 2017-07-18 MED ORDER — SODIUM CHLORIDE 0.9 % IV SOLN
1.0000 mg/h | INTRAVENOUS | Status: DC
  Administered 2017-07-18: 1 mg/h via INTRAVENOUS
  Filled 2017-07-18: qty 10

## 2017-07-18 MED ORDER — MORPHINE SULFATE (PF) 4 MG/ML IV SOLN
4.0000 mg | Freq: Once | INTRAVENOUS | Status: AC
  Administered 2017-07-18: 4 mg via INTRAVENOUS
  Filled 2017-07-18: qty 1

## 2017-07-18 NOTE — H&P (Signed)
History and Physical    [name] RUE:[id number] DOB: 1931/12/14 DOA: 08/09/17  PCP: Bennie Pierini, FNP  Patient coming from: Home  I have personally briefly reviewed patient's old medical records in [facility]  Chief Complaint: Chest pain  HPI: [name] is a 83 y.o. female with medical history significant of CAD, silent MI with LV aneurysm and rupture s/p repair 1999, PVD including carotid artery disease s/p carotid-subclavian bypass and CEA, aortofemoral bypass, AAA without rupture, HTN, HLD, CVA with vision loss and recent NSTEMI admission on from 8/2 to 8/6.  Cath done just prior to that showed severe disease, she was not felt to be a candidate for intervention.  Dr. Cornelius Moras was consulted, he didn't feal that she was a candidate for CABG.  Per Dr. York Spaniel discharge note on 8/6 he was concerned that she would soon be re-admitted with MI.  Per his discharge note "She did not wish to be considered for hospice at this time."  Today she developed severe chest pain at around 5:35 pm.  Pain is intermittent but never resolves completely.  EKG today shows SR with TWI in anterior inferior leads slightly better than prior.  BP is too soft for IV NTG in the ED.  She took Imdur before coming to the ED.   ED Course: She was seen by Cardiology, who recommended "Consider IM admission with hospice care" and cards attending recommended heparin gtt, 500 cc bolus, and stated "I think hospice should be consulted."   Review of Systems: As per HPI otherwise 10 point review of systems negative.   Past Medical History:  Diagnosis Date  . Aortic aneurysm (HCC)   . 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 Cardiac aneurysm    ventrral - bypass   . Carotid artery occlusion   . Carotid bruit    bil carotid bruits   . Cataract   . GERD (gastroesophageal reflux disease)   . Gout   . H/O blood clots   . History of aorto-femoral bypass   . Hyperlipidemia   .  Hypertension   . Lower back pain    disc   . Migraines   . Myocardial infarction (HCC)   . Osteoporosis   . Pancreatitis   . Peptic ulcer    Hx   . Stricture esophagus    Hx  . Stroke [facility])    Right eye stroke  . Urethral stenosis     Past Surgical History:  Procedure Laterality Date  . ABDOMINAL HYSTERECTOMY    . AORTA - BILATERAL FEMORAL ARTERY BYPASS GRAFT  7/99   Dr. Madilyn Fireman   . aortic & cardiac aneurysm rupture  04/1998  . APPENDECTOMY    . ARCH AORTOGRAM N/A 11/17/2012   Procedure: ARCH AORTOGRAM;  Surgeon: Chuck Hint, MD;  Location: [facility] CATH LAB;  Service: Cardiovascular;  Laterality: N/A;  . CAROTID ENDARTERECTOMY  July 2003   Left  . CAROTID ENDARTERECTOMY  11/18/2007   Left  . CORONARY ARTERY BYPASS GRAFT     16 yrs ago  . ESOPHAGEAL DILATION  1996  . ESOPHAGOGASTRODUODENOSCOPY (EGD) WITH PROPOFOL N/A 07/12/2015   Procedure: ESOPHAGOGASTRODUODENOSCOPY (EGD) WITH PROPOFOL with possible dilatation;  Surgeon: Bernette Redbird, MD;  Location: [facility] ENDOSCOPY;  Service: Endoscopy;  Laterality: N/A;  Consideration of endotracheal intubation for airway protection  . EYE SURGERY    . IABP INSERTION N/A 07/10/2017   Procedure: IABP Insertion;  Surgeon: Corky Crafts, MD;  Location: [facility] INVASIVE CV LAB;  Service: Cardiovascular;  Laterality: N/A;  . LEFT HEART CATH AND CORONARY ANGIOGRAPHY N/A 07/10/2017   Procedure: LEFT HEART CATH AND CORONARY ANGIOGRAPHY;  Surgeon: Corky Crafts, MD;  Location: [facility] INVASIVE CV LAB;  Service: Cardiovascular;  Laterality: N/A;  . LUMBAR DISC SURGERY  1993  . PARTIAL HYSTERECTOMY  1966   single oopherectomy (right)   . STOMACH SURGERY     ruptured ulcer     reports that she quit smoking about 19 years ago. She has never used smokeless tobacco. She reports that she does not drink alcohol or use drugs.  Allergies  Allergen Reactions  . Alendronate Sodium Other (See Comments)    Irritates esophagus and ulcers  . Buffered Aspirin  Other (See Comments)    On blood thinners but doctor has started aspirin as stroke therapy  . Ibuprofen Other (See Comments)    Blood thinner therapy  . Prednisone Other (See Comments)    Patient states due to osteoporosis Ok if not long term    Family History  Problem Relation Age of Onset  . CAD Father        died age 52+  . Heart disease Father        Older than 60  . Heart attack Father   . Osteoarthritis Sister        paraplegic due to disk rupture  . CAD Sister   . Arthritis Sister   . Varicose Veins Mother   . Colon cancer Neg Hx      Prior to Admission medications   Medication Sig Start Date End Date Taking? Authorizing Provider  amLODipine (NORVASC) 10 MG tablet Take 1 tablet (10 mg total) by mouth daily. 07/16/17  Yes Othella Boyer, MD  atorvastatin (LIPITOR) 40 MG tablet Take 1 tablet (40 mg total) by mouth daily. 02/08/17  Yes Daphine Deutscher, Mary-Aeryn, FNP  clopidogrel (PLAVIX) 75 MG tablet Take 1 tablet (75 mg total) by mouth daily. 07/16/17  Yes Othella Boyer, MD  isosorbide mononitrate (IMDUR) 60 MG 24 hr tablet Take 1 tablet (60 mg total) by mouth daily. 07/16/17  Yes Othella Boyer, MD  metoprolol tartrate (LOPRESSOR) 100 MG tablet Take 1 tablet (100 mg total) by mouth 2 (two) times daily. 07/15/17  Yes Othella Boyer, MD  triamterene-hydrochlorothiazide (MAXZIDE-25) 37.5-25 MG tablet Take 1 tablet by mouth daily. 07/01/17  Yes [provider]  warfarin (COUMADIN) 1 MG tablet Take 1-2 tablets (1-2 mg total) by mouth daily. Patient taking differently: Take 1 mg by mouth daily.  05/28/17  Yes Henrene Pastor, PharmD  HYDROcodone-acetaminophen (NORCO/VICODIN) 5-325 MG tablet Take 1 tablet by mouth every 6 (six) hours as needed. For pain Patient not taking: Reported on 08-10-17 05/07/17   Bennie Pierini, FNP  pantoprazole (PROTONIX) 40 MG tablet Take 1 tablet (40 mg total) by mouth daily. Patient not taking: Reported on Aug 10, 2017 02/08/17   Bennie Pierini, FNP    Physical Exam: Vitals:   August 10, 2017 2023 08/10/17 2030 2017/08/10 2100 08-10-2017 2201  BP: (!) 92/57 (!) 91/50 (!) 96/52 (!) 100/53  Pulse: 79 74 76   Resp: 19 14 20 18   Temp:      TempSrc:      SpO2: 98% 96% 94%   Weight:      Height:        Constitutional: NAD, calm, comfortable Eyes: PERRL, lids and conjunctivae normal ENMT: Mucous membranes are moist. Posterior pharynx clear of any exudate or  lesions.Normal dentition.  Neck: normal, supple, no masses, no thyromegaly Respiratory: clear to auscultation bilaterally, no wheezing, no crackles. Normal respiratory effort. No accessory muscle use.  Cardiovascular: Regular rate and rhythm, no murmurs / rubs / gallops. No extremity edema. 2+ pedal pulses. No carotid bruits.  Abdomen: no tenderness, no masses palpated. No hepatosplenomegaly. Bowel sounds positive.  Musculoskeletal: no clubbing / cyanosis. No joint deformity upper and lower extremities. Good ROM, no contractures. Normal muscle tone.  Skin: no rashes, lesions, ulcers. No induration Neurologic: CN 2-12 grossly intact. Sensation intact, DTR normal. Strength 5/5 in all 4.  Psychiatric: Normal judgment and insight. Alert and oriented x 3. Normal mood.    Labs on Admission: I have personally reviewed following labs and imaging studies  CBC:  Recent Labs Lab 07/12/17 0136 07/13/17 0246 07/14/17 0204 07/15/17 0151 2017/10/28 1939  WBC 8.8 10.6* 10.1 11.5* 10.7*  NEUTROABS  --   --   --   --  7.9*  HGB 10.7* 10.2* 10.4* 11.7* 9.3*  HCT 32.8* 30.5* 31.6* 36.0 28.0*  MCV 93.4 92.4 91.1 93.8 92.1  PLT 231 210 259 299 287   Basic Metabolic Panel:  Recent Labs Lab 07/12/17 0136 07/13/17 0246 07/14/17 0204 07/15/17 0151 2017/10/28 1939  NA 139 137 137 137 137  K 3.9 4.1 3.9 3.7 4.2  CL 108 107 107 104 111  CO2 23 24 22 23  18*  GLUCOSE 124* 117* 109* 87 83  BUN 22* 26* 26* 24* 26*  CREATININE 1.36* 1.60* 1.59* 1.66* 2.23*  CALCIUM 8.4* 8.2* 8.6*  8.5* 7.8*   GFR: Estimated Creatinine Clearance: 17.2 mL/min (A) (by C-G formula based on SCr of 2.23 mg/dL (H)). Liver Function Tests:  Recent Labs Lab 2017/10/28 1939  AST 17  ALT 15  ALKPHOS 66  BILITOT 0.6  PROT 5.0*  ALBUMIN 2.6*   No results for input(s): LIPASE, AMYLASE in the last 168 hours. No results for input(s): AMMONIA in the last 168 hours. Coagulation Profile:  Recent Labs Lab 07/12/17 0136 07/13/17 0246 07/14/17 0204 07/15/17 0151 2017/10/28 1939  INR 1.03 1.06 1.03 1.11 1.34   Cardiac Enzymes: No results for input(s): CKTOTAL, CKMB, CKMBINDEX, TROPONINI in the last 168 hours. BNP (last 3 results) No results for input(s): PROBNP in the last 8760 hours. HbA1C: No results for input(s): HGBA1C in the last 72 hours. CBG:  Recent Labs Lab 07/13/17 1144 07/13/17 1558 07/14/17 0754 07/14/17 1934 07/15/17 0758  GLUCAP 104* 107* 111* 146* 106*   Lipid Profile: No results for input(s): CHOL, HDL, LDLCALC, TRIG, CHOLHDL, LDLDIRECT in the last 72 hours. Thyroid Function Tests: No results for input(s): TSH, T4TOTAL, FREET4, T3FREE, THYROIDAB in the last 72 hours. Anemia Panel: No results for input(s): VITAMINB12, FOLATE, FERRITIN, TIBC, IRON, RETICCTPCT in the last 72 hours. Urine analysis:    Component Value Date/Time   COLORURINE YELLOW 09-May-2017 2234   APPEARANCEUR CLEAR 09-May-2017 2234   LABSPEC 1.005 09-May-2017 2234   PHURINE 6.0 09-May-2017 2234   GLUCOSEU NEGATIVE 09-May-2017 2234   HGBUR MODERATE (A) 09-May-2017 2234   BILIRUBINUR NEGATIVE 09-May-2017 2234   KETONESUR NEGATIVE 09-May-2017 2234   PROTEINUR NEGATIVE 09-May-2017 2234   UROBILINOGEN 0.2 10/27/2012 1754   NITRITE NEGATIVE 09-May-2017 2234   LEUKOCYTESUR MODERATE (A) 09-May-2017 2234    Radiological Exams on Admission: Dg Chest Port 1 View  Result Date: 08/05/2017 CLINICAL DATA:  Acute chest pain EXAM: PORTABLE CHEST 1 VIEW COMPARISON:  07/12/2017 and prior exams FINDINGS: Cardiomegaly  and CABG  changes again noted. Pulmonary vascular congestion is identified. Left basilar atelectasis/scarring again noted. There is no evidence of pleural effusion or pneumothorax. No acute bony abnormalities are present. IMPRESSION: Cardiomegaly with pulmonary vascular congestion. Electronically Signed   By: Harmon Pier M.D.   On: 07/17/2017 19:39    EKG: Independently reviewed.  Assessment/Plan Principal Problem:   Unstable angina pectoris due to coronary arteriosclerosis (HCC) Active Problems:   CKD (chronic kidney disease) stage 4, GFR 15-29 ml/min (HCC)   AKI (acute kidney injury) (HCC)    1. Unstable coronary syndrome - 1. After long discussion with patient, family, pastor; after review of prior records, noting that Dr. Donnie Aho offered hospice according to his discharge summary 3 days ago, and noting that Dr. Purvis Sheffield recommended in his note this evening that he felt Hospice should be consulted / "IM admission with hospice care".  Given patients lower BPs and ongoing severe CP in the ED, had frank discussion about prognosis of severe CAD not as a candidate for intervention.  I offered / recommended that the patient consider comfort measures and changing goals of care to focus more on her comfort than length of life.  She and family agreed that her primary goal at this point is comfort. 2. Given goal of comfort: will order morphine gtt to provide comfort to this patient, who is in severe pain, despite risk of lower BP. 3. Will hold BP meds since BP is quite low. 4. If BP rises, consider adding nitro gtt. 1. Per Dr. Donnie Aho:  It should be noted that her central blood pressures run 30-40 points higher than her peripheral blood pressures. 5. Will continue heparin gtt that was ordered for now.  Holding coumadin. 6. Call pal care to discuss hospice options tomorrow. 1. Until that happens will leave on tele monitor 2. Will get serial trops until then. 7. Will allow patient to eat and  drink. 8. Clarified DNR/DNI code status with patient. 2. AKI on CKD - 1. Got IVF 2. But CXR already showing pulm vascular congestion. 3. Will get BMP repeat in AM.  DVT prophylaxis: Heparin gtt for now Code Status: DNR/DNI goal of care is "keep me comfortable" Family Communication: Family at bedside Disposition Plan: TBD Consults called: Cards has seen patient, see their note in chart Admission status: Admit to inpatient - inpatient status for morphine gtt   Hillary Bow DO [facility] Pager [phone]  If 7AM-7PM, please contact day team taking care of patient [email or web address] Password [email or web address]  08/06/2017, 11:02 PM

## 2017-07-18 NOTE — Telephone Encounter (Signed)
I called patient about rechecking INR.  She was discharged from hospital 07/15/17.  It appeared that home health was ordered and I was going to order INR check.  When I spoke to patient she states that home health agency came out 1 day for initial assessment but she told them that she did not need home health services.  I asked is she could come in tomorrow to have INR rechecked.  Mrs. [name] states that Dr [name] told her that he would check INR at visit on Monday, August 13th.  Will follow up on results after that visit.

## 2017-07-18 NOTE — ED Provider Notes (Signed)
[facility]-EM[facility]ENCY DEPT Provider Note   CSN: [id number] Arrival date & time: 07/30/2017  1842     History   Chief Complaint Chief Complaint  Patient presents with  . Code STEMI  . Chest Pain    HPI [name] is a 83 y.o. female history of aortic aneurysm, reflux, hypertension, hyperlipidemia, MI with recent cath that showed left main disease, here presenting with chest pain, syncope. Patient was recently admitted to the hospital and had a cath that showed diffuse disease and patient was poor candidate for intervention and it was decided to just continue medical management for now. Patient was on her porch and had sudden onset of chest pain and passed out. There was no head injury at that time. EMS was called and was possible STEMI so code STEMI was activated.     The history is provided by the patient.    Past Medical History:  Diagnosis Date  . Aortic aneurysm (HCC)   . 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 Cardiac aneurysm    ventrral - bypass   . Carotid artery occlusion   . Carotid bruit    bil carotid bruits   . Cataract   . GERD (gastroesophageal reflux disease)   . Gout   . H/O blood clots   . History of aorto-femoral bypass   . Hyperlipidemia   . Hypertension   . Lower back pain    disc   . Migraines   . Myocardial infarction (HCC)   . Osteoporosis   . Pancreatitis   . Peptic ulcer    Hx   . Stricture esophagus    Hx  . Stroke [facility](HCC)    Right eye stroke  . Urethral stenosis     Patient Active Problem List   Diagnosis Date Noted  . Electrocardiogram suggestive of ST elevation myocardial infarction (STEMI) (HCC) 07/11/2017  . ST elevation myocardial infarction (STEMI) (HCC)   . Shortness of breath   . Pre-syncope 07/10/2017  . NSTEMI (non-ST elevated myocardial infarction) (HCC) 07/10/2017  . Chest pain   . Elevated troponin   . CKD (chronic kidney disease) stage 4, GFR 15-29 ml/min (HCC) 06/21/2017  . Metabolic syndrome  08/25/2015  . Esophageal obstruction due to food impaction 07/12/2015  . Peripheral vascular disease (HCC) 07/12/2015  . Old anterior myocardial infarction   . History of stroke   . Peripheral neuropathy   . AAA (abdominal aortic aneurysm) without rupture (HCC) 03/24/2013  . Chronic anticoagulation 08/07/2011  . Cerebrovascular disease 08/07/2011  . PVD (peripheral vascular disease) (HCC) 08/07/2011  . Hyperlipidemia 08/07/2011  . Ventricular aneurysm 08/07/2011  . GERD with stricture 08/07/2011  . COPD (chronic obstructive pulmonary disease) (HCC) 08/07/2011  . CAD (coronary artery disease)   . Hypertensive heart disease     Past Surgical History:  Procedure Laterality Date  . ABDOMINAL HYSTERECTOMY    . AORTA - BILATERAL FEMORAL ARTERY BYPASS GRAFT  7/99   Dr. Madilyn FiremanHayes   . aortic & cardiac aneurysm rupture  04/1998  . APPENDECTOMY    . ARCH AORTOGRAM N/A 11/17/2012   Procedure: ARCH AORTOGRAM;  Surgeon: Chuck Hinthristopher S Dickson, MD;  [facility];  Service: Cardiovascular;  Laterality: N/A;  . CAROTID ENDARTERECTOMY  July 2003   Left  . CAROTID ENDARTERECTOMY  11/18/2007   Left  . CORONARY ARTERY BYPASS GRAFT     16 yrs ago  . ESOPHAGEAL DILATION  1996  . ESOPHAGOGASTRODUODENOSCOPY (EGD) WITH PROPOFOL  N/A 07/12/2015   Procedure: ESOPHAGOGASTRODUODENOSCOPY (EGD) WITH PROPOFOL with possible dilatation;  Surgeon: Bernette Redbird, MD;  Location: [facility] ENDOSCOPY;  Service: Endoscopy;  Laterality: N/A;  Consideration of endotracheal intubation for airway protection  . EYE SURGERY    . IABP INSERTION N/A 07/10/2017   Procedure: IABP Insertion;  Surgeon: Corky Crafts, MD;  Location: [facility] INVASIVE CV LAB;  Service: Cardiovascular;  Laterality: N/A;  . LEFT HEART CATH AND CORONARY ANGIOGRAPHY N/A 07/10/2017   Procedure: LEFT HEART CATH AND CORONARY ANGIOGRAPHY;  Surgeon: Corky Crafts, MD;  Location: [facility] INVASIVE CV LAB;  Service: Cardiovascular;  Laterality: N/A;  . LUMBAR DISC  SURGERY  1993  . PARTIAL HYSTERECTOMY  1966   single oopherectomy (right)   . STOMACH SURGERY     ruptured ulcer    OB History    No data available       Home Medications    Prior to Admission medications   Medication Sig Start Date End Date Taking? Authorizing Provider  amLODipine (NORVASC) 10 MG tablet Take 1 tablet (10 mg total) by mouth daily. 07/16/17  Yes Othella Boyer, MD  atorvastatin (LIPITOR) 40 MG tablet Take 1 tablet (40 mg total) by mouth daily. 02/08/17  Yes Daphine Deutscher, Mary-Shahana, FNP  clopidogrel (PLAVIX) 75 MG tablet Take 1 tablet (75 mg total) by mouth daily. 07/16/17  Yes Othella Boyer, MD  isosorbide mononitrate (IMDUR) 60 MG 24 hr tablet Take 1 tablet (60 mg total) by mouth daily. 07/16/17  Yes Othella Boyer, MD  metoprolol tartrate (LOPRESSOR) 100 MG tablet Take 1 tablet (100 mg total) by mouth 2 (two) times daily. 07/15/17  Yes Othella Boyer, MD  triamterene-hydrochlorothiazide (MAXZIDE-25) 37.5-25 MG tablet Take 1 tablet by mouth daily. 07/01/17  Yes [provider]  warfarin (COUMADIN) 1 MG tablet Take 1-2 tablets (1-2 mg total) by mouth daily. Patient taking differently: Take 1 mg by mouth daily.  05/28/17  Yes Henrene Pastor, PharmD  HYDROcodone-acetaminophen (NORCO/VICODIN) 5-325 MG tablet Take 1 tablet by mouth every 6 (six) hours as needed. For pain Patient not taking: Reported on 07-30-17 05/07/17   Bennie Pierini, FNP  pantoprazole (PROTONIX) 40 MG tablet Take 1 tablet (40 mg total) by mouth daily. Patient not taking: Reported on 2017-07-30 02/08/17   Bennie Pierini, FNP    Family History Family History  Problem Relation Age of Onset  . CAD Father        died age 44+  . Heart disease Father        Older than 60  . Heart attack Father   . Osteoarthritis Sister        paraplegic due to disk rupture  . CAD Sister   . Arthritis Sister   . Varicose Veins Mother   . Colon cancer Neg Hx     Social History Social History    Substance Use Topics  . Smoking status: Former Smoker    Quit date: 04/06/1998  . Smokeless tobacco: Never Used  . Alcohol use No     Allergies   Alendronate sodium; Buffered aspirin; Ibuprofen; and Prednisone   Review of Systems Review of Systems  Cardiovascular: Positive for chest pain.  Neurological: Positive for syncope.  All other systems reviewed and are negative.    Physical Exam Updated Vital Signs BP (!) 91/50   Pulse 74   Temp 98.9 F (37.2 C) (Oral)   Resp 14   Ht 5\' 5"  (1.651 m)   Wt 64.9 kg (  143 lb)   SpO2 96%   BMI 23.80 kg/m   Physical Exam  Constitutional:  Ill appearing   HENT:  Head: Normocephalic.  Mouth/Throat: Oropharynx is clear and moist.  Eyes: Pupils are equal, round, and reactive to light. Conjunctivae and EOM are normal.  Neck: Normal range of motion. Neck supple.  Cardiovascular: Normal rate, regular rhythm and normal heart sounds.   Pulmonary/Chest: Effort normal and breath sounds normal. No respiratory distress. She has no wheezes. She has no rales.  Abdominal: Soft. Bowel sounds are normal. She exhibits no distension. There is no tenderness.  No pulsatile mass   Musculoskeletal: Normal range of motion.  Skin: Skin is warm.  Psychiatric: She has a normal mood and affect.  Nursing note and vitals reviewed.    ED Treatments / Results  Labs (all labs ordered are listed, but only abnormal results are displayed) Labs Reviewed  CBC WITH DIFFERENTIAL/PLATELET - Abnormal; Notable for the following:       Result Value   WBC 10.7 (*)    RBC 3.04 (*)    Hemoglobin 9.3 (*)    HCT 28.0 (*)    Neutro Abs 7.9 (*)    Monocytes Absolute 1.5 (*)    All other components within normal limits  COMPREHENSIVE METABOLIC PANEL - Abnormal; Notable for the following:    CO2 18 (*)    BUN 26 (*)    Creatinine, Ser 2.23 (*)    Calcium 7.8 (*)    Total Protein 5.0 (*)    Albumin 2.6 (*)    GFR calc non Af Amer 19 (*)    GFR calc Af Amer 22 (*)     All other components within normal limits  PROTIME-INR - Abnormal; Notable for the following:    Prothrombin Time 16.7 (*)    All other components within normal limits  I-STAT TROPONIN, ED    EKG  EKG Interpretation  Date/Time:  Thursday July 18 2017 18:51:06 EDT Ventricular Rate:  88 PR Interval:    QRS Duration: 90 QT Interval:  356 QTC Calculation: 431 R Axis:   81 Text Interpretation:  Sinus rhythm Ventricular premature complex Borderline right axis deviation Probable LVH with secondary repol abnrm Abnormal T, probable ischemia, anterior leads st depression improved since previous  Confirmed by Richardean Canal ([id number]) on 08/08/2017 7:29:34 PM       Radiology Dg Chest Port 1 View  Result Date: 07/13/2017 CLINICAL DATA:  Acute chest pain EXAM: PORTABLE CHEST 1 VIEW COMPARISON:  07/12/2017 and prior exams FINDINGS: Cardiomegaly and CABG changes again noted. Pulmonary vascular congestion is identified. Left basilar atelectasis/scarring again noted. There is no evidence of pleural effusion or pneumothorax. No acute bony abnormalities are present. IMPRESSION: Cardiomegaly with pulmonary vascular congestion. Electronically Signed   By: Harmon Pier M.D.   On: 07/13/2017 19:39    Procedures Procedures (including critical care time)  CRITICAL CARE Performed by: Richardean Canal   Total critical care time: 30 minutes  Critical care time was exclusive of separately billable procedures and treating other patients.  Critical care was necessary to treat or prevent imminent or life-threatening deterioration.  Critical care was time spent personally by me on the following activities: development of treatment plan with patient and/or surrogate as well as nursing, discussions with consultants, evaluation of patient's response to treatment, examination of patient, obtaining history from patient or surrogate, ordering and performing treatments and interventions, ordering and review of laboratory  studies, ordering and review of  radiographic studies, pulse oximetry and re-evaluation of patient's condition.   Medications Ordered in ED Medications  morphine 4 MG/ML injection 4 mg (4 mg Intravenous Given 10-Aug-2017 1913)  sodium chloride 0.9 % bolus 500 mL (0 mLs Intravenous Stopped 10-Aug-2017 1942)  sodium chloride 0.9 % bolus 1,000 mL (1,000 mLs Intravenous New Bag/Given August 10, 2017 1957)     Initial Impression / Assessment and Plan / ED Course  I have reviewed the triage vital signs and the nursing notes.  Pertinent labs & imaging results that were available during my care of the patient were reviewed by me and considered in my medical decision making (see chart for details).     Loie Jahr is a 83 y.o. female here with chest pain, possible STEMI. Cardiology saw patient on arrival. Had recent cath that showed diffuse disease and medical management was recommended. Cardiology canceled STEMI and recommend IV nitro. However, BP soft so they ordered 500 cc bolus.   8 pm BP still 80s after bolus. Will order 1 L NS bolus.   8:30 pm Still hypotensive around 90/60. Still has pain despite morphine. Held nitro since patient is hypotensive. Ordered heparin. Called cardiology again. Concerned for cardiogenic shock. They don't recommend pressors. Ok with heparin since INR subtherapeutic.   9:30 pm Hospitalist to admit. Patient DNR. Anticipate hospice and will focus on palliative care.    Final Clinical Impressions(s) / ED Diagnoses   Final diagnoses:  None    New Prescriptions New Prescriptions   No medications on file     Charlynne Pander, MD August 10, 2017 2313

## 2017-07-18 NOTE — Progress Notes (Signed)
ANTICOAGULATION CONSULT NOTE  Pharmacy Consult for heparin Indication: chest pain/ACS  Heparin Dosing Weight: 64.9 kg   Assessment: 85 yof with SOB, chest tightness. Pharmacy consulted to dose heparin. Not a candidate for CABG or PCI. Pt on warfarin PTA - last dose 8/9. INR subtherapeutic at 1.34 on admit - ok to bolus. Hg 9.3, plt wnl. No bleed documented. Per Cardiology note, hospice to be consulted.  Goal of Therapy:  Heparin level 0.3-0.7 units/ml Monitor platelets by anticoagulation protocol: Yes   Plan:  Heparin 3000 unit bolus Start heparin at 750 units/h 8h heparin level Daily heparin level/CBC Monitor s/sx bleeding  [name], PharmD, BCPS Clinical Pharmacist 07/17/2017 9:03 PM

## 2017-07-18 NOTE — Consult Note (Signed)
Cardiology Consultation:   Patient ID: [name]; [id number]; 11/30/1932   Admit date: 07/24/2017 Date of Consult: 07/27/2017  Primary Care Provider: Bennie PieriniMartin, Mary-Sumaiya, FNP Primary Cardiologist: Dr. Bernell Listelly    Patient Profile:   [name] is a 83 y.o. female with a hx of CAD with silent MI with LV aneurysm and rupture s/p repair 1999, PVD including carotid artery disease s/p carotid-subclavian bypass and CEA, aortofemoral bypass, AAA without rupture, HTN, HLD, CVA with vision loss and recent STEMI admission who is being seen today for the evaluation of chest pain at the request of Dr. Silverio LayYao.   Recently admitted with NSTEMI. Cath showed severe disease. See detailed cath report below. She did not felt candidate for CABG or intervention. tx with intra aortic ballloon pump x 2 days. Recommended medical management with Hospice. Discharge on Imdur. Reviewed discharge summary in detailed.   History of Present Illness:   [name] presented with code STEMI. It is cancelled. Onset of symptoms 5:35. Still ongoing chest pain with dyspnea. 5/10. No radiation. Pending labs. EKG showed SR with TWI in anterior inferior lateral leads compared to prior EKG segment actually looks better. BP soft for IV nitro. No family at bedside.   Past Medical History:  Diagnosis Date  . Aortic aneurysm (HCC)   . 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 Cardiac aneurysm    ventrral - bypass   . Carotid artery occlusion   . Carotid bruit    bil carotid bruits   . Cataract   . GERD (gastroesophageal reflux disease)   . Gout   . H/O blood clots   . History of aorto-femoral bypass   . Hyperlipidemia   . Hypertension   . Lower back pain    disc   . Migraines   . Myocardial infarction (HCC)   . Osteoporosis   . Pancreatitis   . Peptic ulcer    Hx   . Stricture esophagus    Hx  . Stroke [facility](HCC)    Right eye stroke  . Urethral stenosis     Past Surgical History:  Procedure  Laterality Date  . ABDOMINAL HYSTERECTOMY    . AORTA - BILATERAL FEMORAL ARTERY BYPASS GRAFT  7/99   Dr. Madilyn FiremanHayes   . aortic & cardiac aneurysm rupture  04/1998  . APPENDECTOMY    . ARCH AORTOGRAM N/A 11/17/2012   Procedure: ARCH AORTOGRAM;  Surgeon: Chuck Hinthristopher S Dickson, MD;  [facility];  Service: Cardiovascular;  Laterality: N/A;  . CAROTID ENDARTERECTOMY  July 2003   Left  . CAROTID ENDARTERECTOMY  11/18/2007   Left  . CORONARY ARTERY BYPASS GRAFT     16 yrs ago  . ESOPHAGEAL DILATION  1996  . ESOPHAGOGASTRODUODENOSCOPY (EGD) WITH PROPOFOL N/A 07/12/2015   Procedure: ESOPHAGOGASTRODUODENOSCOPY (EGD) WITH PROPOFOL with possible dilatation;  Surgeon: Bernette Redbirdobert Buccini, MD;  [facility];  Service: Endoscopy;  Laterality: N/A;  Consideration of endotracheal intubation for airway protection  . EYE SURGERY    . IABP INSERTION N/A 07/10/2017   Procedure: IABP Insertion;  Surgeon: Corky CraftsVaranasi, Jayadeep S, MD;  [facility];  Service: Cardiovascular;  Laterality: N/A;  . LEFT HEART CATH AND CORONARY ANGIOGRAPHY N/A 07/10/2017   Procedure: LEFT HEART CATH AND CORONARY ANGIOGRAPHY;  Surgeon: Corky CraftsVaranasi, Jayadeep S, MD;  [facility];  Service: Cardiovascular;  Laterality: N/A;  . LUMBAR DISC SURGERY  1993  . PARTIAL HYSTERECTOMY  1966  single oopherectomy (right)   . STOMACH SURGERY     ruptured ulcer     Inpatient Medications: Scheduled Meds: .  morphine injection  4 mg Intravenous Once   Continuous Infusions: . nitroGLYCERIN    . sodium chloride     PRN Meds:  Allergies:    Allergies  Allergen Reactions  . Alendronate Sodium Other (See Comments)    Irritates esophagus and ulcers  . Buffered Aspirin Other (See Comments)    On blood thinners but doctor has started aspirin as stroke therapy  . Ibuprofen Other (See Comments)    Blood thinner therapy  . Prednisone Other (See Comments)    Patient states due to osteoporosis Ok if not long term      Social History:   Social History   Social History  . Marital status: Widowed    Spouse name: N/A  . Number of children: N/A  . Years of education: N/A   Occupational History  . 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 Smoking status: Former Smoker    Quit date: 04/06/1998  . Smokeless tobacco: Never Used  . Alcohol use No  . Drug use: No  . Sexual activity: No   Other Topics Concern  . Not on file   Social History Narrative  . No narrative on file    Family History:    Family History  Problem Relation Age of Onset  . CAD Father        died age 60+  . Heart disease Father        Older than 60  . Heart attack Father   . Osteoarthritis Sister        paraplegic due to disk rupture  . CAD Sister   . Arthritis Sister   . Varicose Veins Mother   . Colon cancer Neg Hx      ROS:  Please see the history of present illness.  ROS All other ROS reviewed and negative.     Physical Exam/Data:   Vitals:   07/11/2017 1853 07/21/2017 1858 07/19/2017 1901 07/25/2017 1902  BP: (!) 149/124  (!) 103/50   Pulse: 88  82 83  Resp: (!) 22  (!) 22 (!) 21  Temp: 98.9 F (37.2 C)     TempSrc: Oral     SpO2: 94%  92% 96%  Weight:  143 lb (64.9 kg)    Height:  5\' 5"  (1.651 m)     No intake or output data in the 24 hours ending 08/07/2017 1911 Filed Weights   07/15/2017 1858  Weight: 143 lb (64.9 kg)   Body mass index is 23.8 kg/m.  General:  Thin frail ill appearing female  HEENT: normal Lymph: no adenopathy Neck: no JVD Endocrine:  No thryomegaly Vascular: No carotid bruits; FA pulses 2+ bilaterally without bruits  Cardiac:  normal S1, S2; RRR; systolic murmur  Lungs:  clear to auscultation bilaterally, no wheezing, rhonchi or rales  Abd: soft, nontender, no hepatomegaly  Ext: no edema Musculoskeletal:  No deformities, BUE and BLE strength normal and equal Skin: warm and dry  Neuro:  CNs 2-12 intact, no focal abnormalities noted Psych:   Normal affect    Relevant CV Studies: LEFT HEART CATH AND CORONARY ANGIOGRAPHY 07/10/17  Conclusion     Ost LM to LM lesion, 75 %stenosed.  Ost Cx to Prox Cx lesion, 95 %stenosed.  LM lesion, 75 %stenosed.  Mid LAD-2 lesion,  80 %stenosed.  Mid LAD-1 lesion, 99 %stenosed.  1st Diag lesion, 25 %stenosed.  Mid RCA lesion, 25 %stenosed.  LV end diastolic pressure is normal.  There is no aortic valve stenosis.  Successful IABP placement.   Severe calcific left main, LAD and circumflex disease.  After intra-aortic balloon pump placement, her chest discomfort is gone. Repeat ECG shows significant improvement in the ST depression that was noted preprocedure. Would recommend consult and cardiac surgery in the morning. I'm not sure if she would be an operative candidate, but any attempt at PCI would have to involve atherectomy of the left main, LAD and circumflex and would be extremely high risk.    Plan to support her with the intra-aortic balloon pump and treat her systemic hypertension with IV nitroglycerin. Once these interventions were started even in the Cath Lab, she felt better. She no longer has any chest discomfort and her oxygen saturations have improved. Further management per Dr. Donnie Aho.   Echo 07/11/17 Study Conclusions  - Left ventricle: The cavity size was normal. There was moderate   concentric hypertrophy. Systolic function was normal. The   estimated ejection fraction was in the range of 50% to 55%.   Hypokinesis of the anteroseptal myocardium. Doppler parameters   are consistent with abnormal left ventricular relaxation (grade 1   diastolic dysfunction). Doppler parameters are consistent with   high ventricular filling pressure. - Aortic valve: Valve mobility was restricted. There was moderate   stenosis. There was no regurgitation. Valve area (VTI): 1.11   cm^2. Valve area (Vmax): 0.92 cm^2. Valve area (Vmean): 1 cm^2. - Mitral valve: Transvalvular velocity  was within the normal range.   There was no evidence for stenosis. There was trivial   regurgitation. - Left atrium: The atrium was mildly dilated. - Right ventricle: The cavity size was normal. Wall thickness was   normal. Systolic function was normal. - Tricuspid valve: There was trivial regurgitation.  Laboratory Data:  Chemistry Recent Labs Lab 07/13/17 0246 07/14/17 0204 07/15/17 0151  NA 137 137 137  K 4.1 3.9 3.7  CL 107 107 104  CO2 24 22 23   GLUCOSE 117* 109* 87  BUN 26* 26* 24*  CREATININE 1.60* 1.59* 1.66*  CALCIUM 8.2* 8.6* 8.5*  GFRNONAA 29* 29* 27*  GFRAA 33* 34* 32*  ANIONGAP 6 8 10     No results for input(s): PROT, ALBUMIN, AST, ALT, ALKPHOS, BILITOT in the last 168 hours. Hematology Recent Labs Lab 07/13/17 0246 07/14/17 0204 07/15/17 0151  WBC 10.6* 10.1 11.5*  RBC 3.30* 3.47* 3.84*  HGB 10.2* 10.4* 11.7*  HCT 30.5* 31.6* 36.0  MCV 92.4 91.1 93.8  MCH 30.9 30.0 30.5  MCHC 33.4 32.9 32.5  RDW 14.1 14.0 14.3  PLT 210 259 299   Cardiac EnzymesNo results for input(s): TROPONINI in the last 168 hours. No results for input(s): TROPIPOC in the last 168 hours.  BNPNo results for input(s): BNP, PROBNP in the last 168 hours.  DDimer No results for input(s): DDIMER in the last 168 hours.  Radiology/Studies:  No results found.  Assessment and Plan:   1. [place] - Recent NSTEMI. Plan for medical management. BP soft. Give bolus of fluids. Control pain with IV meds. Start heparin. EKG looks better then last admission. Pending labs. Consider IM admission with hospice care. MD to see.    Maria Pont, PA  07/31/2017 7:11 PM   The patient was seen and examined, and I agree with the history, physical exam, assessment and  plan as documented above, with modifications as noted below. I have also personally reviewed all relevant documentation, old records, labs, and both radiographic and cardiovascular studies. I have also independently interpreted  old and new ECG's.  83 yr old woman recently discharged on 07/15/17 who presents with sudden onset of shortness of breath while sitting on the porch with family members. During my exam she complained of intermittent right-sided chest tightness. She has severe CAD with left main, ostial circumflex, and mid LAD disease. She is neither a candidate for CABG nor PCI as it would be high risk.  ECG shows sinus rhythm with precordial T wave inversions but is improved when compared to ECG on 07/11/17.  Labs and chest xray pending.  As per Dr. York Spaniel discharge summary, "if she returns to the hospital with an acute infarction she should be treated medically and not taken to the cath lab because of the high likelihood of unsuccessful nature of an acute procedure". I discussed this with family members present in the room.  Hence, will elect to treat medically. LVEF was normal (50-55% by echo 07/11/17) so given low BP, will give 500 cc IV fluid bolus. SBP in 114 mmHg range at time of my evaluation. Can give IV heparin.  I think hospice should be consulted. Dr. Donnie Aho to see tomorrow.  Prentice Docker, MD, [facility]  26-Jul-2017 7:40 PM

## 2017-07-19 DIAGNOSIS — N184 Chronic kidney disease, stage 4 (severe): Secondary | ICD-10-CM

## 2017-07-19 DIAGNOSIS — I9589 Other hypotension: Secondary | ICD-10-CM

## 2017-07-19 DIAGNOSIS — N178 Other acute kidney failure: Secondary | ICD-10-CM

## 2017-07-19 DIAGNOSIS — Z515 Encounter for palliative care: Secondary | ICD-10-CM

## 2017-07-19 DIAGNOSIS — R570 Cardiogenic shock: Secondary | ICD-10-CM

## 2017-07-19 LAB — CBC
HEMATOCRIT: 29 % — AB (ref 36.0–46.0)
HEMOGLOBIN: 9.6 g/dL — AB (ref 12.0–15.0)
MCH: 31.1 pg (ref 26.0–34.0)
MCHC: 33.1 g/dL (ref 30.0–36.0)
MCV: 93.9 fL (ref 78.0–100.0)
Platelets: 307 10*3/uL (ref 150–400)
RBC: 3.09 MIL/uL — ABNORMAL LOW (ref 3.87–5.11)
RDW: 15 % (ref 11.5–15.5)
WBC: 12.6 10*3/uL — ABNORMAL HIGH (ref 4.0–10.5)

## 2017-07-19 LAB — TROPONIN I: Troponin I: 0.03 ng/mL (ref <0.03)

## 2017-07-19 LAB — BASIC METABOLIC PANEL
Anion gap: 7 (ref 5–15)
BUN: 22 mg/dL — AB (ref 6–20)
CHLORIDE: 111 mmol/L (ref 101–111)
CO2: 20 mmol/L — ABNORMAL LOW (ref 22–32)
Calcium: 7.8 mg/dL — ABNORMAL LOW (ref 8.9–10.3)
Creatinine, Ser: 2 mg/dL — ABNORMAL HIGH (ref 0.44–1.00)
GFR calc Af Amer: 25 mL/min — ABNORMAL LOW (ref >60)
GFR calc non Af Amer: 22 mL/min — ABNORMAL LOW (ref >60)
Glucose, Bld: 92 mg/dL (ref 65–99)
POTASSIUM: 4.5 mmol/L (ref 3.5–5.1)
SODIUM: 138 mmol/L (ref 135–145)

## 2017-07-19 LAB — HEPARIN LEVEL (UNFRACTIONATED)
HEPARIN UNFRACTIONATED: 0.14 [IU]/mL — AB (ref 0.30–0.70)
Heparin Unfractionated: 0.32 IU/mL (ref 0.30–0.70)

## 2017-07-19 MED ORDER — HEPARIN BOLUS VIA INFUSION
1000.0000 [IU] | INTRAVENOUS | Status: AC
  Administered 2017-07-19: 1000 [IU] via INTRAVENOUS
  Filled 2017-07-19: qty 1000

## 2017-07-19 MED ORDER — ALPRAZOLAM 0.5 MG PO TABS
0.5000 mg | ORAL_TABLET | Freq: Three times a day (TID) | ORAL | Status: DC
  Administered 2017-07-19 – 2017-07-20 (×3): 0.5 mg via ORAL
  Filled 2017-07-19 (×4): qty 1

## 2017-07-19 MED ORDER — DIPHENHYDRAMINE HCL 25 MG PO CAPS
25.0000 mg | ORAL_CAPSULE | Freq: Four times a day (QID) | ORAL | Status: DC | PRN
  Administered 2017-07-19 – 2017-07-20 (×2): 25 mg via ORAL
  Filled 2017-07-19 (×3): qty 1

## 2017-07-19 MED ORDER — HEPARIN BOLUS VIA INFUSION
2000.0000 [IU] | Freq: Once | INTRAVENOUS | Status: AC
  Administered 2017-07-19: 2000 [IU] via INTRAVENOUS
  Filled 2017-07-19: qty 2000

## 2017-07-19 NOTE — Progress Notes (Signed)
PROGRESS NOTE                                                                                                                                                                                                             Patient Demographics:    [name], is a 83 y.o. female, DOB - 14-Jul-1932, ZOX:[id number]  Admit date - 20-Jul-2017   Admitting Physician Hillary Bow, DO  Outpatient Primary MD for the patient is Bennie Pierini, FNP  LOS - 1  Outpatient Specialists: Dr Donnie Aho  Chief Complaint  Patient presents with  . Code STEMI  . Chest Pain       Brief Narrative  83 year old with severe coronary artery disease, history of MI with LV aneurysm and rupture status post repair in 1999, PVD, carotid artery disease status post carotid-subclavian bypass and CVA, aortofemoral bypass, AAA without rupture, hypertension, hyperlipidemia, history of CVA with visual loss An recent NSTEMI from 8/2-8/6. Patient had cardiac cath done earlier which showed severe disease and felt not a candidate for surgical intervention. Patient presented with intermittent substernal chest pain on the day of admission. EKG negative for acute findings. Blood pressure was low. Patient given IV normal saline bolus. Cardiology consulted who recommended conservative management and referred to hospice. Patient started on IV morphine drip and admitted to hospitalist service.   Subjective:   Patient reports intermittent chest pain which is controlled on current morphine drip. Also is very anxious about dying.   Assessment  & Plan :    Principal Problem:   Unstable angina pectoris due to coronary arteriosclerosis [facility]) Not a candidate for further intervention. Detailed discussion by palliative care with family. Patient is DO NOT RESUSCITATE. Goal for comfort and possibly discharge home with hospice. Palliative care with further meet with family  tomorrow morning. Continue morphine to for now. Plan to convert to suitable oral regimen prior to discharge. Added low-dose Xanax for anxiety. Holding blood pressure medications due to hypotension. Continue heparin drip for now until final goals of care discussion. Continue Plavix and statin.  Hypotension Continues to have low systolic blood pressure in the 70s to 80s. As per Dr. York Spaniel note her central blood pressure runs 30-40 points higher than a peripheral blood pressure. Of blood pressure medications.  Acute on chronic kidney disease stage 3-4 Received IV fluids in the ED.  Chest x-ray showing vascular congestion. Hold further fluids. Eventually go for comfort.     Code Status : DO NOT RESUSCITATE  Family Communication  : Brother, sister and Niece at bedside  Disposition Plan  : Pending further palliative care discussion tomorrow. Home with home hospice possibly  Barriers For Discharge : Active symptoms  Consults  :   Cardiology Palliative care  Procedures  : None  DVT Prophylaxis  :  IV heparin  Lab Results  Component Value Date   PLT 307 07/19/2017    Antibiotics  :    Anti-infectives    None        Objective:   Vitals:   07/19/17 0038 07/19/17 0405 07/19/17 0850 07/19/17 1030  BP: (!) 112/47 (!) 84/37 (!) 73/43 (!) 77/51  Pulse: 76 73    Resp: 18 18    Temp: 98.1 F (36.7 C) 98.5 F (36.9 C)    TempSrc: Oral Oral    SpO2: 96% 93%    Weight: 65.2 kg (143 lb 12.8 oz)     Height: 5\' 6"  (1.676 m)       Wt Readings from Last 3 Encounters:  07/19/17 65.2 kg (143 lb 12.8 oz)  07/15/17 65 kg (143 lb 4.8 oz)  07/05/17 65.3 kg (144 lb)     Intake/Output Summary (Last 24 hours) at 07/19/17 1236 Last data filed at 07/19/17 0759  Gross per 24 hour  Intake          1949.87 ml  Output              400 ml  Net          1549.87 ml     Physical Exam  Gen: not in distress, Anxious  HEENT:Pallor present,  moist mucosa, supple neck Chest:Diminished  breath sounds over lung bases with scattered rhonchi  CVS: N S1&S2, no murmurs, rubs or gallop GI: soft, NT, ND,  Musculoskeletal: warm, no edema    Data Review:    CBC  Recent Labs Lab 07/13/17 0246 07/14/17 0204 07/15/17 0151 Nov 17, 2017 1939 07/19/17 0349  WBC 10.6* 10.1 11.5* 10.7* 12.6*  HGB 10.2* 10.4* 11.7* 9.3* 9.6*  HCT 30.5* 31.6* 36.0 28.0* 29.0*  PLT 210 259 299 287 307  MCV 92.4 91.1 93.8 92.1 93.9  MCH 30.9 30.0 30.5 30.6 31.1  MCHC 33.4 32.9 32.5 33.2 33.1  RDW 14.1 14.0 14.3 14.4 15.0  LYMPHSABS  --   --   --  0.8  --   MONOABS  --   --   --  1.5*  --   EOSABS  --   --   --  0.6  --   BASOSABS  --   --   --  0.0  --     Chemistries   Recent Labs Lab 07/13/17 0246 07/14/17 0204 07/15/17 0151 Nov 17, 2017 1939 07/19/17 0349  NA 137 137 137 137 138  K 4.1 3.9 3.7 4.2 4.5  CL 107 107 104 111 111  CO2 24 22 23  18* 20*  GLUCOSE 117* 109* 87 83 92  BUN 26* 26* 24* 26* 22*  CREATININE 1.60* 1.59* 1.66* 2.23* 2.00*  CALCIUM 8.2* 8.6* 8.5* 7.8* 7.8*  AST  --   --   --  17  --   ALT  --   --   --  15  --   ALKPHOS  --   --   --  66  --   BILITOT  --   --   --  0.6  --    ------------------------------------------------------------------------------------------------------------------ No results for input(s): CHOL, HDL, LDLCALC, TRIG, CHOLHDL, LDLDIRECT in the last 72 hours.  No results found for: HGBA1C ------------------------------------------------------------------------------------------------------------------ No results for input(s): TSH, T4TOTAL, T3FREE, THYROIDAB in the last 72 hours.  Invalid input(s): FREET3 ------------------------------------------------------------------------------------------------------------------ No results for input(s): VITAMINB12, FOLATE, FERRITIN, TIBC, IRON, RETICCTPCT in the last 72 hours.  Coagulation profile  Recent Labs Lab 07/13/17 0246 07/14/17 0204 07/15/17 0151 08/03/2017 1939  INR 1.06 1.03 1.11 1.34     No results for input(s): DDIMER in the last 72 hours.  Cardiac Enzymes  Recent Labs Lab 07/19/17 0349 07/19/17 1057  TROPONINI <0.03 0.03*   ------------------------------------------------------------------------------------------------------------------ No results found for: BNP  Inpatient Medications  Scheduled Meds: . ALPRAZolam  0.5 mg Oral TID  . atorvastatin  40 mg Oral Daily  . clopidogrel  75 mg Oral Daily   Continuous Infusions: . heparin 800 Units/hr (07/19/17 1133)  . morphine 2 mg/hr (07/19/17 0811)   PRN Meds:.acetaminophen **OR** acetaminophen, diphenhydrAMINE, morphine, ondansetron **OR** ondansetron (ZOFRAN) IV  Micro Results Recent Results (from the past 240 hour(s))  MRSA PCR Screening     Status: None   Collection Time: 07/11/17 12:06 AM  Result Value Ref Range Status   MRSA by PCR NEGATIVE NEGATIVE Final    Comment:        The GeneXpert MRSA Assay (FDA approved for NASAL specimens only), is one component of a comprehensive MRSA colonization surveillance program. It is not intended to diagnose MRSA infection nor to guide or monitor treatment for MRSA infections.     Radiology Reports Dg Chest 2 View  Result Date: 07/10/2017 CLINICAL DATA:  Chest pain EXAM: CHEST  2 VIEW COMPARISON:  03/09/2014 FINDINGS: The heart is moderately enlarged. Left hemidiaphragm remains elevated and there is subsegmental atelectasis at the left base. Normal pulmonary vascularity. Post operative changes. No pneumothorax. IMPRESSION: Cardiomegaly without edema. Left basilar atelectasis. Electronically Signed   By: Jolaine Click M.D.   On: 07/10/2017 15:10   Ct Head Wo Contrast  Result Date: 07/10/2017 CLINICAL DATA:  Severe headache EXAM: CT HEAD WITHOUT CONTRAST TECHNIQUE: Contiguous axial images were obtained from the base of the skull through the vertex without intravenous contrast. COMPARISON:  03/10/2010 FINDINGS: Brain: Diffuse cerebral atrophy. No acute  intracranial abnormality. Specifically, no hemorrhage, hydrocephalus, mass lesion, acute infarction, or significant intracranial injury. Vascular: No hyperdense vessel or unexpected calcification. Skull: No acute calvarial abnormality. Sinuses/Orbits: Visualized paranasal sinuses and mastoids clear. Orbital soft tissues unremarkable. Other: None IMPRESSION: No acute intracranial abnormality. Electronically Signed   By: Charlett Nose M.D.   On: 07/10/2017 15:16   Nm Pulmonary Perf And Vent  Result Date: 07/10/2017 CLINICAL DATA:  83 year old female with a history of syncope EXAM: NUCLEAR MEDICINE VENTILATION - PERFUSION LUNG SCAN TECHNIQUE: Ventilation images were obtained in multiple projections using inhaled aerosol Tc-41m DTPA. Perfusion images were obtained in multiple projections after intravenous injection of Tc-63m MAA. RADIOPHARMACEUTICALS:  32.0 mCi Technetium-25m DTPA aerosol inhalation and 4.1 mCi Technetium-70m MAA IV COMPARISON:  Chest x-ray of the same date FINDINGS: Ventilation: No focal ventilation defect. Perfusion: No wedge shaped peripheral perfusion defects to suggest acute pulmonary embolism. IMPRESSION: Low probability study for pulmonary emboli. Electronically Signed   By: Gilmer Mor D.O.   On: 07/10/2017 17:45   Dg Chest Port 1 View  Result Date: 07/15/2017 CLINICAL DATA:  Acute chest pain EXAM: PORTABLE CHEST 1 VIEW COMPARISON:  07/12/2017 and prior exams FINDINGS: Cardiomegaly and CABG changes again  noted. Pulmonary vascular congestion is identified. Left basilar atelectasis/scarring again noted. There is no evidence of pleural effusion or pneumothorax. No acute bony abnormalities are present. IMPRESSION: Cardiomegaly with pulmonary vascular congestion. Electronically Signed   By: Harmon Pier M.D.   On: 08/08/2017 19:39   Dg Chest Port 1 View  Result Date: 07/12/2017 CLINICAL DATA:  Intra artery balloon pump. EXAM: PORTABLE CHEST 1 VIEW COMPARISON:  07/10/2017. FINDINGS: Prior  CABG. Heart size normal. Intra-aortic balloon pump marker noted at the level of the aortic arch. Left lower lobe atelectasis and consolidation with small left pleural effusion. Right lung is clear. Peripheral and carotid atherosclerotic vascular calcification . IMPRESSION: 1. Prior CABG. Intra-aortic balloon pump marker noted at the level of the aortic arch. 2. Left lower lobe atelectasis and consolidation with small left pleural effusion . Electronically Signed   By: Maisie Fus  Register   On: 07/12/2017 07:27    Time Spent in minutes  35   Eddie North M.D on 07/19/2017 at 12:36 PM  Between 7am to 7pm - Pager - [phone]  After 7pm go to [email or web address] - password [facility]  [facility] -  Office  [phone]

## 2017-07-19 NOTE — Progress Notes (Signed)
[facility]  Patient Status: Active (receiving services up to time of hospitalization)  [facility] is providing the following services: RN  If patient discharges after hours, please call [phone].   Maria FurnishDonna [name] 07/19/2017, 12:15 PM

## 2017-07-19 NOTE — Progress Notes (Addendum)
Palliative with patient at this time, discussing medications; will complete pt's med pass when conversation is complete as to leave room for changing plans.   Heparin gtt paused, linked IV has infiltrated and pt is c/o pain at site. IV removed. IV Consult placed for stat IV replacement. Second access is occupied by morphine gtt.

## 2017-07-19 NOTE — Progress Notes (Signed)
New Admission Note:   Arrival Method: ED bed with ED tech ([place], [place] Mental Orientation: A&O x4, ambulates Telemetry: initiated, verified Skin: open scratch marks and bleeding from scratching. Pain: chest and back Safety Measures: yellow socks/armband Family: at bedside  Orders to be reviewed and implemented. Will continue to monitor the patient. Call light has been placed within reach and bed alarm has been activated.   Mar DaringJequetta Josalynn Johndrow, RN Phone: [phone]

## 2017-07-19 NOTE — Progress Notes (Signed)
ANTICOAGULATION CONSULT NOTE - Follow Up Consult  Pharmacy Consult for Heparin Indication: chest pain/ACS  Allergies  Allergen Reactions  . Alendronate Sodium Other (See Comments)    Irritates esophagus and ulcers  . Buffered Aspirin Other (See Comments)    On blood thinners but doctor has started aspirin as stroke therapy  . Ibuprofen Other (See Comments)    Blood thinner therapy  . Prednisone Other (See Comments)    Patient states due to osteoporosis Ok if not long term    Patient Measurements: Height: 5\' 6"  (167.6 cm) Weight: 143 lb 12.8 oz (65.2 kg) IBW/kg (Calculated) : 59.3 Heparin Dosing Weight: 64.9 kg  Vital Signs: Temp: 98.2 F (36.8 C) (08/10 1957) Temp Source: Oral (08/10 1957) BP: 87/48 (08/10 1957) Pulse Rate: 84 (08/10 1957)  Labs:  Recent Labs  09/06/17 1939 07/19/17 0349 07/19/17 1057 07/19/17 1851  HGB 9.3* 9.6*  --   --   HCT 28.0* 29.0*  --   --   PLT 287 307  --   --   LABPROT 16.7*  --   --   --   INR 1.34  --   --   --   HEPARINUNFRC  --  0.32  --  0.14*  CREATININE 2.23* 2.00*  --   --   TROPONINI  --  <0.03 0.03*  --     Estimated Creatinine Clearance: 20 mL/min (A) (by C-G formula based on SCr of 2 mg/dL (H)).   Medications:  Infusions:  . heparin 800 Units/hr (07/19/17 1133)  . morphine 2 mg/hr (07/19/17 1758)    Assessment: 83 year old female on IV heparin for ACS (not a candidate for CABG or PCI).  Heparin level is low at 0.14 (dropped) after bolus and increase to 800 units/hr.   Infusion was stopped early this morning for infiltration but has been running fine since bolus without issues at 800 units/hr. Note that level was drawn ~6.5 hrs since bolus and may not be a steady state but likely still low.  Noted that patient has been scratching skin with small amount of bleeding from these sites but no overt bleeding per RN.   Goal of Therapy:  Heparin level 0.3-0.7 units/ml Monitor platelets by anticoagulation protocol:  Yes   Plan:  Re-bolus Heparin 2000 units IV x1.  Increase Heparin to 1000 units/hr. Recheck Heparin level in 8 hours.   Link SnufferJessica Arland Usery, PharmD, BCPS Clinical Pharmacist Clinical phone 07/19/2017 until 11PM [phone]- [phone] After hours, please call [phone] 07/19/2017,8:20 PM

## 2017-07-19 NOTE — Progress Notes (Signed)
Pt in much discomfort, family and palliative at bedside. 1mg  morphine bolus administered, per verbal advice from palliative NP.   Pt also provided scheduled xanax.   Maintenance medications pushed to lunch time, as her comfort has been made priority; agreed upon by family and care team.

## 2017-07-19 NOTE — Consult Note (Signed)
Consultation Note Date: 07/19/2017   Patient Name: [name]  DOB: November 15, 1932  MRN: [id number]  Age / Sex: 83 y.o., female  PCP: Chevis Pretty, FNP Referring Physician: Louellen Molder, MD  Reason for Consultation: Establishing goals of care, Hospice Evaluation, Non pain symptom management, Pain control and Psychosocial/spiritual support  HPI/Patient Profile: 83 y.o. female  with past medical history of Significant coronary artery artery disease, silent MI with LV aneurysm and rupture, status post repair 1999, on anticoagulation since 1999, peripheral vascular disease including carotid artery disease status post carotid subclavian bypass, aortofemoral bypass, AAA without  without rupture, hypertension hyperlipidemia, CVA, recent non-STEMI admission from 07/11/2017 2 07/15/2017 (catheter was done just prior to this and showed severe disease; she was not felt to be a candidate for CABG) admitted on 08/03/2017 with chest pain.  Consult was ordered for goals of care, hospice evaluation.   Clinical Assessment and Goals of Care: Met with patient, her brother and sister-in-law, 2 nieces, for goals of care meeting. Patient was started on a morphine drip at 1 mg an hour during the night because of severe chest pain with associated dyspnea. Initial drip rate has now been increased to 2 mg an hour and family reports she has been doing much better. Patient is alert and oriented 3 when I enter the room. She becomes very anxious with any changes even just getting up to go to the bathroom almost precipitates a panic attack. She begins to scratch and itch and verbalized that she is afraid. She understands that there is no further interventions to help her heart, and shares that "hospice scares me". I did elaborate on the benefits of hospice, and supported her concerns about her health.  Her brother would be her healthcare  power of attorney in the event that she could not speak for herself; her 2 nieces are very active in her care, Jackelyn Poling and Jocelyn Lamer. Patient at this point is capable of participating in goals of care    SUMMARY OF RECOMMENDATIONS   Continue DNR/DNI Spoke to attending, who is added xanax 0.5 3 times daily which should help tremendously in terms of her associated anxiety Continue morphine drip for now. We will  need to convert to oral medication before discharge home Plan is to meet family on 2017/08/04 at 9 AM to further address home with hospice. Her family seems supportive of hospice care in the home Code Status/Advance Care Planning:  DNR    Symptom Management:   Pain: Continue morphine continuous infusion at 2 mg an hour for now. We'll monitor as needed dosing and then convert to oral medication regimen on August 04, 2017. Patient's skin integrity is poor because of her degree of urticaria, healing scabs on her back, thus would recommend low-dose MS Contin versus a fentanyl patch  Dyspnea: Continue with targeted pulmonary treatments. I do think she would recommend from having oxygen in the home as well as management with opioids  Anxiety: Continue with Xanax 0.5 mg 3 times daily. Monitor and titrate for effect  Urticaria:  Continue with Benadryl as needed; hopefully Xanax will help this as well  Palliative Prophylaxis:   Aspiration, Bowel Regimen, Frequent Pain Assessment, Oral Care and Turn Reposition  Additional Recommendations (Limitations, Scope, Preferences):  Avoid Hospitalization, Initiate Comfort Feeding, No Artificial Feeding, No Chemotherapy, No Hemodialysis, No Radiation, No Surgical Procedures and No Tracheostomy  Psycho-social/Spiritual:   Desire for further Chaplaincy support:no  Additional Recommendations: Grief/Bereavement Support  Prognosis:   < 6 months in the setting of severe coronary artery disease symptomatic chest pain, non-STEMI aortic aneurysm repair, carotid  occlusion, aortofemoral bypass, chronic kidney disease stage IV  Discharge Planning: Likely  Home with Hospice      Primary Diagnoses: Present on Admission: . Unstable angina pectoris due to coronary arteriosclerosis ([place]) . CKD (chronic kidney disease) stage 4, GFR 15-29 ml/min (HCC) . AKI (acute kidney injury) ([place])   I have reviewed the medical record, interviewed the patient and family, and examined the patient. The following aspects are pertinent.  Past Medical History:  Diagnosis Date  . Aortic aneurysm ([place])   . 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 Cardiac aneurysm    ventrral - bypass   . Carotid artery occlusion   . Carotid bruit    bil carotid bruits   . Cataract   . GERD (gastroesophageal reflux disease)   . Gout   . H/O blood clots   . History of aorto-femoral bypass   . Hyperlipidemia   . Hypertension   . Lower back pain    disc   . Migraines   . Myocardial infarction ([place])   . Osteoporosis   . Pancreatitis   . Peptic ulcer    Hx   . Stricture esophagus    Hx  . Stroke [facility])    Right eye stroke  . Urethral stenosis    Social History   Social History  . Marital status: Widowed    Spouse name: N/A  . Number of children: N/A  . Years of education: N/A   Occupational History  . 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 Smoking status: Former Smoker    Quit date: 04/06/1998  . Smokeless tobacco: Never Used  . Alcohol use No  . Drug use: No  . Sexual activity: No   Other Topics Concern  . None   Social History Narrative  . None   Family History  Problem Relation Age of Onset  . CAD Father        died age 50+  . Heart disease Father        Older than 52  . Heart attack Father   . Osteoarthritis Sister        paraplegic due to disk rupture  . CAD Sister   . Arthritis Sister   . Varicose Veins Mother   . Colon cancer Neg Hx    Scheduled Meds: . ALPRAZolam  0.5 mg  Oral TID  . atorvastatin  40 mg Oral Daily  . clopidogrel  75 mg Oral Daily   Continuous Infusions: . heparin 800 Units/hr (07/19/17 1133)  . morphine 2 mg/hr (07/19/17 0811)   PRN Meds:.acetaminophen **OR** acetaminophen, diphenhydrAMINE, morphine, ondansetron **OR** ondansetron (ZOFRAN) IV Medications Prior to Admission:  Prior to Admission medications   Medication Sig Start Date End Date Taking? Authorizing Provider  amLODipine (NORVASC) 10 MG tablet Take 1 tablet (10 mg total) by mouth daily. 07/16/17  Yes Wynonia Lawman,  Grace Bushy, MD  atorvastatin (LIPITOR) 40 MG tablet Take 1 tablet (40 mg total) by mouth daily. 02/08/17  Yes Hassell Done, Mary-Shakiera, FNP  clopidogrel (PLAVIX) 75 MG tablet Take 1 tablet (75 mg total) by mouth daily. 07/16/17  Yes Jacolyn Reedy, MD  isosorbide mononitrate (IMDUR) 60 MG 24 hr tablet Take 1 tablet (60 mg total) by mouth daily. 07/16/17  Yes Jacolyn Reedy, MD  metoprolol tartrate (LOPRESSOR) 100 MG tablet Take 1 tablet (100 mg total) by mouth 2 (two) times daily. 07/15/17  Yes Jacolyn Reedy, MD  triamterene-hydrochlorothiazide (MAXZIDE-25) 37.5-25 MG tablet Take 1 tablet by mouth daily. 07/01/17  Yes [provider]  warfarin (COUMADIN) 1 MG tablet Take 1-2 tablets (1-2 mg total) by mouth daily. Patient taking differently: Take 1 mg by mouth daily.  05/28/17  Yes Cherre Robins, PharmD  HYDROcodone-acetaminophen (NORCO/VICODIN) 5-325 MG tablet Take 1 tablet by mouth every 6 (six) hours as needed. For pain Patient not taking: Reported on 07/25/2017 05/07/17   Chevis Pretty, FNP  pantoprazole (PROTONIX) 40 MG tablet Take 1 tablet (40 mg total) by mouth daily. Patient not taking: Reported on 07/29/2017 02/08/17   Chevis Pretty, FNP   Allergies  Allergen Reactions  . Alendronate Sodium Other (See Comments)    Irritates esophagus and ulcers  . Buffered Aspirin Other (See Comments)    On blood thinners but doctor has started aspirin as stroke therapy   . Ibuprofen Other (See Comments)    Blood thinner therapy  . Prednisone Other (See Comments)    Patient states due to osteoporosis Ok if not long term   Review of Systems  Constitutional: Positive for activity change, diaphoresis and fatigue.  HENT: Negative.   Eyes: Negative.   Respiratory: Positive for cough and chest tightness.   Cardiovascular: Positive for chest pain and palpitations.  Gastrointestinal: Negative.   Endocrine: Negative.   Genitourinary: Negative.   Skin:       urticaria  Allergic/Immunologic: Negative.   Neurological: Positive for weakness.  Hematological: Bruises/bleeds easily.  Psychiatric/Behavioral: The patient is nervous/anxious.     Physical Exam  Constitutional: She is oriented to person, place, and time. She appears well-developed and well-nourished.  Elderly female, complaining of chest pressure; appears very anxious and short of breath  HENT:  Head: Normocephalic and atraumatic.  Cardiovascular:  Tachycardic Hypotensive with blood pressure 88/39  Pulmonary/Chest:  Increased work of breathing at rest; no wheezing, no stridor  Abdominal: Soft.  Musculoskeletal: Normal range of motion.  Neurological: She is alert and oriented to person, place, and time.  Skin: Skin is warm and dry.  Multiple areas of healing scabs where she has been itching uncontrollably and scratching  Psychiatric:  Very anxious  Nursing note and vitals reviewed.   Vital Signs: BP (!) 77/51   Pulse 73   Temp 98.5 F (36.9 C) (Oral)   Resp 18   Ht _0  (1.676 m)   Wt 65.2 kg (143 lb 12.8 oz)   SpO2 93%   BMI 23.21 kg/m  Pain Assessment: 0-10   Pain Score: 7    SpO2: SpO2: 93 % O2 Device:SpO2: 93 % O2 Flow Rate: .   IO: Intake/output summary:  Intake/Output Summary (Last 24 hours) at 07/19/17 1133 Last data filed at 07/19/17 0759  Gross per 24 hour  Intake          1949.87 ml  Output              400 ml  Net          1549.87 ml    LBM: Last BM Date:  08/08/2017 Baseline Weight: Weight: 64.9 kg (143 lb) Most recent weight: Weight: 65.2 kg (143 lb 12.8 oz)     Palliative Assessment/Data:   Flowsheet Rows     Most Recent Value  Intake Tab  Referral Department  Hospitalist  Unit at Time of Referral  Cardiac/Telemetry Unit  Palliative Care Primary Diagnosis  Cardiac  Date Notified  07/16/2017  Palliative Care Type  Return patient Palliative Care  Reason for referral  Clarify Goals of Care, Pain, Non-pain Symptom, Counsel Regarding Hospice  Date of Admission  07/24/2017  Date first seen by Palliative Care  07/19/17  # of days Palliative referral response time  1 Day(s)  # of days IP prior to Palliative referral  0  Clinical Assessment  Palliative Performance Scale Score  50%  Pain Max last 24 hours  7  Pain Min Last 24 hours  3  Dyspnea Max Last 24 Hours  7  Dyspnea Min Last 24 hours  3  Nausea Max Last 24 Hours  0  Nausea Min Last 24 Hours  0  Anxiety Max Last 24 Hours  8  Anxiety Min Last 24 Hours  5  Other Max Last 24 Hours  0  Psychosocial & Spiritual Assessment  Palliative Care Outcomes  Patient/Family meeting held?  Yes  Who was at the meeting?  pt, her brother, SIL, 2 nieces  Palliative Care Outcomes  Improved non-pain symptom therapy, Improved pain interventions, Counseled regarding hospice, Provided psychosocial or spiritual support  Patient/Family wishes: Interventions discontinued/not started   Mechanical Ventilation, Vasopressors  Palliative Care follow-up planned  Yes, Facility      Time In: 1000 Time Out: 1110 Time Total: 70 min Greater than 50%  of this time was spent counseling and coordinating care related to the above assessment and plan. Staffed with Dr. Clementeen Graham  Signed by: Dory Horn, NP   Please contact Palliative Medicine Team phone at [phone] for questions and concerns.  For individual provider: See Shea Evans

## 2017-07-19 NOTE — Progress Notes (Signed)
Patient says her pain doesn't feel like heart pain, says it just hurts through her chest and back.

## 2017-07-19 NOTE — Progress Notes (Signed)
3e27: pt @ 2mg /hr of morphine gtt, requesting increased, systolics @ 70s. pt alert, otherwise asymptomatic. awaiting MD review prior to increase.  Page to Dr Gonzella Lexhungel

## 2017-07-19 NOTE — Progress Notes (Signed)
Family remains at bedside with patient, reports improvement in pt itching/scratching since receiving xanax. Offered benadryl as next option if it does not resolve.   Pt's skin is currently covered in scabs or bleeding due to excessive scratching, per pt and family this has been going on for 6 months.   Pt is currently on a heparin drip, increasing the bleeding with superficial scratches.

## 2017-07-19 NOTE — Progress Notes (Signed)
Asked to see palliative pt w/ comfort cart and family in rm by admin staff when on unit to see another pt. Pt's niece, her daughter, and other female friend were in rm, readily accepted opportunity to offer Ephriam Knuckles(Christian) prayer for pt. (Believe one of them said their church was [employer].)   Provided spiritual/emotional support and prayer -- which they appreciated. Chaplain available for f/u.   07/19/17 1400  Clinical Encounter Type  Visited With Patient and family together  Visit Type Initial;Psychological support;Spiritual support;Social support  Referral From Other (Comment) (Unit admin staff)  Spiritual Encounters  Spiritual Needs Prayer;Emotional  Stress Factors  Patient Stress Factors Health changes;Loss of control  Family Stress Factors Family relationships;Health changes;Loss of control   Ephraim Hamburgerynthia A Cloy Cozzens, [address]

## 2017-07-19 NOTE — Progress Notes (Signed)
3e27. "critical" Troponin 0.03. notifying per policy.   CRITICAL VALUE ALERT  Critical Value:  0.03 Troponin   Date & Time Notied:  07/19/17 1215 page sent Provider Notified: Dhungel  Orders Received/Actions taken: None.

## 2017-07-19 NOTE — Progress Notes (Signed)
ANTICOAGULATION CONSULT NOTE  Pharmacy Consult for heparin Indication: chest pain/ACS  Heparin Dosing Weight: 64.9 kg  Assessment: 85 yof with SOB, chest tightness. Pharmacy consulted to dose heparin. Not a candidate for CABG or PCI. Heparin level low therapeutic at 0.32. Pt on warfarin PTA - last dose 8/9. Hgb low-stable and platelets normal. No bleed documented.   Will increase heparin gtt slightly to keep in goal range.   Goal of Therapy:  Heparin level 0.3-0.7 units/ml Monitor platelets by anticoagulation protocol: Yes   Plan:  Increase heparin to 800 units/hr Heparin level in 8 hours  Daily heparin level/CBC Monitor s/sx bleeding  York CeriseKatherine Cook, PharmD Clinical Pharmacist 07/19/17 4:36 AM

## 2017-07-20 DIAGNOSIS — N179 Acute kidney failure, unspecified: Secondary | ICD-10-CM

## 2017-07-20 LAB — CBC
HCT: 28.5 % — ABNORMAL LOW (ref 36.0–46.0)
Hemoglobin: 9.2 g/dL — ABNORMAL LOW (ref 12.0–15.0)
MCH: 30.4 pg (ref 26.0–34.0)
MCHC: 32.3 g/dL (ref 30.0–36.0)
MCV: 94.1 fL (ref 78.0–100.0)
PLATELETS: 289 10*3/uL (ref 150–400)
RBC: 3.03 MIL/uL — AB (ref 3.87–5.11)
RDW: 14.9 % (ref 11.5–15.5)
WBC: 10.4 10*3/uL (ref 4.0–10.5)

## 2017-07-20 LAB — HEPARIN LEVEL (UNFRACTIONATED): Heparin Unfractionated: 0.26 IU/mL — ABNORMAL LOW (ref 0.30–0.70)

## 2017-07-20 MED ORDER — MORPHINE SULFATE 10 MG/5ML PO SOLN: 5.0000 mg | ORAL | Status: DC | PRN

## 2017-07-20 MED ORDER — ALPRAZOLAM 0.25 MG PO TABS
0.2500 mg | ORAL_TABLET | Freq: Three times a day (TID) | ORAL | Status: DC | PRN
  Administered 2017-07-20: 0.25 mg via ORAL
  Filled 2017-07-20: qty 1

## 2017-07-20 MED ORDER — HYDROXYZINE HCL 10 MG PO TABS
10.0000 mg | ORAL_TABLET | Freq: Three times a day (TID) | ORAL | Status: DC
  Administered 2017-07-20 (×2): 10 mg via ORAL
  Filled 2017-07-20 (×2): qty 1

## 2017-07-20 MED ORDER — HYDROXYZINE HCL 25 MG PO TABS
25.0000 mg | ORAL_TABLET | Freq: Three times a day (TID) | ORAL | Status: DC | PRN
  Administered 2017-07-20 (×2): 25 mg via ORAL
  Filled 2017-07-20 (×2): qty 1

## 2017-07-20 MED ORDER — MORPHINE SULFATE (CONCENTRATE) 10 MG/0.5ML PO SOLN
5.0000 mg | ORAL | Status: DC | PRN
  Administered 2017-07-20: 5 mg via ORAL
  Filled 2017-07-20: qty 0.5

## 2017-07-21 NOTE — Progress Notes (Signed)
Patient expired at 2240 07/22/2017. Family present. Dr. Antionette Charpyd notified. CDS notified. Patient not a candidate for tissue donation. Body prepared and ready for transport to morgue when security available to open morgue.

## 2017-07-23 NOTE — Discharge Summary (Signed)
Physician Discharge Summary  [name] ZOX:[id number] DOB: [birth year] DOA: 07/28/2017  PCP: Bennie PieriniMartin, Mary-Cattaleya, FNP  Admit date: 07/30/2017 Discharge date: 07/23/2017  Admitted From: Home   Disposition:  Patient expired on 05-11-17 at 10:40 PM.     Discharge Diagnoses:  Principal Problem:   Unstable angina pectoris due to coronary arteriosclerosis (HCC)   Active Problems:   CKD (chronic kidney disease) stage 4, GFR 15-29 ml/min (HCC)   AKI (acute kidney injury) (HCC)   Cardiogenic shock (HCC)   Palliative care by specialist  Brief narrative/history of present illness 83 year old with severe coronary artery disease, history of MI with LV aneurysm and rupture status post repair in 1999, PVD, carotid artery disease status post carotid-subclavian bypass and CVA, aortofemoral bypass, AAA without rupture, hypertension, hyperlipidemia, history of CVA with visual loss An recent NSTEMI from 8/2-8/6. Patient had cardiac cath done earlier which showed severe disease and felt not a candidate for surgical intervention. Patient presented with intermittent substernal chest pain on the day of admission. EKG negative for acute findings. Blood pressure was low. Patient given IV normal saline bolus. Cardiology consulted who recommended conservative management and referred to hospice. Patient started on IV morphine drip and admitted to hospitalist service.  Principal Problem:   Unstable angina pectoris due to coronary arteriosclerosis [facility](HCC) Not a candidate for further intervention. Detailed discussion by palliative care with family. Palliative care consult appreciated and both patient and family decided for comfort, no aggressive intervention and discharged home on home hospice. IV heparin was discontinued including most of her home medications (Plavix and statin). Patient started on IV morphine drip for comfort and dyspnea. along with scheduled Xanax for anxiety.  Patient expired in the hospital  on 8/11.  Hypotension Continues to have low systolic blood pressure in the 70s to 80s. As per Dr. York Spanielilley's note her central blood pressure runs 30-40 points higher than a peripheral blood pressure. Was taken Off  blood pressure medications.  Acute on chronic kidney disease stage 3-4 Received IV fluids in the ED. Chest x-ray showing vascular congestion.      Family Communication  :   discussed with family at bedside during hospital stay    Consults  :   Cardiology Palliative care  Procedures  : None   Discharge Instructions   Allergies as of 07/21/2017      Reactions   Alendronate Sodium Other (See Comments)   Irritates esophagus and ulcers   Buffered Aspirin Other (See Comments)   On blood thinners but doctor has started aspirin as stroke therapy   Ibuprofen Other (See Comments)   Blood thinner therapy   Prednisone Other (See Comments)   Patient states due to osteoporosis Ok if not long term              Allergies  Allergen Reactions  . Alendronate Sodium Other (See Comments)    Irritates esophagus and ulcers  . Buffered Aspirin Other (See Comments)    On blood thinners but doctor has started aspirin as stroke therapy  . Ibuprofen Other (See Comments)    Blood thinner therapy  . Prednisone Other (See Comments)    Patient states due to osteoporosis Ok if not long term       Procedures/Studies: Dg Chest 2 View  Result Date: 07/10/2017 CLINICAL DATA:  Chest pain EXAM: CHEST  2 VIEW COMPARISON:  03/09/2014 FINDINGS: The heart is moderately enlarged. Left hemidiaphragm remains elevated and there is subsegmental atelectasis at the left base. Normal pulmonary vascularity. Post  operative changes. No pneumothorax. IMPRESSION: Cardiomegaly without edema. Left basilar atelectasis. Electronically Signed   By: Jolaine Click M.D.   On: 07/10/2017 15:10   Ct Head Wo Contrast  Result Date: 07/10/2017 CLINICAL DATA:  Severe headache EXAM: CT HEAD WITHOUT CONTRAST  TECHNIQUE: Contiguous axial images were obtained from the base of the skull through the vertex without intravenous contrast. COMPARISON:  03/10/2010 FINDINGS: Brain: Diffuse cerebral atrophy. No acute intracranial abnormality. Specifically, no hemorrhage, hydrocephalus, mass lesion, acute infarction, or significant intracranial injury. Vascular: No hyperdense vessel or unexpected calcification. Skull: No acute calvarial abnormality. Sinuses/Orbits: Visualized paranasal sinuses and mastoids clear. Orbital soft tissues unremarkable. Other: None IMPRESSION: No acute intracranial abnormality. Electronically Signed   By: Charlett Nose M.D.   On: 07/10/2017 15:16   Nm Pulmonary Perf And Vent  Result Date: 07/10/2017 CLINICAL DATA:  83 year old female with a history of syncope EXAM: NUCLEAR MEDICINE VENTILATION - PERFUSION LUNG SCAN TECHNIQUE: Ventilation images were obtained in multiple projections using inhaled aerosol Tc-44m DTPA. Perfusion images were obtained in multiple projections after intravenous injection of Tc-89m MAA. RADIOPHARMACEUTICALS:  32.0 mCi Technetium-28m DTPA aerosol inhalation and 4.1 mCi Technetium-61m MAA IV COMPARISON:  Chest x-ray of the same date FINDINGS: Ventilation: No focal ventilation defect. Perfusion: No wedge shaped peripheral perfusion defects to suggest acute pulmonary embolism. IMPRESSION: Low probability study for pulmonary emboli. Electronically Signed   By: Gilmer Mor D.O.   On: 07/10/2017 17:45   Dg Chest Port 1 View  Result Date: August 09, 2017 CLINICAL DATA:  Acute chest pain EXAM: PORTABLE CHEST 1 VIEW COMPARISON:  07/12/2017 and prior exams FINDINGS: Cardiomegaly and CABG changes again noted. Pulmonary vascular congestion is identified. Left basilar atelectasis/scarring again noted. There is no evidence of pleural effusion or pneumothorax. No acute bony abnormalities are present. IMPRESSION: Cardiomegaly with pulmonary vascular congestion. Electronically Signed   By:  Harmon Pier M.D.   On: 08/09/2017 19:39   Dg Chest Port 1 View  Result Date: 07/12/2017 CLINICAL DATA:  Intra artery balloon pump. EXAM: PORTABLE CHEST 1 VIEW COMPARISON:  07/10/2017. FINDINGS: Prior CABG. Heart size normal. Intra-aortic balloon pump marker noted at the level of the aortic arch. Left lower lobe atelectasis and consolidation with small left pleural effusion. Right lung is clear. Peripheral and carotid atherosclerotic vascular calcification . IMPRESSION: 1. Prior CABG. Intra-aortic balloon pump marker noted at the level of the aortic arch. 2. Left lower lobe atelectasis and consolidation with small left pleural effusion . Electronically Signed   By: Maisie Fus  Register   On: 07/12/2017 07:27      Discharge Exam: Vitals:   07/29/2017 1322 07/17/2017 2035  BP: (!) 86/52 (!) 74/43  Pulse: 92 86  Resp: 15   Temp: 98 F (36.7 C) 98.1 F (36.7 C)  SpO2: 91% 94%   Vitals:   08/01/2017 0321 07/17/2017 1322 07/21/2017 2035 08/08/2017 2332  BP:  (!) 86/52 (!) 74/43   Pulse:  92 86   Resp: 14 15    Temp:  98 F (36.7 C) 98.1 F (36.7 C)   TempSrc:  Oral Oral   SpO2:  91% 94%   Weight:    65.2 kg (143 lb 12.8 oz)  Height:         The results of significant diagnostics from this hospitalization (including imaging, microbiology, ancillary and laboratory) are listed below for reference.     Microbiology: No results found for this or any previous visit (from the past 240 hour(s)).   Labs: BNP (last  3 results) No results for input(s): BNP in the last 8760 hours. Basic Metabolic Panel:  Recent Labs Lab 08/08/2017 1939 07/19/17 0349  NA 137 138  K 4.2 4.5  CL 111 111  CO2 18* 20*  GLUCOSE 83 92  BUN 26* 22*  CREATININE 2.23* 2.00*  CALCIUM 7.8* 7.8*   Liver Function Tests:  Recent Labs Lab 07/28/2017 1939  AST 17  ALT 15  ALKPHOS 66  BILITOT 0.6  PROT 5.0*  ALBUMIN 2.6*   No results for input(s): LIPASE, AMYLASE in the last 168 hours. No results for input(s): AMMONIA  in the last 168 hours. CBC:  Recent Labs Lab 08/02/2017 1939 07/19/17 0349 08/11/17 0355  WBC 10.7* 12.6* 10.4  NEUTROABS 7.9*  --   --   HGB 9.3* 9.6* 9.2*  HCT 28.0* 29.0* 28.5*  MCV 92.1 93.9 94.1  PLT 287 307 289   Cardiac Enzymes:  Recent Labs Lab 07/19/17 0349 07/19/17 1057  TROPONINI <0.03 0.03*   BNP: Invalid input(s): POCBNP CBG: No results for input(s): GLUCAP in the last 168 hours. D-Dimer No results for input(s): DDIMER in the last 72 hours. Hgb A1c No results for input(s): HGBA1C in the last 72 hours. Lipid Profile No results for input(s): CHOL, HDL, LDLCALC, TRIG, CHOLHDL, LDLDIRECT in the last 72 hours. Thyroid function studies No results for input(s): TSH, T4TOTAL, T3FREE, THYROIDAB in the last 72 hours.  Invalid input(s): FREET3 Anemia work up No results for input(s): VITAMINB12, FOLATE, FERRITIN, TIBC, IRON, RETICCTPCT in the last 72 hours. Urinalysis    Component Value Date/Time   COLORURINE YELLOW 07/31/2017 2234   APPEARANCEUR CLEAR 07/12/2017 2234   LABSPEC 1.005 07/22/2017 2234   PHURINE 6.0 07/25/2017 2234   GLUCOSEU NEGATIVE 07/30/2017 2234   HGBUR MODERATE (A) 07/28/2017 2234   BILIRUBINUR NEGATIVE 07/24/2017 2234   KETONESUR NEGATIVE 07/27/2017 2234   PROTEINUR NEGATIVE 08/07/2017 2234   UROBILINOGEN 0.2 10/27/2012 1754   NITRITE NEGATIVE 08/08/2017 2234   LEUKOCYTESUR MODERATE (A) 07/23/2017 2234   Sepsis Labs Invalid input(s): PROCALCITONIN,  WBC,  LACTICIDVEN Microbiology No results found for this or any previous visit (from the past 240 hour(s)).   Time coordinating discharge: < 30 minutes  SIGNED:   Eddie North, MD  [facility] 07/23/2017, 4:56 PM Pager   If 7PM-7AM, please contact night-coverage [email or web address] Password [email or web address]

## 2017-08-10 NOTE — Progress Notes (Addendum)
PROGRESS NOTE                                                                                                                                                                                                             Patient Demographics:    [name], is a 83 y.o. female, DOB - June 20, 1932, ZOX:[id number]  Admit date - 08-17-2017   Admitting Physician Hillary Bow, DO  Outpatient Primary MD for the patient is Bennie Pierini, FNP  LOS - 2  Outpatient Specialists: Dr Donnie Aho  Chief Complaint  Patient presents with  . Code STEMI  . Chest Pain       Brief Narrative  83 year old with severe coronary artery disease, history of MI with LV aneurysm and rupture status post repair in 1999, PVD, carotid artery disease status post carotid-subclavian bypass and CVA, aortofemoral bypass, AAA without rupture, hypertension, hyperlipidemia, history of CVA with visual loss An recent NSTEMI from 8/2-8/6. Patient had cardiac cath done earlier which showed severe disease and felt not a candidate for surgical intervention. Patient presented with intermittent substernal chest pain on the day of admission. EKG negative for acute findings. Blood pressure was low. Patient given IV normal saline bolus. Cardiology consulted who recommended conservative management and referred to hospice. Patient started on IV morphine drip and admitted to hospitalist service.   Subjective:   Patient appears more drowsy today.  denies any chest pain. Persistently itching over her dry skin.   Assessment  & Plan :    Principal Problem:   Unstable angina pectoris due to coronary arteriosclerosis [facility]) Not a candidate for further intervention. Detailed discussion by palliative care with family. Patient is DO NOT RESUSCITATE. Goal for comfort and possibly discharge home with hospice. Palliative care consult appreciated. Discontinue IV heparin. Continue  Plavix and statin.  Hypotension Continues to have low systolic blood pressure in the 70s to 80s. As per Dr. York Spaniel note her central blood pressure runs 30-40 points higher than a peripheral blood pressure. OfF blood pressure medications.  Acute on chronic kidney disease stage 3-4 Received IV fluids in the ED. Chest x-ray showing vascular congestion. Hold further fluids. Goal for comfort.   Goals of care Possibly home with hospice tomorrow. Discontinue IV morphine and transitioned to oral concentrated morphine solution when necessary for pain (morphine drip is making her increasingly drowsy).  Switch schedule Xanax to 3 times a day as needed. Scheduled Vistaril for itching and sleep.     Code Status : DO NOT RESUSCITATE  Family Communication  :  Niece at bedside  Disposition Plan  :Home with home hospice possibly Tomorrow  Barriers For Discharge : Active symptoms  Consults  :   Cardiology Palliative care  Procedures  : None  DVT Prophylaxis  :  Comfort measures Lab Results  Component Value Date   PLT 289 08/03/2017    Antibiotics  :    Anti-infectives    None        Objective:   Vitals:   07/19/17 1030 07/19/17 1957 07/14/2017 0245 07/12/2017 0321  BP: (!) 77/51 (!) 87/48 (!) 87/49   Pulse:  84 97   Resp:  18  14  Temp:  98.2 F (36.8 C)    TempSrc:  Oral    SpO2:  99%    Weight:      Height:        Wt Readings from Last 3 Encounters:  07/19/17 65.2 kg (143 lb 12.8 oz)  07/15/17 65 kg (143 lb 4.8 oz)  07/05/17 65.3 kg (144 lb)     Intake/Output Summary (Last 24 hours) at 07/14/2017 1155 Last data filed at 07/19/2017 1000  Gross per 24 hour  Intake           311.76 ml  Output              600 ml  Net          -288.24 ml     Physical Exam Gen.: Somnolent but arousable, scratching her extremities HEENT: Moist mucosa, supple neck Chest: Few scattered rhonchi, CVS: Normal S1 and S2, no murmurs GI: Soft, nondistended, nontender Musculoskeletal:  Diffuse dry skin rash      Data Review:    CBC  Recent Labs Lab 07/14/17 0204 07/15/17 0151 July 28, 2017 1939 07/19/17 0349 08/03/2017 0355  WBC 10.1 11.5* 10.7* 12.6* 10.4  HGB 10.4* 11.7* 9.3* 9.6* 9.2*  HCT 31.6* 36.0 28.0* 29.0* 28.5*  PLT 259 299 287 307 289  MCV 91.1 93.8 92.1 93.9 94.1  MCH 30.0 30.5 30.6 31.1 30.4  MCHC 32.9 32.5 33.2 33.1 32.3  RDW 14.0 14.3 14.4 15.0 14.9  LYMPHSABS  --   --  0.8  --   --   MONOABS  --   --  1.5*  --   --   EOSABS  --   --  0.6  --   --   BASOSABS  --   --  0.0  --   --     Chemistries   Recent Labs Lab 07/14/17 0204 07/15/17 0151 July 28, 2017 1939 07/19/17 0349  NA 137 137 137 138  K 3.9 3.7 4.2 4.5  CL 107 104 111 111  CO2 22 23 18* 20*  GLUCOSE 109* 87 83 92  BUN 26* 24* 26* 22*  CREATININE 1.59* 1.66* 2.23* 2.00*  CALCIUM 8.6* 8.5* 7.8* 7.8*  AST  --   --  17  --   ALT  --   --  15  --   ALKPHOS  --   --  66  --   BILITOT  --   --  0.6  --    ------------------------------------------------------------------------------------------------------------------ No results for input(s): CHOL, HDL, LDLCALC, TRIG, CHOLHDL, LDLDIRECT in the last 72 hours.  No results found for: HGBA1C ------------------------------------------------------------------------------------------------------------------ No results for input(s): TSH, T4TOTAL, T3FREE, THYROIDAB in the  last 72 hours.  Invalid input(s): FREET3 ------------------------------------------------------------------------------------------------------------------ No results for input(s): VITAMINB12, FOLATE, FERRITIN, TIBC, IRON, RETICCTPCT in the last 72 hours.  Coagulation profile  Recent Labs Lab 07/14/17 0204 07/15/17 0151 August 10, 2017 1939  INR 1.03 1.11 1.34    No results for input(s): DDIMER in the last 72 hours.  Cardiac Enzymes  Recent Labs Lab 07/19/17 0349 07/19/17 1057  TROPONINI <0.03 0.03*    ------------------------------------------------------------------------------------------------------------------ No results found for: BNP  Inpatient Medications  Scheduled Meds: . atorvastatin  40 mg Oral Daily  . clopidogrel  75 mg Oral Daily  . hydrOXYzine  10 mg Oral TID   Continuous Infusions:  PRN Meds:.acetaminophen **OR** acetaminophen, ALPRAZolam, hydrOXYzine, morphine, ondansetron **OR** ondansetron (ZOFRAN) IV  Micro Results Recent Results (from the past 240 hour(s))  MRSA PCR Screening     Status: None   Collection Time: 07/11/17 12:06 AM  Result Value Ref Range Status   MRSA by PCR NEGATIVE NEGATIVE Final    Comment:        The GeneXpert MRSA Assay (FDA approved for NASAL specimens only), is one component of a comprehensive MRSA colonization surveillance program. It is not intended to diagnose MRSA infection nor to guide or monitor treatment for MRSA infections.     Radiology Reports Dg Chest 2 View  Result Date: 07/10/2017 CLINICAL DATA:  Chest pain EXAM: CHEST  2 VIEW COMPARISON:  03/09/2014 FINDINGS: The heart is moderately enlarged. Left hemidiaphragm remains elevated and there is subsegmental atelectasis at the left base. Normal pulmonary vascularity. Post operative changes. No pneumothorax. IMPRESSION: Cardiomegaly without edema. Left basilar atelectasis. Electronically Signed   By: Jolaine Click M.D.   On: 07/10/2017 15:10   Ct Head Wo Contrast  Result Date: 07/10/2017 CLINICAL DATA:  Severe headache EXAM: CT HEAD WITHOUT CONTRAST TECHNIQUE: Contiguous axial images were obtained from the base of the skull through the vertex without intravenous contrast. COMPARISON:  03/10/2010 FINDINGS: Brain: Diffuse cerebral atrophy. No acute intracranial abnormality. Specifically, no hemorrhage, hydrocephalus, mass lesion, acute infarction, or significant intracranial injury. Vascular: No hyperdense vessel or unexpected calcification. Skull: No acute calvarial  abnormality. Sinuses/Orbits: Visualized paranasal sinuses and mastoids clear. Orbital soft tissues unremarkable. Other: None IMPRESSION: No acute intracranial abnormality. Electronically Signed   By: Charlett Nose M.D.   On: 07/10/2017 15:16   Nm Pulmonary Perf And Vent  Result Date: 07/10/2017 CLINICAL DATA:  83 year old female with a history of syncope EXAM: NUCLEAR MEDICINE VENTILATION - PERFUSION LUNG SCAN TECHNIQUE: Ventilation images were obtained in multiple projections using inhaled aerosol Tc-70m DTPA. Perfusion images were obtained in multiple projections after intravenous injection of Tc-44m MAA. RADIOPHARMACEUTICALS:  32.0 mCi Technetium-80m DTPA aerosol inhalation and 4.1 mCi Technetium-92m MAA IV COMPARISON:  Chest x-ray of the same date FINDINGS: Ventilation: No focal ventilation defect. Perfusion: No wedge shaped peripheral perfusion defects to suggest acute pulmonary embolism. IMPRESSION: Low probability study for pulmonary emboli. Electronically Signed   By: Gilmer Mor D.O.   On: 07/10/2017 17:45   Dg Chest Port 1 View  Result Date: 10-Aug-2017 CLINICAL DATA:  Acute chest pain EXAM: PORTABLE CHEST 1 VIEW COMPARISON:  07/12/2017 and prior exams FINDINGS: Cardiomegaly and CABG changes again noted. Pulmonary vascular congestion is identified. Left basilar atelectasis/scarring again noted. There is no evidence of pleural effusion or pneumothorax. No acute bony abnormalities are present. IMPRESSION: Cardiomegaly with pulmonary vascular congestion. Electronically Signed   By: Harmon Pier M.D.   On: 08-10-17 19:39   Dg Chest [facility] 1 View  Result  Date: 07/12/2017 CLINICAL DATA:  Intra artery balloon pump. EXAM: PORTABLE CHEST 1 VIEW COMPARISON:  07/10/2017. FINDINGS: Prior CABG. Heart size normal. Intra-aortic balloon pump marker noted at the level of the aortic arch. Left lower lobe atelectasis and consolidation with small left pleural effusion. Right lung is clear. Peripheral and carotid  atherosclerotic vascular calcification . IMPRESSION: 1. Prior CABG. Intra-aortic balloon pump marker noted at the level of the aortic arch. 2. Left lower lobe atelectasis and consolidation with small left pleural effusion . Electronically Signed   By: Maisie Fus  Register   On: 07/12/2017 07:27    Time Spent in minutes  25   Eddie North M.D on 2017-08-02 at 11:55 AM  Between 7am to 7pm - Pager - [phone]  After 7pm go to [email or web address] - password [facility]  [facility] -  Office  [phone]

## 2017-08-10 NOTE — Progress Notes (Signed)
ANTICOAGULATION CONSULT NOTE - Follow Up Consult  Pharmacy Consult for heparin Indication: USAP  Labs:  Recent Labs  07/31/2017 1939 07/19/17 0349 07/19/17 1057 07/19/17 1851 Sep 01, 2017 0355  HGB 9.3* 9.6*  --   --  9.2*  HCT 28.0* 29.0*  --   --  28.5*  PLT 287 307  --   --  289  LABPROT 16.7*  --   --   --   --   INR 1.34  --   --   --   --   HEPARINUNFRC  --  0.32  --  0.14* 0.26*  CREATININE 2.23* 2.00*  --   --   --   TROPONINI  --  <0.03 0.03*  --   --     Assessment: 83yo female remains subtherapeutic on heparin though now closer to goal.  Goal of Therapy:  Heparin level 0.3-0.7 units/ml   Plan:  Will increase heparin gtt by 1-2 units/kg/hr to 1100 units/hr and check level in 8hr.  Vernard GamblesVeronda Zadin Lange, PharmD, BCPS  2017-08-17,4:48 AM

## 2017-08-10 NOTE — Progress Notes (Signed)
Daily Progress Note   Patient Name: [name]       Date: Dec 30, 2016 DOB: [birth year]  Age: 83 y.o. MRN#: [id number] Attending Physician: Eddie Northhungel, Nishant, MD Primary Care Physician: Bennie PieriniMartin, Mary-Princes, FNP Admit Date: 07/11/2017  Reason for Consultation/Follow-up: Hospice Evaluation, Non pain symptom management, Pain control and Psychosocial/spiritual support  Subjective: Patient is more somnolent today. Her niece, Lynden AngVicky, who spent the night with her stated she slept all night. She is sitting up in bed scratching but not talking to us  Length of Stay: 2  Current Medications: Scheduled Meds:  . ALPRAZolam  0.5 mg Oral TID  . atorvastatin  40 mg Oral Daily  . clopidogrel  75 mg Oral Daily    Continuous Infusions: . heparin 1,100 Units/hr (2017-05-02 0536)  . morphine 2 mg/hr (07/19/17 1758)    PRN Meds: acetaminophen **OR** acetaminophen, diphenhydrAMINE, morphine, ondansetron **OR** ondansetron (ZOFRAN) IV  Physical Exam  Constitutional: She appears well-developed and well-nourished.  Elderly female, she is scratching, appears restless  HENT:  Head: Normocephalic and atraumatic.  Pulmonary/Chest: Effort normal.  Musculoskeletal: Normal range of motion.  Neurological:  Somnolent Less verbal  Skin: Skin is warm and dry.  Multiple areas where she has scratched, scabbed over  Psychiatric:  She is somnolent this morning, not as verbal No overt anxiety  Nursing note and vitals reviewed.           Vital Signs: BP (!) 87/49 (BP Location: Left Arm)   Pulse 97   Temp 98.2 F (36.8 C) (Oral)   Resp 14   Ht 5\' 6"  (1.676 m)   Wt 65.2 kg (143 lb 12.8 oz)   SpO2 99%   BMI 23.21 kg/m  SpO2: SpO2: 99 % O2 Device: O2 Device: Not Delivered O2 Flow Rate:    Intake/output  summary:  Intake/Output Summary (Last 24 hours) at 2017-05-02 0922 Last data filed at 2017-05-02 0247  Gross per 24 hour  Intake           311.76 ml  Output              500 ml  Net          -188.24 ml   LBM: Last BM Date: 07/19/17 Baseline Weight: Weight: 64.9 kg (143 lb) Most recent weight: Weight: 65.2 kg (143 lb 12.8 oz)  Palliative Assessment/Data:    Flowsheet Rows     Most Recent Value  Intake Tab  Referral Department  Hospitalist  Unit at Time of Referral  Cardiac/Telemetry Unit  Palliative Care Primary Diagnosis  Cardiac  Date Notified  07/19/2017  Palliative Care Type  Return patient Palliative Care  Reason for referral  Clarify Goals of Care, Pain, Non-pain Symptom, Counsel Regarding Hospice  Date of Admission  2017-07-19  Date first seen by Palliative Care  07/19/17  # of days Palliative referral response time  1 Day(s)  # of days IP prior to Palliative referral  0  Clinical Assessment  Palliative Performance Scale Score  50%  Pain Max last 24 hours  7  Pain Min Last 24 hours  3  Dyspnea Max Last 24 Hours  7  Dyspnea Min Last 24 hours  3  Nausea Max Last 24 Hours  0  Nausea Min Last 24 Hours  0  Anxiety Max Last 24 Hours  8  Anxiety Min Last 24 Hours  5  Other Max Last 24 Hours  0  Psychosocial & Spiritual Assessment  Palliative Care Outcomes  Patient/Family meeting held?  Yes  Who was at the meeting?  pt, her brother, SIL, 2 nieces  Palliative Care Outcomes  Improved non-pain symptom therapy, Improved pain interventions, Counseled regarding hospice, Provided psychosocial or spiritual support  Patient/Family wishes: Interventions discontinued/not started   Mechanical Ventilation, Vasopressors  Palliative Care follow-up planned  Yes, Facility      Patient Active Problem List   Diagnosis Date Noted  . Cardiogenic shock (HCC)   . Palliative care by specialist   . Unstable angina pectoris due to coronary arteriosclerosis (HCC) 07/19/17  . AKI (acute  kidney injury) (HCC) Jul 19, 2017  . Electrocardiogram suggestive of ST elevation myocardial infarction (STEMI) (HCC) 07/11/2017  . ST elevation myocardial infarction (STEMI) (HCC)   . Shortness of breath   . Pre-syncope 07/10/2017  . NSTEMI (non-ST elevated myocardial infarction) (HCC) 07/10/2017  . Chest pain   . Elevated troponin   . CKD (chronic kidney disease) stage 4, GFR 15-29 ml/min (HCC) 06/21/2017  . Metabolic syndrome 08/25/2015  . Esophageal obstruction due to food impaction 07/12/2015  . Peripheral vascular disease (HCC) 07/12/2015  . Old anterior myocardial infarction   . History of stroke   . Peripheral neuropathy   . AAA (abdominal aortic aneurysm) without rupture (HCC) 03/24/2013  . Chronic anticoagulation 08/07/2011  . Cerebrovascular disease 08/07/2011  . PVD (peripheral vascular disease) (HCC) 08/07/2011  . Hyperlipidemia 08/07/2011  . Ventricular aneurysm 08/07/2011  . GERD with stricture 08/07/2011  . COPD (chronic obstructive pulmonary disease) (HCC) 08/07/2011  . CAD (coronary artery disease)   . Hypertensive heart disease     Palliative Care Assessment & Plan   Patient Profile: 83 y.o. female  with past medical history of Significant coronary artery artery disease, silent MI with LV aneurysm and rupture, status post repair 1999, on anticoagulation since 1999, peripheral vascular disease including carotid artery disease status post carotid subclavian bypass, aortofemoral bypass, AAA without  without rupture, hypertension hyperlipidemia, CVA, recent non-STEMI admission from 07/11/2017 2 07/15/2017 (catheter was done just prior to this and showed severe disease; she was not felt to be a candidate for CABG) admitted on 19-Jul-2017 with chest pain.  She was started on a morphine drip secondary to severe angina and has been on 2 mg an hour. Only 2 boluses are noted overnight. No further panic attacks once starting Xanax 0.5 mg 3  times a day.    Assessment: Patient  appears very somnolent this morning, not as verbal. She continues to have severe urticaria and is sitting up in bed scratching. She is not as verbal. Angina appears improved.  Recommendations/Plan:  Anxiety: Will discontinue scheduled Xanax; continue Xanax as needed 0.25 mg 3 times a day; start scheduled Vistaril 25 mg 3 times a day (this should help not only anxiety but her severe urticaria)  Pain: We'll DC morphine drip. Start morphine concentrate on an as-needed basis and monitor for need for scheduled dosing or an extended release product. Now that we are out  of an acute pain phase, as PRN dosimng for now might help facilitate wakefulness so we can see what her new baseline is  Family verbalizing concern that she looks too weak to them to go home with hospice and is questioning short-term rehabilitation. I recommended we reevaluate this after we make some medication changes. Family was comfortable with that plan  Goals of Care and Additional Recommendations:  Limitations on Scope of Treatment: Avoid Hospitalization, No Artificial Feeding, No Blood Transfusions, No Chemotherapy, No Hemodialysis, No Radiation, No Surgical Procedures and No Tracheostomy  Code Status:    Code Status Orders        Start     Ordered   Jul 19, 2017 2258  Do not attempt resuscitation (DNR)  Continuous    Question Answer Comment  In the event of cardiac or respiratory ARREST Do not call a "code blue"   In the event of cardiac or respiratory ARREST Do not perform Intubation, CPR, defibrillation or ACLS   In the event of cardiac or respiratory ARREST Use medication by any route, position, wound care, and other measures to relive pain and suffering. May use oxygen, suction and manual treatment of airway obstruction as needed for comfort.      07/19/17 2302    Code Status History    Date Active Date Inactive Code Status Order ID Comments User Context   07/12/2017  1:27 PM 07/15/2017  5:44 PM DNR [id number]  Othella Boyer, MD Inpatient   07/10/2017 10:58 PM 07/12/2017  1:27 PM Full Code [id number]  Jodelle Red, MD Inpatient   07/10/2017  4:24 PM 07/10/2017 10:57 PM DNR [id number]  Marcos Eke, PA-C ED   07/12/2015  2:10 AM 07/12/2015  6:48 PM DNR [id number]  Standley Brooking, MD Inpatient   10/27/2012 10:23 PM 11/01/2012  3:55 PM Full Code [id number]  Sharlynn Oliphant, RN Inpatient   08/07/2011 12:12 PM 08/09/2011  1:51 PM Full Code [id number]  Christiane Ha, MD Inpatient    Advance Directive Documentation     Most Recent Value  Type of Advance Directive  Living will, Healthcare Power of Attorney  Pre-existing out of facility DNR order (yellow form or pink MOST form)  -  "MOST" Form in Place?  -       Prognosis:   < 6 months in the setting of severe coronary artery disease symptomatic chest pain, non-STEMI aortic aneurysm repair, carotid occlusion, aortofemoral bypass, chronic kidney disease stage IV    Discharge Planning:  To Be Determined  Care plan was discussed with Dr. Gonzella Lex  Thank you for allowing the Palliative Medicine Team to assist in the care of this patient.   Time In: 0900 Time Out: 0935 Total Time 35 min Prolonged Time Billed  no       Greater than 50%  of this time was spent counseling and coordinating care  related to the above assessment and plan.  Irean Hong, NP  Please contact Palliative Medicine Team phone at [phone] for questions and concerns.

## 2017-08-10 NOTE — Progress Notes (Signed)
Morphine drip discontinued.  Approximately 200ml of morphine bag wasted in sink.  Buzzy Hanristin Bradley, RN witness.

## 2017-08-10 NOTE — Progress Notes (Signed)
Subjective:  Events of the admission were reviewed.  She did well for a few days at home but developed more shortness of breath and chest pain and was brought back and.  Palliative care consult noted.  The patient very much wants to be at her own home and not in a facility.  She initially did not want palliative care and we'll see how she did at home.  Objective:  Vital Signs in the last 24 hours: BP (!) 86/52 (BP Location: Left Arm)   Pulse 92   Temp 98 F (36.7 C) (Oral)   Resp 15   Ht 5\' 6"  (1.676 m)   Wt 65.2 kg (143 lb 12.8 oz)   SpO2 91%   BMI 23.21 kg/m   Physical Exam: Somewhat lethargic elderly white female Lungs:  Clear  Cardiac:  Regular rhythm, normal S1 and S2, no S3 Extremities:  No edema present  Intake/Output from previous day: 08/10 0701 - 08/11 0700 In: 445.6 [P.O.:360; I.V.:85.6] Out: 500 [Urine:500] Weight Filed Weights   06-17-2017 1858 07/19/17 0038  Weight: 64.9 kg (143 lb) 65.2 kg (143 lb 12.8 oz)    Lab Results: Basic Metabolic Panel:  Recent Labs  16/09/9606-08-2017 1939 07/19/17 0349  NA 137 138  K 4.2 4.5  CL 111 111  CO2 18* 20*  GLUCOSE 83 92  BUN 26* 22*  CREATININE 2.23* 2.00*    CBC:  Recent Labs  06-17-2017 1939 07/19/17 0349 07/31/2017 0355  WBC 10.7* 12.6* 10.4  NEUTROABS 7.9*  --   --   HGB 9.3* 9.6* 9.2*  HCT 28.0* 29.0* 28.5*  MCV 92.1 93.9 94.1  PLT 287 307 289   Cardiac Panel (last 3 results)  Recent Labs  07/19/17 0349 07/19/17 1057  TROPONINI <0.03 0.03*    Assessment/Plan:  1.  End-stage coronary disease no good interventions available 2.  Prior anterior infarction 3.  Anemia 4.  Chronic kidney disease stage IV  Recommendations:  I would discontinue telemetry at this time.  Her desire is to be at home with support and if we can arrange this that seems to be a very good goal of care.  The scratching and itching has been present since May.       Darden PalmerW. Spencer Tilley, Jr.  MD [facility] - LeavenworthFACC Cardiology  08/06/2017,  1:41 PM

## 2017-08-10 DEATH — deceased

## 2018-02-11 IMAGING — [place] PULMONARY VENT & PERF
16 series · 16 of 16 positions shown · non-contrast
Comparison: Chest x-ray of the same date

CLINICAL DATA: 85-year-old female with a history of syncope

EXAM:
NUCLEAR MEDICINE VENTILATION - PERFUSION LUNG SCAN
TECHNIQUE: Ventilation images were obtained in multiple projections using
inhaled aerosol Pc-88m DTPA. Perfusion images were obtained in
multiple projections after intravenous injection of Pc-88m MAA.
RADIOPHARMACEUTICALS:  32.0 mCi 4echnetium-NNm DTPA aerosol
inhalation and 4.1 mCi 4echnetium-NNm MAA IV

[Series 1: ant/post vent · 4.14mm/px · 1 of 1 slices shown (1 of 2)]
[im 1/1  full-range]
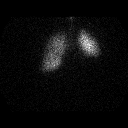

[Series 1: ant/post vent · 4.14mm/px · 1 of 1 slices shown (2 of 2)]
[im 1/1  full-range]
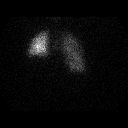

[Series 2: lao/rpo vent · 4.14mm/px · 1 of 1 slices shown (1 of 2)]
[im 1/1  full-range]
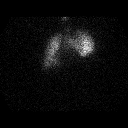

[Series 2: lao/rpo vent · 4.14mm/px · 1 of 1 slices shown (2 of 2)]
[im 1/1  full-range]
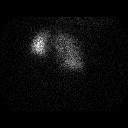

[Series 3: lpo/rao vent · 4.14mm/px · 1 of 1 slices shown (1 of 2)]
[im 1/1  full-range]
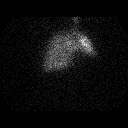

[Series 3: lpo/rao vent · 4.14mm/px · 1 of 1 slices shown (2 of 2)]
[im 1/1  full-range]
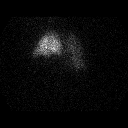

[Series 4: lt lat/rt lat vent · 4.14mm/px · 1 of 1 slices shown (1 of 2)]
[im 1/1  full-range]
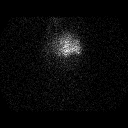

[Series 4: lt lat/rt lat vent · 4.14mm/px · 1 of 1 slices shown (2 of 2)]
[im 1/1  full-range]
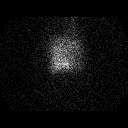

[Series 5: lt lat/rt lat perf · 4.14mm/px · 1 of 1 slices shown (1 of 2)]
[im 1/1]
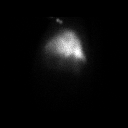

[Series 5: lt lat/rt lat perf · 4.14mm/px · 1 of 1 slices shown (2 of 2)]
[im 1/1]
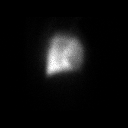

[Series 6: lpo/rao perf · 4.14mm/px · 1 of 1 slices shown (1 of 2)]
[im 1/1]
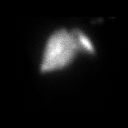

[Series 6: lpo/rao perf · 4.14mm/px · 1 of 1 slices shown (2 of 2)]
[im 1/1]
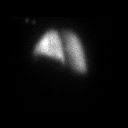

[Series 7: ant/post perf · 4.14mm/px · 1 of 1 slices shown (1 of 2)]
[im 1/1]
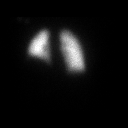

[Series 7: ant/post perf · 4.14mm/px · 1 of 1 slices shown (2 of 2)]
[im 1/1]
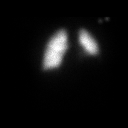

[Series 8: lao/rpo perf · 4.14mm/px · 1 of 1 slices shown (1 of 2)]
[im 1/1]
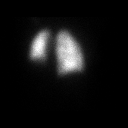

[Series 8: lao/rpo perf · 4.14mm/px · 1 of 1 slices shown (2 of 2)]
[im 1/1]
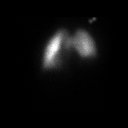

[16 of 16 positions shown; findings below may reference images not displayed]

FINDINGS: Ventilation: No focal ventilation defect.

Perfusion: No wedge shaped peripheral perfusion defects to suggest
acute pulmonary embolism.
IMPRESSION: Low probability study for pulmonary emboli.
